# Patient Record
Sex: Female | Born: 1945
Health system: Southern US, Community
[De-identification: ages and names within clinical notes are randomized; demographics above are authoritative.]

## PROBLEM LIST (undated history)

## (undated) ENCOUNTER — Emergency Department (HOSPITAL_COMMUNITY)

## (undated) DIAGNOSIS — L723 Sebaceous cyst: Secondary | ICD-10-CM

## (undated) DIAGNOSIS — E785 Hyperlipidemia, unspecified: Secondary | ICD-10-CM

## (undated) DIAGNOSIS — K589 Irritable bowel syndrome without diarrhea: Secondary | ICD-10-CM

## (undated) DIAGNOSIS — I10 Essential (primary) hypertension: Secondary | ICD-10-CM

## (undated) DIAGNOSIS — K9049 Malabsorption due to intolerance, not elsewhere classified: Secondary | ICD-10-CM

## (undated) DIAGNOSIS — R631 Polydipsia: Secondary | ICD-10-CM

## (undated) DIAGNOSIS — G453 Amaurosis fugax: Secondary | ICD-10-CM

## (undated) DIAGNOSIS — R7402 Elevation of levels of lactic acid dehydrogenase (LDH): Secondary | ICD-10-CM

## (undated) DIAGNOSIS — R7401 Elevation of levels of liver transaminase levels: Secondary | ICD-10-CM

## (undated) DIAGNOSIS — N83209 Unspecified ovarian cyst, unspecified side: Secondary | ICD-10-CM

## (undated) DIAGNOSIS — K559 Vascular disorder of intestine, unspecified: Secondary | ICD-10-CM

## (undated) DIAGNOSIS — L719 Rosacea, unspecified: Secondary | ICD-10-CM

## (undated) DIAGNOSIS — R740 Nonspecific elevation of levels of transaminase and lactic acid dehydrogenase [LDH]: Secondary | ICD-10-CM

## (undated) HISTORY — DX: Irritable bowel syndrome, unspecified: K58.9

## (undated) HISTORY — DX: Elevation of levels of lactic acid dehydrogenase (LDH): R74.02

## (undated) HISTORY — DX: Essential (primary) hypertension: I10

## (undated) HISTORY — DX: Unspecified ovarian cyst, unspecified side: N83.209

## (undated) HISTORY — DX: Nonspecific elevation of levels of transaminase and lactic acid dehydrogenase (ldh): R74.0

## (undated) HISTORY — DX: Polydipsia: R63.1

## (undated) HISTORY — PX: COLONOSCOPY: SHX174

## (undated) HISTORY — DX: Elevation of levels of liver transaminase levels: R74.01

## (undated) HISTORY — DX: Malabsorption due to intolerance, not elsewhere classified: K90.49

## (undated) HISTORY — DX: Vascular disorder of intestine, unspecified: K55.9

## (undated) HISTORY — DX: Hyperlipidemia, unspecified: E78.5

## (undated) HISTORY — DX: Rosacea, unspecified: L71.9

## (undated) HISTORY — DX: Amaurosis fugax: G45.3

## (undated) HISTORY — PX: TONSILLECTOMY AND ADENOIDECTOMY: SUR1326

## (undated) HISTORY — DX: Sebaceous cyst: L72.3

## (undated) HISTORY — DX: Elevation of levels of liver transaminase levels: R74.02

---

## 1978-01-04 HISTORY — PX: OTHER SURGICAL HISTORY: SHX169

## 1978-01-04 HISTORY — PX: APPENDECTOMY: SHX54

## 1984-01-05 HISTORY — PX: UPPER GASTROINTESTINAL ENDOSCOPY: SHX188

## 1998-11-11 ENCOUNTER — Other Ambulatory Visit: Admission: RE | Admit: 1998-11-11 | Discharge: 1998-11-11 | Payer: Self-pay | Admitting: Obstetrics and Gynecology

## 1999-05-13 ENCOUNTER — Encounter: Payer: Self-pay | Admitting: Obstetrics and Gynecology

## 1999-05-13 ENCOUNTER — Encounter: Admission: RE | Admit: 1999-05-13 | Discharge: 1999-05-13 | Payer: Self-pay | Admitting: Obstetrics and Gynecology

## 1999-12-21 ENCOUNTER — Other Ambulatory Visit: Admission: RE | Admit: 1999-12-21 | Discharge: 1999-12-21 | Payer: Self-pay | Admitting: Obstetrics and Gynecology

## 2000-08-08 ENCOUNTER — Encounter: Payer: Self-pay | Admitting: Internal Medicine

## 2000-08-08 ENCOUNTER — Encounter: Admission: RE | Admit: 2000-08-08 | Discharge: 2000-08-08 | Payer: Self-pay | Admitting: Internal Medicine

## 2000-10-03 ENCOUNTER — Ambulatory Visit (HOSPITAL_COMMUNITY): Admission: RE | Admit: 2000-10-03 | Discharge: 2000-10-03 | Payer: Self-pay | Admitting: Neurology

## 2000-10-03 ENCOUNTER — Encounter: Payer: Self-pay | Admitting: Neurology

## 2001-02-06 ENCOUNTER — Other Ambulatory Visit: Admission: RE | Admit: 2001-02-06 | Discharge: 2001-02-06 | Payer: Self-pay | Admitting: Obstetrics and Gynecology

## 2001-10-05 ENCOUNTER — Ambulatory Visit (HOSPITAL_COMMUNITY): Admission: RE | Admit: 2001-10-05 | Discharge: 2001-10-05 | Payer: Self-pay | Admitting: Gastroenterology

## 2002-02-04 ENCOUNTER — Encounter (INDEPENDENT_AMBULATORY_CARE_PROVIDER_SITE_OTHER): Payer: Self-pay | Admitting: *Deleted

## 2002-08-06 ENCOUNTER — Encounter: Payer: Self-pay | Admitting: Internal Medicine

## 2003-06-07 ENCOUNTER — Encounter: Payer: Self-pay | Admitting: Internal Medicine

## 2003-11-12 ENCOUNTER — Ambulatory Visit: Payer: Self-pay | Admitting: Internal Medicine

## 2003-11-27 ENCOUNTER — Encounter: Admission: RE | Admit: 2003-11-27 | Discharge: 2003-11-27 | Payer: Self-pay | Admitting: Internal Medicine

## 2004-03-27 ENCOUNTER — Ambulatory Visit: Payer: Self-pay | Admitting: Internal Medicine

## 2004-04-15 ENCOUNTER — Ambulatory Visit: Payer: Self-pay | Admitting: Internal Medicine

## 2004-08-19 ENCOUNTER — Ambulatory Visit: Payer: Self-pay | Admitting: Internal Medicine

## 2004-08-20 ENCOUNTER — Ambulatory Visit: Payer: Self-pay | Admitting: Internal Medicine

## 2005-04-05 ENCOUNTER — Ambulatory Visit: Payer: Self-pay | Admitting: Internal Medicine

## 2005-04-07 ENCOUNTER — Ambulatory Visit: Payer: Self-pay | Admitting: Internal Medicine

## 2005-04-12 ENCOUNTER — Ambulatory Visit: Payer: Self-pay | Admitting: Internal Medicine

## 2005-04-20 ENCOUNTER — Ambulatory Visit: Payer: Self-pay | Admitting: Gastroenterology

## 2005-11-24 ENCOUNTER — Ambulatory Visit: Payer: Self-pay | Admitting: Internal Medicine

## 2005-12-29 ENCOUNTER — Ambulatory Visit: Payer: Self-pay | Admitting: Internal Medicine

## 2006-06-07 ENCOUNTER — Encounter: Payer: Self-pay | Admitting: Internal Medicine

## 2006-09-19 ENCOUNTER — Encounter: Payer: Self-pay | Admitting: Family Medicine

## 2006-11-30 ENCOUNTER — Ambulatory Visit: Payer: Self-pay | Admitting: Internal Medicine

## 2006-11-30 DIAGNOSIS — T887XXA Unspecified adverse effect of drug or medicament, initial encounter: Secondary | ICD-10-CM | POA: Insufficient documentation

## 2006-11-30 DIAGNOSIS — L719 Rosacea, unspecified: Secondary | ICD-10-CM | POA: Insufficient documentation

## 2006-11-30 DIAGNOSIS — H3400 Transient retinal artery occlusion, unspecified eye: Secondary | ICD-10-CM | POA: Insufficient documentation

## 2006-11-30 DIAGNOSIS — R740 Nonspecific elevation of levels of transaminase and lactic acid dehydrogenase [LDH]: Secondary | ICD-10-CM

## 2006-11-30 DIAGNOSIS — N83209 Unspecified ovarian cyst, unspecified side: Secondary | ICD-10-CM

## 2006-11-30 DIAGNOSIS — L608 Other nail disorders: Secondary | ICD-10-CM | POA: Insufficient documentation

## 2006-11-30 DIAGNOSIS — K589 Irritable bowel syndrome without diarrhea: Secondary | ICD-10-CM

## 2006-11-30 DIAGNOSIS — I10 Essential (primary) hypertension: Secondary | ICD-10-CM

## 2006-11-30 DIAGNOSIS — R631 Polydipsia: Secondary | ICD-10-CM

## 2006-12-10 LAB — CONVERTED CEMR LAB
ALT: 27 units/L (ref 0–35)
AST: 27 units/L (ref 0–37)
Albumin: 3.8 g/dL (ref 3.5–5.2)
Alkaline Phosphatase: 77 units/L (ref 39–117)
BUN: 10 mg/dL (ref 6–23)
Bilirubin, Direct: 0.1 mg/dL (ref 0.0–0.3)
Cholesterol: 241 mg/dL (ref 0–200)
Creatinine, Ser: 0.8 mg/dL (ref 0.4–1.2)
Direct LDL: 162.7 mg/dL
HDL: 70.8 mg/dL (ref >39.0)
Hgb A1c MFr Bld: 5.5 % (ref 4.6–6.0)
Potassium: 4.1 meq/L (ref 3.5–5.1)
TSH: 1.79 microintl units/mL (ref 0.35–5.50)
Total Bilirubin: 0.7 mg/dL (ref 0.3–1.2)
Total CHOL/HDL Ratio: 3.4
Total Protein: 6.9 g/dL (ref 6.0–8.3)
Triglycerides: 61 mg/dL (ref 0–149)
VLDL: 12 mg/dL (ref 0–40)

## 2006-12-12 ENCOUNTER — Encounter (INDEPENDENT_AMBULATORY_CARE_PROVIDER_SITE_OTHER): Payer: Self-pay | Admitting: *Deleted

## 2006-12-13 ENCOUNTER — Encounter (INDEPENDENT_AMBULATORY_CARE_PROVIDER_SITE_OTHER): Payer: Self-pay | Admitting: *Deleted

## 2006-12-13 DIAGNOSIS — E785 Hyperlipidemia, unspecified: Secondary | ICD-10-CM

## 2006-12-13 DIAGNOSIS — E7800 Pure hypercholesterolemia, unspecified: Secondary | ICD-10-CM | POA: Insufficient documentation

## 2007-01-27 ENCOUNTER — Emergency Department (HOSPITAL_COMMUNITY): Admission: EM | Admit: 2007-01-27 | Discharge: 2007-01-27 | Payer: Self-pay | Admitting: Emergency Medicine

## 2007-04-21 ENCOUNTER — Ambulatory Visit: Payer: Self-pay | Admitting: Internal Medicine

## 2007-04-26 LAB — CONVERTED CEMR LAB
Albumin: 3.9 g/dL (ref 3.5–5.2)
Alkaline Phosphatase: 72 units/L (ref 39–117)
BUN: 8 mg/dL (ref 6–23)
Calcium: 9.3 mg/dL (ref 8.4–10.5)
Cholesterol: 226 mg/dL (ref 0–200)
Creatinine, Ser: 0.8 mg/dL (ref 0.4–1.2)
Direct LDL: 145.6 mg/dL
Eosinophils Absolute: 0.1 10*3/uL (ref 0.0–0.7)
Eosinophils Relative: 2.1 % (ref 0.0–5.0)
GFR calc Af Amer: 94 mL/min
GFR calc non Af Amer: 78 mL/min
Glucose, Bld: 85 mg/dL (ref 70–99)
HCT: 41.1 % (ref 36.0–46.0)
Hemoglobin: 14.2 g/dL (ref 12.0–15.0)
MCV: 90.7 fL (ref 78.0–100.0)
Monocytes Absolute: 0.4 10*3/uL (ref 0.1–1.0)
Monocytes Relative: 7.5 % (ref 3.0–12.0)
Neutro Abs: 3.1 10*3/uL (ref 1.4–7.7)
Platelets: 232 10*3/uL (ref 150–400)
Potassium: 4.1 meq/L (ref 3.5–5.1)
TSH: 2.6 microintl units/mL (ref 0.35–5.50)
Total Protein: 6.8 g/dL (ref 6.0–8.3)
Triglycerides: 104 mg/dL (ref 0–149)
WBC: 5.3 10*3/uL (ref 4.5–10.5)

## 2008-01-05 DIAGNOSIS — K559 Vascular disorder of intestine, unspecified: Secondary | ICD-10-CM

## 2008-01-05 HISTORY — DX: Vascular disorder of intestine, unspecified: K55.9

## 2008-06-05 ENCOUNTER — Telehealth (INDEPENDENT_AMBULATORY_CARE_PROVIDER_SITE_OTHER): Payer: Self-pay | Admitting: *Deleted

## 2008-07-19 ENCOUNTER — Ambulatory Visit: Payer: Self-pay | Admitting: Internal Medicine

## 2008-07-19 DIAGNOSIS — R9431 Abnormal electrocardiogram [ECG] [EKG]: Secondary | ICD-10-CM | POA: Insufficient documentation

## 2008-07-19 LAB — CONVERTED CEMR LAB
Cholesterol, target level: 200 mg/dL
LDL Goal: 130 mg/dL

## 2008-07-20 LAB — CONVERTED CEMR LAB
AST: 26 units/L (ref 0–37)
Albumin: 4.2 g/dL (ref 3.5–5.2)
Alkaline Phosphatase: 72 units/L (ref 39–117)
Basophils Relative: 0.5 % (ref 0.0–3.0)
CO2: 30 meq/L (ref 19–32)
Calcium: 9.2 mg/dL (ref 8.4–10.5)
Chloride: 107 meq/L (ref 96–112)
Direct LDL: 128.3 mg/dL
Eosinophils Absolute: 0.1 10*3/uL (ref 0.0–0.7)
Glucose, Bld: 82 mg/dL (ref 70–99)
HCT: 40.5 % (ref 36.0–46.0)
Hemoglobin: 13.7 g/dL (ref 12.0–15.0)
Lymphocytes Relative: 27.5 % (ref 12.0–46.0)
Lymphs Abs: 1.7 10*3/uL (ref 0.7–4.0)
MCHC: 33.9 g/dL (ref 30.0–36.0)
Neutro Abs: 3.7 10*3/uL (ref 1.4–7.7)
Potassium: 3.9 meq/L (ref 3.5–5.1)
RBC: 4.35 M/uL (ref 3.87–5.11)
RDW: 12.9 % (ref 11.5–14.6)
Sodium: 143 meq/L (ref 135–145)
TSH: 1.67 microintl units/mL (ref 0.35–5.50)
Total CHOL/HDL Ratio: 3
Total Protein: 6.9 g/dL (ref 6.0–8.3)
Triglycerides: 73 mg/dL (ref 0.0–149.0)

## 2008-07-22 ENCOUNTER — Encounter (INDEPENDENT_AMBULATORY_CARE_PROVIDER_SITE_OTHER): Payer: Self-pay | Admitting: *Deleted

## 2008-08-09 ENCOUNTER — Encounter: Admission: RE | Admit: 2008-08-09 | Discharge: 2008-08-09 | Payer: Self-pay | Admitting: Obstetrics and Gynecology

## 2008-08-22 ENCOUNTER — Encounter: Payer: Self-pay | Admitting: Internal Medicine

## 2009-01-02 ENCOUNTER — Telehealth (INDEPENDENT_AMBULATORY_CARE_PROVIDER_SITE_OTHER): Payer: Self-pay | Admitting: *Deleted

## 2009-01-02 ENCOUNTER — Ambulatory Visit: Payer: Self-pay | Admitting: Family Medicine

## 2009-01-04 ENCOUNTER — Telehealth: Payer: Self-pay | Admitting: Family Medicine

## 2009-06-12 ENCOUNTER — Ambulatory Visit: Payer: Self-pay | Admitting: Internal Medicine

## 2009-06-12 DIAGNOSIS — R635 Abnormal weight gain: Secondary | ICD-10-CM | POA: Insufficient documentation

## 2009-07-09 ENCOUNTER — Telehealth: Payer: Self-pay | Admitting: Internal Medicine

## 2009-07-10 ENCOUNTER — Telehealth: Payer: Self-pay | Admitting: Internal Medicine

## 2009-07-15 ENCOUNTER — Encounter: Payer: Self-pay | Admitting: Internal Medicine

## 2009-07-21 ENCOUNTER — Ambulatory Visit: Payer: Self-pay | Admitting: Internal Medicine

## 2009-07-21 DIAGNOSIS — J310 Chronic rhinitis: Secondary | ICD-10-CM

## 2009-07-28 LAB — CONVERTED CEMR LAB
ALT: 22 units/L (ref 0–35)
Albumin: 4 g/dL (ref 3.5–5.2)
Basophils Relative: 1.1 % (ref 0.0–3.0)
Bilirubin, Direct: 0.1 mg/dL (ref 0.0–0.3)
CO2: 29 meq/L (ref 19–32)
Chloride: 110 meq/L (ref 96–112)
Eosinophils Relative: 3.3 % (ref 0.0–5.0)
HCT: 39.5 % (ref 36.0–46.0)
Hemoglobin: 13.7 g/dL (ref 12.0–15.0)
Hgb A1c MFr Bld: 5.5 % (ref 4.6–6.5)
Lymphs Abs: 1.4 10*3/uL (ref 0.7–4.0)
MCV: 91.7 fL (ref 78.0–100.0)
Monocytes Absolute: 0.5 10*3/uL (ref 0.1–1.0)
Neutro Abs: 2.8 10*3/uL (ref 1.4–7.7)
Potassium: 4.6 meq/L (ref 3.5–5.1)
RBC: 4.31 M/uL (ref 3.87–5.11)
TSH: 2.55 microintl units/mL (ref 0.35–5.50)
Total CHOL/HDL Ratio: 4
Total Protein: 6.6 g/dL (ref 6.0–8.3)
VLDL: 27.6 mg/dL (ref 0.0–40.0)
WBC: 5 10*3/uL (ref 4.5–10.5)

## 2009-10-22 ENCOUNTER — Telehealth: Payer: Self-pay | Admitting: Internal Medicine

## 2010-01-25 ENCOUNTER — Encounter: Payer: Self-pay | Admitting: Internal Medicine

## 2010-02-03 NOTE — Progress Notes (Signed)
  Phone Note Call from Patient Call back at [facility] Phone [phone]   Caller: Patient Summary of Call: Pt has has progressive "sinus" symptoms of nasal congestion, facial pain, purulent and bloody nasal d/c.  Seen in office 01-02-09 and given rx for Augmentin which she filled the next day.  Within 2-3 hours after taking developed nausea and vomiting and mild cramping.  Did not start the Zoloft yet.  Requesting change of Ab.  Has tolerated Z-max and Levaquin in past.  Reported allergy to sulfa and cephalosporins in past.  Will d/c Augmentin and change rx to Z-max called in to [facility] [phone]. Initial call taken by: Evelena Peat MD,  January 04, 2009 10:05 AM    New/Updated Medications: AZITHROMYCIN 250 MG TABS (AZITHROMYCIN) 2 by mouth today then one by mouth once daily for 4 days

## 2010-02-03 NOTE — Progress Notes (Signed)
Summary: Xyzal PA  Phone Note Refill Request Call back at [phone] Message from:  Pharmacy on July 10, 2009 5:00 PM  Refills Requested: Medication #1:  XYZAL 5 MG TABS 1 by mouth once daily.   Dosage confirmed as above?Dosage Confirmed   Supply Requested: 1 month PRIOR AUTH NEEDED:[phone]  Next Appointment Scheduled: JULY 18TH 2011 Initial call taken by: Lavell Islam,  July 10, 2009 5:00 PM  Follow-up for Phone Call        I called pharmacy and requested Auth paper to be faxed to [phone], spoke with [facility] and [employer]  Case ID # [id number] Follow-up by: Shonna Chock,  July 14, 2009 5:23 PM  Additional Follow-up for Phone Call Additional follow up Details #1::        Form completed and faxed, will awit ins co reply. Lucious Groves  July 15, 2009 9:26 AM      Appended Document: Xyzal PA Approved until 2012.

## 2010-02-03 NOTE — Assessment & Plan Note (Signed)
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  History of Present Illness: [facility] states she is is obese based on  CDC data; BMI must be < 24.9 to get preferred  rates. If not @ that goal must be on weight reduction program. Now on modified Atkins & calorie counting  with 10 # weight loss since 02/2009. CVE as walking 30 minqd or >.  Allergies: 1)  ! Sulfa 2)  ! * Lamisil 3)  ! * Guaifenesin 4)  ! * Verelan 5)  ! Augmentin 6)  ! Zocor 7)  ! Cephalosporins 8)  ! * Zetia  Family History: Family History Diabetes 1st degree relative sister,pgm,father,mgf Family History Lung cancer M  aunt Family History of Stroke ,dementia  pgf  2 M aunts MI; 1 M aunt CHF; all 3 M aunts MI pre 69 Father: MI @ 63, DM, dementia Mother: skin cancer, basal cell, HTN, MI/angioplasty @ 55, abnormal liver imaging Siblings: sister DM, 2 sisters obesity; 1 sister : thyroid nodules; bro: PTH nodule, prostate cancer  Review of Systems CV:  Denies chest pain or discomfort, leg cramps with exertion, palpitations, and shortness of breath with exertion.  Physical Exam  General:  well-nourished; alert,appropriate and cooperative throughout examination Lungs:  Normal respiratory effort, chest expands symmetrically. Lungs are clear to auscultation, no crackles or  wheezes. Heart:  Normal rate and regular rhythm. S1 and S2 normal without gallop, murmur, click, rub or other extra sounds. Abdomen:  Waist 43 inches Neurologic:  alert & oriented X3 and DTRs symmetrical and normal.   Skin:  Intact without suspicious lesions or rashes   Impression & Recommendations:  Problem # 1:  ABNORMAL WEIGHT GAIN (ICD-783.1)  Complete Medication List: 1)  Altace 5 Mg Tabs (Ramipril) .Marland Kitchen.. 1 by mouth two times a day 2)  Benadryl  .... As needed allergies 3)  Toprol Xl 50 Mg Tb24 (Metoprolol succinate) .Marland Kitchen.. 1 by mouth qd 4)  Excedrin Extra Strength 250-250-65 Mg Tabs (Aspirin-acetaminophen-caffeine)  Patient Instructions: 1)  Consume LESS THAN 25 grams of "sugar" / day from foods & drinks with High Fructose Corn Syrup as discussed. Complex brown carbs are preferable to hyperglycemic white carbs.

## 2010-02-03 NOTE — Progress Notes (Signed)
Summary: Kimberly Stephenson  Phone Note Call from Patient   Caller: Patient Summary of Call: PT CALLS AND SAYS THAT SHE WAS SEEN BY DR. Kamarii Buren IN JANUARY FOR BRONCHITIS. SHE WAS GIVEN SAMPLES OF XYZAL 5 MG. SHE DID NOT TAKE THE SAMPLES IN JANUARY BUT HAS TAKEN THEM OVER THE PAST 2 WEEKS AND THEY SEEM TO HELP HER WITH HER ALLERGIES. SHE IS REQUESTING A RX FOR THIS MEDICATION. PT HAS AN APPT ON JULY 18TH 2011 FOR A CPX. PLEASE ADVISE. PT CAN BE CONTACTED AT [phone]. Initial call taken by: Lavell Islam,  July 09, 2009 9:11 AM  Follow-up for Phone Call        OK # 30. Note : insurance may not cover. please check on coupon or discount card in closet. Follow-up by: Marga Melnick MD,  July 09, 2009 1:11 PM  Additional Follow-up for Phone Call Additional follow up Details #1::        Left message on VM informing patient of Dr.Jadarious Dobbins's instruction. Patient to call if discount card needed Additional Follow-up by: Shonna Chock,  July 10, 2009 4:33 PM    New/Updated Medications: XYZAL 5 MG TABS (LEVOCETIRIZINE DIHYDROCHLORIDE) 1 by mouth once daily Prescriptions: XYZAL 5 MG TABS (LEVOCETIRIZINE DIHYDROCHLORIDE) 1 by mouth once daily  #30 x 0   Entered by:   Shonna Chock   Authorized by:   Marga Melnick MD   Signed by:   Shonna Chock on 07/10/2009   Method used:   Electronically to        [facility]. [phone]* (retail)       [address]       [place], [place]  [address]       Ph: [phone]       Fax: [phone]   RxID:   [id number]

## 2010-02-03 NOTE — Progress Notes (Signed)
Summary: RX call  Phone Note From Pharmacy   Summary of Call: Pharmacy called about patient Ramipril. RX was given verbally. Initial call taken by: Lucious Groves CMA,  October 22, 2009 10:30 AM

## 2010-02-03 NOTE — Assessment & Plan Note (Signed)
Summary: cpx//pt will be fasting//lch   Vital Signs:  Patient profile:   65 year old female Height:      60 inches Weight:      164.2 pounds Temp:     98.4 degrees F oral Pulse rate:   72 / minute Resp:     14 per minute BP sitting:   110 / 70  (left arm) Cuff size:   regular  Vitals Entered By: Shonna Chock CMA (July 21, 2009 8:14 AM) CC: CPX with fasting labs , Lipid Management Comments **Patient refused to update TDaP vaccine, no vaccine on file**   Primary Care Provider:  Alwyn Ren  CC:  CPX with fasting labs  and Lipid Management.  History of Present Illness: Ms. Dardis is here for a physical; she has had nasal congestion for which she has been using Claritin -D. Risks of decongestants discussed.  Lipid Management History:      Positive NCEP/ATP III risk factors include female age 39 years old or older, family history for ischemic heart disease (females less than 5 years old), and hypertension.  Negative NCEP/ATP III risk factors include non-diabetic, HDL cholesterol greater than 60, non-tobacco-user status, no ASHD (atherosclerotic heart disease), no prior stroke/TIA, no peripheral vascular disease, and no history of aortic aneurysm.     Preventive Screening-Counseling & Management  Alcohol-Tobacco     Smoking Status: quit > 6 months     Packs/Day: 2.0     Year Started: 1969     Year Quit: 1971  Current Medications (verified): 1)  Altace 5 Mg  Tabs (Ramipril) .Marland Kitchen.. 1 By Mouth Two Times A Day 2)  Benadryl .... As Needed Allergies 3)  Toprol Xl 50 Mg  Tb24 (Metoprolol Succinate) .Marland Kitchen.. 1 By Mouth Qd 4)  Excedrin Extra Strength 250-250-65 Mg Tabs (Aspirin-Acetaminophen-Caffeine)  Allergies: 1)  ! Sulfa 2)  ! * Lamisil 3)  ! * Guaifenesin 4)  ! * Verelan 5)  ! Augmentin 6)  ! Zocor 7)  ! Cephalosporins 8)  ! * Zetia  Past History:  Past Medical History: HYPERLIPIDEMIA (ICD-272.4): [place]amingham Study LDL goal = < 130. NONSPEC ELEVATION OF LEVELS OF TRANSAMINASE/LDH  (ICD-790.4) POLYDIPSIA (ICD-783.5) OVARIAN CYST (ICD-620.2),PMH of  AMAUROSIS FUGAX (ICD-362.34) X 3, last 2001 * WHEAT INTOLERANCE IRRITABLE BOWEL SYNDROME (ICD-564.1) ROSACEA (ICD-695.3) HYPERTENSION (ICD-401.9) Ischemic Colitis 08/2009, Dr Kinnie Scales  Past Surgical History: 2 C -sections; G2 P2 T & A Peri ovarian cyst right resected & appendectomy 1980 Endoscopy : negative 1986 Colonoscopy :negative  2003,  Dr Medoff;Flex Sig 08/2008 : Ischemic Colitis   Family History:  sister,PGM ,father,MGF : DM  M  aunt:CAD,lung cancer; PGF : Stroke ,dementia  ; 2 M aunts MI pre 62; 1 M aunt CHF;  Father: MI @ 22, DM, dementia Mother:  basal cell skin cancer, HTN, MI/angioplasty @ 60, abnormal liver imaging; died of small cell lung cancer Siblings: sister: DM, 2 sisters: obesity; 1 sister : thyroid nodules; Nephew: PTH nodule, prostate cancer  Social History: Former Smoker quit 1971 Alcohol use-rarely [employer] Occupation: Runner, broadcasting/film/video Regular exercise-yes: walks 30 min daily  Packs/Day:  2.0 Smoking Status:  quit > 6 months  Review of Systems  The patient denies anorexia, fever, weight loss, weight gain, vision loss, decreased hearing, chest pain, syncope, dyspnea on exertion, peripheral edema, abdominal pain, melena, hematochezia, severe indigestion/heartburn, hematuria, incontinence, suspicious skin lesions, depression, unusual weight change, abnormal bleeding, enlarged lymph nodes, and angioedema.         Occasional frontally usually  in am ; Excedrin helps . ENT:  Complains of nasal congestion and postnasal drainage; Hoarseness from PNDrainage; PND is perennial. Resp:  Complains of cough; denies shortness of breath and wheezing; Sputum from PNDrainage. Allergy:  Complains of itching eyes, seasonal allergies, and sneezing; Ragweed issues in Fall.  Physical Exam  General:  well-nourished; alert,appropriate and cooperative throughout examination Head:  Normocephalic and atraumatic  without obvious abnormalities.  Eyes:  No corneal or conjunctival inflammation noted. Perrla. Funduscopic exam benign, without hemorrhages, exudates or papilledema. Ears:  External ear exam shows no significant lesions or deformities.  Otoscopic examination reveals clear canals, tympanic membranes are intact bilaterally without bulging, retraction, inflammation or discharge. Hearing is grossly normal bilaterally. Nose:  External nasal examination shows no deformity or inflammation. Nasal mucosa are pink and moist without lesions or exudates. Mouth:  Oral mucosa and oropharynx without lesions or exudates.  Teeth in good repair. Neck:  No deformities, masses, or tenderness noted. Lungs:  Normal respiratory effort, chest expands symmetrically. Lungs are clear to auscultation, no crackles or wheezes. Heart:  Normal rate and regular rhythm. S1 and S2 normal without gallop, murmur, click, rub .S4 Abdomen:  Bowel sounds positive,abdomen soft and non-tender without masses, organomegaly or hernias noted. Msk:  No deformity or scoliosis noted of thoracic or lumbar spine.   Pulses:  R and L carotid,radial,dorsalis pedis and posterior tibial pulses are full and equal bilaterally Extremities:  No clubbing, cyanosis, edema, or deformity noted with normal full range of motion of all joints.   Neurologic:  alert & oriented X3 and DTRs symmetrical and normal.   Skin:  Intact without suspicious lesions or rashes Cervical Nodes:  No lymphadenopathy noted Axillary Nodes:  No palpable lymphadenopathy Psych:  memory intact for recent and remote, normally interactive, good eye contact, not anxious appearing, and not depressed appearing.     Impression & Recommendations:  Problem # 1:  ROUTINE GENERAL MEDICAL EXAM@HEALTH  CARE FACL (ICD-V70.0)  Orders: EKG w/ Interpretation (93000) Venipuncture (91478) TLB-Lipid Panel (80061-LIPID) TLB-BMP (Basic Metabolic Panel-BMET) (80048-METABOL) TLB-CBC Platelet -  w/Differential (85025-CBCD) TLB-Hepatic/Liver Function Pnl (80076-HEPATIC) TLB-TSH (Thyroid Stimulating Hormone) (84443-TSH) TLB-A1C / Hgb A1C (Glycohemoglobin) (83036-A1C)  Problem # 2:  RHINITIS (ICD-472.0)  Both seasonal & perennial  Orders: Venipuncture (29562)  Problem # 3:  HYPERLIPIDEMIA (ICD-272.4)  Orders: Venipuncture (13086) TLB-Lipid Panel (80061-LIPID)  Problem # 4:  HYPERTENSION (ICD-401.9)  Her updated medication list for this problem includes:    Altace 5 Mg Tabs (Ramipril) .Marland Kitchen... 1 by mouth two times a day    Toprol Xl 50 Mg Tb24 (Metoprolol succinate) .Marland Kitchen... 1 by mouth qd  Orders: EKG w/ Interpretation (93000) Venipuncture (57846)  Complete Medication List: 1)  Altace 5 Mg Tabs (Ramipril) .Marland Kitchen.. 1 by mouth two times a day 2)  Benadryl  .... As needed allergies 3)  Toprol Xl 50 Mg Tb24 (Metoprolol succinate) .Marland Kitchen.. 1 by mouth qd 4)  Excedrin Extra Strength 250-250-65 Mg Tabs (Aspirin-acetaminophen-caffeine)  Lipid Assessment/Plan:      Based on NCEP/ATP III, the patient's risk factor category is "2 or more risk factors and a calculated 10 year CAD risk of > 20%".  The patient's lipid goals are as follows: Total cholesterol goal is 200; LDL cholesterol goal is 130; HDL cholesterol goal is 40; Triglyceride goal is 150.  Her LDL cholesterol goal has been met.    Patient Instructions: 1)  It is important that you exercise regularly at least 20 minutes 5 times a week. If you develop chest  pain, have severe difficulty breathing, or feel very tired , stop exercising immediately and seek medical attention.Take an 81 mg  coated Aspirin every day.Check your Blood Pressure regularly. If it is above:135/85 ON AVERAGE you should make an appointment. Neti pot once daily as needed for nasal congestion. Prescriptions: TOPROL XL 50 MG  TB24 (METOPROLOL SUCCINATE) 1 by mouth qd  #90 x 3   Entered and Authorized by:   Marga Melnick MD   Signed by:   Marga Melnick MD on  07/21/2009   Method used:   Print then Give to Patient   RxID:   [id number] ALTACE 5 MG  TABS (RAMIPRIL) 1 by mouth two times a day  #90 x 3   Entered and Authorized by:   Marga Melnick MD   Signed by:   Marga Melnick MD on 07/21/2009   Method used:   Print then Give to Patient   RxID:   [id number]     Appended Document: cpx//pt will be fasting//lch     Clinical Lists Changes  Orders: Added new Service order of Specimen Handling (32440) - Signed

## 2010-02-03 NOTE — Medication Information (Signed)
Summary: Approval for Levocetirizine/[facility]  Approval for Levocetirizine/[employer]   Imported By: Lanelle Bal 07/29/2009 09:14:58  _____________________________________________________________________  External Attachment:    Type:   Image     Comment:   External Document

## 2010-02-03 NOTE — Medication Information (Signed)
Summary: Prior Authorization for Levocetirizine Dihydro/[facility]  Prior Authorization for Levocetirizine Dihydro/[facility]   Imported By: Lanelle Bal 07/21/2009 13:38:08  _____________________________________________________________________  External Attachment:    Type:   Image     Comment:   External Document

## 2010-05-19 ENCOUNTER — Ambulatory Visit: Payer: Self-pay | Admitting: Internal Medicine

## 2010-05-23 ENCOUNTER — Other Ambulatory Visit: Payer: Self-pay | Admitting: Internal Medicine

## 2010-06-20 ENCOUNTER — Encounter: Payer: Self-pay | Admitting: Internal Medicine

## 2010-06-20 ENCOUNTER — Ambulatory Visit (INDEPENDENT_AMBULATORY_CARE_PROVIDER_SITE_OTHER): Payer: BC Managed Care – PPO | Admitting: Family Medicine

## 2010-06-20 ENCOUNTER — Encounter: Payer: Self-pay | Admitting: Family Medicine

## 2010-06-20 DIAGNOSIS — R040 Epistaxis: Secondary | ICD-10-CM | POA: Insufficient documentation

## 2010-06-20 NOTE — Patient Instructions (Addendum)
When you use nasal saline - cut the salt component down by 1/2  Aspirin does cause you to bleed more easily  Use a bit of vaseline in nostrils 1-2 times per day to help heal the sore in left nostril and keep nostrils moist  If the nosebleeds continue or other symptoms - follow up with your regular doctor For a nosebleed- pinch the nose septum firmly for 15 minutes without letting go -- and then if still bleeding after 30 minutes - go to the ER

## 2010-06-20 NOTE — Assessment & Plan Note (Signed)
Suspect from a L nasal septum abrasion with use of nasal saline  Also on asa Disc what to do for nosebleed Adv to use a bit of vaseline to nares bid  If recurrent follow up

## 2010-06-20 NOTE — Progress Notes (Signed)
Subjective:    Patient ID: Kimberly Stephenson, female    DOB: 1945-09-06, 65 y.o.   MRN: [id number]  HPI Had a nosebleed 2 nights ago Woken up with nosebleed at 4:30 in am -- has never had one except when pregnant  Many years ago - had L nasal cauterization This was also on L side   Last on abx over a year ago - on levaquin  Has had chronic sinus problems ever since  Better since the end of the school year  Used to blow out yellow mucous - worse on L   Started using salt water nose spray -- for 2 days before the nosebleed  Sinuses are better  Is not taking any all meds now -- used xyzal this spring   bp is very good   Daughter is OBGYN -- she was worried about clotting -- suggested a coag work up  Is currently on 325 mg aspirin --- or exedrin   Had hx of amarosis fugax (saw Dr Dione Booze)  Big work up   Patient Active Problem List  Diagnoses  . HYPERLIPIDEMIA  . AMAUROSIS FUGAX  . HYPERTENSION  . RHINITIS  . IRRITABLE BOWEL SYNDROME  . OVARIAN CYST  . ROSACEA  . OTHER SPECIFIED DISEASE OF NAIL  . ABNORMAL WEIGHT GAIN  . POLYDIPSIA  . NONSPEC ELEVATION OF LEVELS OF TRANSAMINASE/LDH  . NONSPECIFIC ABNORMAL ELECTROCARDIOGRAM  . UNS ADVRS EFF UNS RX MEDICINAL&BIOLOGICAL SBSTNC  . Nosebleed   Past Medical History  Diagnosis Date  . Other and unspecified hyperlipidemia     [place]ram[place] study LDL goal=<130  . Nonspecific elevation of levels of transaminase or lactic acid dehydrogenase (LDH)   . Polydipsia   . Other and unspecified ovarian cyst     PMHx  . Transient arterial occlusion of retina     x 3 ; last 2001  . Irritable bowel syndrome   . Rosacea   . Unspecified essential hypertension   . Colitis, ischemic 8/11    Dr. Troy Sine  . Wheat intolerance    Past Surgical History  Procedure Date  . Cesarean section     x 2; G2 P2  . Tonsillectomy and adenoidectomy   . Peri ovarian cyst right resected 1980  . Appendectomy 1980   History  Substance Use Topics  .  Smoking status: Former Smoker    Quit date: 01/04/1969  . Smokeless tobacco: Not on file  . Alcohol Use: Yes     Rarely   Family History  Problem Relation Age of Onset  . Diabetes Sister   . Diabetes Father   . Diabetes Maternal Grandfather   . Coronary artery disease Maternal Aunt   . Lung cancer Maternal Aunt   . Stroke Paternal Grandfather   . Dementia Paternal Grandfather   . Heart attack Maternal Aunt   . Heart failure Maternal Aunt   . Dementia Father   . Basal cell carcinoma Mother   . Hypertension Mother   . Hypertension Mother 41    Angioplasty  . Lung cancer Mother     Small cell  . Obesity Sister     x 2  . Thyroid nodules      Nephew  . Prostate cancer Sister   . Other Sister     PTH nodule   Allergies  Allergen Reactions  . Cephalosporins   . Ezetimibe   . Guaifenesin   . NGE:XBMWUXLKGMW+NUUVOZDGU+YQIHKVQQVZ Acid+Aspartame   . Simvastatin   . Sulfonamide Derivatives  Current Outpatient Prescriptions on File Prior to Visit  Medication Sig Dispense Refill  . ramipril (ALTACE) 5 MG capsule take 1 capsule by mouth twice a day  90 capsule  0     Review of Systems Review of Systems  Constitutional: Negative for fever, appetite change, fatigue and unexpected weight change.  Eyes: Negative for pain and visual disturbance.  Respiratory: Negative for cough and shortness of breath.   Cardiovascular: Negative.  for cp or palpitations Gastrointestinal: Negative for nausea, diarrhea and constipation.  Genitourinary: Negative for urgency and frequency.  Skin: Negative for pallor.  Neurological: Negative for weakness, light-headedness, numbness and headaches.  Hematological: Negative for adenopathy. Does not bruise/bleed easily.  Psychiatric/Behavioral: Negative for dysphoric mood. The patient is not nervous/anxious.          Objective:   Physical Exam  Constitutional: She appears well-developed and well-nourished. No distress.  HENT:  Head:  Normocephalic and atraumatic.  Right Ear: External ear normal.  Left Ear: External ear normal.  Mouth/Throat: Oropharynx is clear and moist.       Nares are mildly dry  L septum has healed scab on it   Eyes: Conjunctivae and EOM are normal. Pupils are equal, round, and reactive to light. Right eye exhibits no discharge. Left eye exhibits no discharge.  Neck: Normal range of motion. Neck supple. No JVD present. Carotid bruit is not present.  Cardiovascular: Normal rate, regular rhythm and normal heart sounds.   Pulmonary/Chest: Effort normal and breath sounds normal. No respiratory distress.  Lymphadenopathy:    She has no cervical adenopathy.  Neurological: She is alert. She has normal reflexes.  Skin: Skin is warm and dry. No rash noted. No erythema. No pallor.       No ecchymoses noted   Psychiatric:       Pt seems mildly anxious          Assessment & Plan:

## 2010-08-20 ENCOUNTER — Encounter: Payer: Self-pay | Admitting: Internal Medicine

## 2010-08-20 ENCOUNTER — Ambulatory Visit (INDEPENDENT_AMBULATORY_CARE_PROVIDER_SITE_OTHER): Payer: BC Managed Care – PPO | Admitting: Internal Medicine

## 2010-08-20 DIAGNOSIS — J302 Other seasonal allergic rhinitis: Secondary | ICD-10-CM

## 2010-08-20 DIAGNOSIS — J309 Allergic rhinitis, unspecified: Secondary | ICD-10-CM

## 2010-08-20 DIAGNOSIS — K589 Irritable bowel syndrome without diarrhea: Secondary | ICD-10-CM

## 2010-08-20 DIAGNOSIS — E785 Hyperlipidemia, unspecified: Secondary | ICD-10-CM

## 2010-08-20 DIAGNOSIS — I10 Essential (primary) hypertension: Secondary | ICD-10-CM

## 2010-08-20 DIAGNOSIS — Z0000 Encounter for general adult medical examination without abnormal findings: Secondary | ICD-10-CM

## 2010-08-20 MED ORDER — LEVOCETIRIZINE DIHYDROCHLORIDE 5 MG PO TABS: 5.0000 mg | ORAL_TABLET | Freq: Every evening | ORAL | Status: DC

## 2010-08-20 MED ORDER — METOPROLOL SUCCINATE ER 50 MG PO TB24: 50.0000 mg | ORAL_TABLET | Freq: Every day | ORAL | Status: DC

## 2010-08-20 MED ORDER — CILIDINIUM-CHLORDIAZEPOXIDE 2.5-5 MG PO CAPS: 1.0000 | ORAL_CAPSULE | ORAL | Status: DC

## 2010-08-20 NOTE — Progress Notes (Signed)
  Subjective:    Patient ID: Kimberly Stephenson, female    DOB: [birth year], 65 y.o.   MRN: [id number]  HPI  Kimberly Stephenson  is here for a physical;she has no acute issues.     Review of Systems #1 HYPERTENSION: Disease Monitoring: Blood pressure range-not checked  Chest pain, palpitations- no       Dyspnea- no Medications: Compliance- yes  Lightheadedness,Syncope- no    Edema- no   ZHY:QMVHQION/GEXBMW/UXLKGM- no       Visual problems- no Abd pain, bowel changes- occasional    Loose to watery stool  Muscle aches- no             Objective:   Physical Exam Gen.: Healthy and well-nourished in appearance. Alert, appropriate and cooperative throughout exam. Head: Normocephalic without obvious abnormalities Eyes: No corneal or conjunctival inflammation noted. Pupils equal round reactive to light and accommodation. Fundal exam is benign without hemorrhages, exudate, papilledema. Extraocular motion intact. Vision grossly normal with lenses. Ears: External  ear exam reveals no significant lesions or deformities. Canals clear .TMs normal. Hearing is grossly normal bilaterally. Nose: External nasal exam reveals no deformity or inflammation. Nasal mucosa are pink and moist. No lesions or exudates noted. Septum normal Mouth: Oral mucosa and oropharynx reveal no lesions or exudates. Teeth in good repair. Neck: No deformities, masses, or tenderness noted. Range of motion & . Thyroid normal. Lungs: Normal respiratory effort; chest expands symmetrically. Lungs are clear to auscultation without rales, wheezes, or increased work of breathing. Heart: Normal rate and rhythm. Normal S1 and S2. No gallop, click, or rub. S4 w/o  murmur. Abdomen: Bowel sounds normal; abdomen soft and nontender. No masses, organomegaly or hernias noted. Genitalia: Dr Kimberly Stephenson   .                                                                                   Musculoskeletal/extremities: No deformity or scoliosis noted of  the  thoracic or lumbar spine. No clubbing, cyanosis, edema, or deformity noted. Range of motion  normal .Tone & strength  normal.Joints normal. Nail health  good. Vascular: Carotid, radial artery, dorsalis pedis and  posterior tibial pulses are full and equal. No bruits present. Neurologic: Alert and oriented x3. Deep tendon reflexes symmetrical and normal.         Skin: Intact without suspicious lesions or rashes. Bruise R hand Lymph: No cervical, axillary  lymphadenopathy present. Psych: Mood and affect are normal. Normally interactive                                                                                        Assessment & Plan:  #1 comprehensive physical exam; no acute findings #2 see Problem List with Assessments & Recommendations Plan: see Orders

## 2010-08-20 NOTE — Patient Instructions (Signed)
Preventive Health Care: Exercise  30-45  minutes a day, 3-4 days a week.  Eat a low-fat diet with lots of fruits and vegetables, up to 7-9 servings per day. Consume less than 30 grams of sugar per day from foods & drinks with High Fructose Corn Syrup as #1,2,3 or #4 on label. Eye Doctor - have an eye exam @ least annually Health Care Power of Attorney & Living Will place you in charge of your health care  decisions. Verify these are  in place. Recommended lifestyle interventions for Osteoporosis include calcium 600 mg twice a day  & vitamin D3 supplementation to keep vit D  level @ least 40-60. The usual vitamin D3 dose is 1000 IU daily; but individual dose is determined by annual vitamin D level monitor if Osteopenia were present.. Also weight bearing exercise such as  walking 30-45 minutes 3-4  X per week is recommended.   Please  schedule fasting Labs : BMET,Lipids, hepatic panel, CBC & dif, TSH(V70.0).

## 2010-08-25 ENCOUNTER — Other Ambulatory Visit: Payer: Self-pay | Admitting: Internal Medicine

## 2010-08-25 ENCOUNTER — Other Ambulatory Visit (INDEPENDENT_AMBULATORY_CARE_PROVIDER_SITE_OTHER): Payer: BC Managed Care – PPO

## 2010-08-25 ENCOUNTER — Encounter: Payer: Self-pay | Admitting: Internal Medicine

## 2010-08-25 DIAGNOSIS — E785 Hyperlipidemia, unspecified: Secondary | ICD-10-CM

## 2010-08-25 DIAGNOSIS — I10 Essential (primary) hypertension: Secondary | ICD-10-CM

## 2010-08-25 DIAGNOSIS — Z0000 Encounter for general adult medical examination without abnormal findings: Secondary | ICD-10-CM

## 2010-08-25 LAB — HEPATIC FUNCTION PANEL
ALT: 21 U/L (ref 0–35)
AST: 21 U/L (ref 0–37)
Albumin: 4 g/dL (ref 3.5–5.2)
Total Bilirubin: 0.7 mg/dL (ref 0.3–1.2)
Total Protein: 6.5 g/dL (ref 6.0–8.3)

## 2010-08-25 LAB — LDL CHOLESTEROL, DIRECT: Direct LDL: 145.7 mg/dL

## 2010-08-25 LAB — CBC WITH DIFFERENTIAL/PLATELET
Basophils Relative: 1.2 % (ref 0.0–3.0)
Eosinophils Relative: 4.2 % (ref 0.0–5.0)
Lymphocytes Relative: 33.8 % (ref 12.0–46.0)
MCV: 91.2 fl (ref 78.0–100.0)
Monocytes Absolute: 0.5 10*3/uL (ref 0.1–1.0)
Monocytes Relative: 9.8 % (ref 3.0–12.0)
Neutrophils Relative %: 51 % (ref 43.0–77.0)
Platelets: 216 10*3/uL (ref 150.0–400.0)
RBC: 4.34 Mil/uL (ref 3.87–5.11)
WBC: 4.7 10*3/uL (ref 4.5–10.5)

## 2010-08-25 LAB — LIPID PANEL
Cholesterol: 220 mg/dL — ABNORMAL HIGH (ref 0–200)
HDL: 62.4 mg/dL (ref >39.00)
Triglycerides: 59 mg/dL (ref 0.0–149.0)

## 2010-08-25 LAB — BASIC METABOLIC PANEL
CO2: 27 mEq/L (ref 19–32)
Calcium: 9 mg/dL (ref 8.4–10.5)
Creatinine, Ser: 0.7 mg/dL (ref 0.4–1.2)
Sodium: 142 mEq/L (ref 135–145)

## 2010-08-25 LAB — TSH: TSH: 3.87 u[IU]/mL (ref 0.35–5.50)

## 2010-09-02 ENCOUNTER — Telehealth: Payer: Self-pay | Admitting: *Deleted

## 2010-09-02 NOTE — Telephone Encounter (Signed)
Medication has been approved from 08/10/10-08/31/11

## 2010-09-25 LAB — WOUND CULTURE

## 2010-10-19 ENCOUNTER — Ambulatory Visit: Payer: BC Managed Care – PPO | Admitting: Internal Medicine

## 2010-10-21 ENCOUNTER — Encounter: Payer: Self-pay | Admitting: Internal Medicine

## 2010-10-21 ENCOUNTER — Ambulatory Visit (INDEPENDENT_AMBULATORY_CARE_PROVIDER_SITE_OTHER): Payer: BC Managed Care – PPO | Admitting: Internal Medicine

## 2010-10-21 VITALS — BP 116/70 | HR 89 | Temp 98.7°F | Wt 159.0 lb

## 2010-10-21 DIAGNOSIS — J069 Acute upper respiratory infection, unspecified: Secondary | ICD-10-CM

## 2010-10-21 DIAGNOSIS — J209 Acute bronchitis, unspecified: Secondary | ICD-10-CM

## 2010-10-21 MED ORDER — AZITHROMYCIN 250 MG PO TABS: ORAL_TABLET | ORAL | Status: AC

## 2010-10-21 NOTE — Progress Notes (Signed)
  Subjective:    Patient ID: Kimberly Stephenson, female    DOB: 1945-12-02, 65 y.o.   MRN: [id number]  HPI Respiratory tract infection Onset/symptoms:onset 10/12 as ST Exposures (illness/environmental/extrinsic):sick students & weather  front Progression of symptoms:to fever 10/13 Treatments/response:Guaifensin, ASA, Excedrin Present symptoms: Fever/chills/sweats:temp low grade Frontal headache:no Facial pain:no Nasal purulence:yellow Dental pain:no Lymphadenopathy: ? Wheezing/shortness of breath:SOB from hacky cough Cough/sputum/hemoptysis:some yellow Associated extrinsic/allergic symptoms:itchy eyes/ sneezing:no Past medical history: Seasonal allergies: yes, on Xyzal/asthma: remotely X 2 from cold air Smoking history:quit 1971           Review of Systems     Objective:   Physical Exam General appearance is of good health and nourishment; no acute distress or increased work of breathing is present.  No  lymphadenopathy about the head, neck, or axilla noted.   Eyes: No conjunctival inflammation or lid edema is present.   Ears:  External ear exam shows no significant lesions or deformities.  Otoscopic examination reveals clear canals, tympanic membranes are intact bilaterally without bulging, retraction, inflammation or discharge.  Nose:  External nasal examination shows no deformity or inflammation. Nasal mucosa are dry without lesions or exudates. No septal dislocation .No obstruction to airflow.  R nare smaller than L. Slight hyponasal speech  Oral exam: Dental hygiene is good; lips and gums are healthy appearing.There is no oropharyngeal erythema or exudate noted.   Neck:  No deformities, thyromegaly, masses, or tenderness noted.   Supple with full range of motion without pain.   Heart:  Normal rate and regular rhythm. S1 and S2 normal without gallop, murmur, click, rub S 4 Lungs:Chest clear to auscultation; no wheezes, rhonchi,rales ,or rubs present.No increased work of  breathing.    Extremities:  No cyanosis, edema, or clubbing  noted    Skin: Warm & dry           Assessment & Plan:  #1 upper respiratory tract infection with some purulent secretions  #2 cough with purulence  Plan: See orders and recommendations.

## 2010-10-21 NOTE — Patient Instructions (Addendum)
Plain Mucinex for thick secretions ;force NON dairy fluids for next 48 hrs. Use a Neti pot daily as needed for sinus congestion Use Eucerin   twice a day  for the drying.  Zicam Melts or Zinc lozenges ; vitamin C 2000 mg daily; & Echinacea for 4-7 days. Report fever, exudate("pus") or progressive pain.

## 2010-11-04 ENCOUNTER — Telehealth: Payer: Self-pay | Admitting: Internal Medicine

## 2010-11-04 NOTE — Telephone Encounter (Signed)
Patient called last week wanted another z-pac - was told to wait until she finished 1st zpac - patient not any better still coughing - has pain left side tooth  Dentist said if could be sinus infection- wants to know if another z -pac can be called in - [facility]

## 2010-11-04 NOTE — Telephone Encounter (Signed)
Z-Pak x1; take one pill daily x6 days. Do not take 2 the first day

## 2010-11-05 MED ORDER — AZITHROMYCIN 250 MG PO TABS: ORAL_TABLET | ORAL | Status: AC

## 2010-11-05 NOTE — Telephone Encounter (Signed)
Done

## 2010-11-16 ENCOUNTER — Other Ambulatory Visit: Payer: Self-pay | Admitting: Internal Medicine

## 2011-01-21 ENCOUNTER — Ambulatory Visit (INDEPENDENT_AMBULATORY_CARE_PROVIDER_SITE_OTHER): Payer: BC Managed Care – PPO | Admitting: General Surgery

## 2011-01-21 ENCOUNTER — Telehealth (INDEPENDENT_AMBULATORY_CARE_PROVIDER_SITE_OTHER): Payer: Self-pay | Admitting: General Surgery

## 2011-01-21 ENCOUNTER — Encounter (INDEPENDENT_AMBULATORY_CARE_PROVIDER_SITE_OTHER): Payer: Self-pay | Admitting: General Surgery

## 2011-01-21 VITALS — BP 120/64 | HR 70 | Temp 97.4°F | Resp 14 | Ht 60.0 in | Wt 163.8 lb

## 2011-01-21 DIAGNOSIS — L723 Sebaceous cyst: Secondary | ICD-10-CM

## 2011-01-21 NOTE — Patient Instructions (Signed)
May shower in 48 hours.    Follow up in 2-3 weeks.  Call if having increased pain, fevers/chills or drainage.  Expect small amount of bloody drainage or 24 hours or so.

## 2011-01-21 NOTE — Assessment & Plan Note (Signed)
Incised and removed.   Entire wall of sac removed.  Follow up in 2-3 weeks.

## 2011-01-21 NOTE — Progress Notes (Signed)
Chief Complaint  Patient presents with  . Abscess    buttock    HISTORY: Patient is a 66 year old female who presents with a left buttock mass for several days. It is tender and sore.  She had one before several years ago. This required I&D in the emergency department. She denies fevers and chills. She is a Administrator, arts at [employer]. She has not noticed any drainage in this area. She has been placing ointment on the area without improvement.  It feels hard, like a marble.    Past Medical History  Diagnosis Date  . Other and unspecified hyperlipidemia     [place] study LDL goal=<130; NMR LDL  goal = < 160, ideally < 130  . Nonspecific elevation of levels of transaminase or lactic acid dehydrogenase (LDH)   . Polydipsia   . Other and unspecified ovarian cyst     PMHx  . Transient arterial occlusion of retina     x 3 ; last 2001  . Irritable bowel syndrome   . Rosacea   . Unspecified essential hypertension   . Colitis, ischemic 2010    Dr. Kinnie Scales  . Wheat intolerance     Past Surgical History  Procedure Date  . Cesarean section     x 2; G2 P2  . Tonsillectomy and adenoidectomy   . Peri ovarian cyst right resected 1980  . Appendectomy 1980  . Colonoscopy 2003, 2008    due 2018  . Upper gastrointestinal endoscopy 1986    Current Outpatient Prescriptions  Medication Sig Dispense Refill  . aspirin 325 MG tablet Take 325 mg by mouth daily. Taking a [facility] equivalent       . Aspirin-Acetaminophen-Caffeine (EXCEDRIN PO) Take by mouth. Taking a [facility] brand equivalent         . DiphenhydrAMINE HCl (BENADRYL ALLERGY PO) Take by mouth as needed.       Marland Kitchen levocetirizine (XYZAL) 5 MG tablet Take 1 tablet (5 mg total) by mouth every evening.  30 tablet  11  . metoprolol (TOPROL-XL) 50 MG 24 hr tablet Take 1 tablet (50 mg total) by mouth daily.  90 tablet  3  . Multiple Vitamins-Calcium (ONE-A-DAY WOMENS PO) Take 1 each by mouth daily.        . ramipril  (ALTACE) 5 MG capsule take 1 capsule by mouth twice a day  180 capsule  2     Allergies  Allergen Reactions  . Cephalosporins     urticaria  . Sulfonamide Derivatives     rash  . RUE:AVWUJWJXBJY+NWGNFAOZH+YQMVHQIONG Acid+Aspartame     diarrhea  . Simvastatin     ?  . Ezetimibe     Fatigue & joint pain     Family History  Problem Relation Age of Onset  . Diabetes Sister   . Diabetes Father   . Diabetes Maternal Grandfather   . Coronary artery disease Maternal Aunt   . Lung cancer Maternal Aunt   . Stroke Paternal Grandfather   . Dementia Paternal Grandfather   . Heart attack Maternal Aunt   . Heart failure Maternal Aunt   . Dementia Father   . Basal cell carcinoma Mother   . Hypertension Mother 8    Angioplasty  . Lung cancer Mother     Small cell  . Obesity Sister     x 2  . Thyroid nodules Sister   . Prostate cancer Sister     nephew  . Other  PTH nodule, nephew  . Diabetes Paternal Grandmother      History   Social History  . Marital Status: Married    Spouse Name: N/A    Number of Children: N/A  . Years of Education: N/A   Occupational History  . Teacher    Social History Main Topics  . Smoking status: Former Smoker    Quit date: 01/04/1969  . Smokeless tobacco: None  . Alcohol Use: Yes     Rarely  . Drug Use: No  . Sexually Active: None   Other Topics Concern  . None   Social History Narrative   Modified atkinsTeacherWalks 30 minutes daily     REVIEW OF SYSTEMS - PERTINENT POSITIVES ONLY: 12 point review of systems negative other than HPI and PMH except for headaches and nasal congestion.  EXAM: Filed Vitals:   01/21/11 1550  BP: 120/64  Pulse: 70  Temp: 97.4 F (36.3 C)  Resp: 14    Gen:  No acute distress.  Well nourished and well groomed.   Neurological: Alert and oriented to person, place, and time. Coordination normal.  Head: Normocephalic and atraumatic.  Eyes: Conjunctivae are normal. Pupils are equal, round,  and reactive to light. No scleral icterus.  Cardiovascular: Normal rate, regular rhythm.  Respiratory: Effort normal.  No respiratory distress.  Musculoskeletal: Normal range of motion. Extremities are nontender.  Buttock:  Firm tender mass 1 cm just to left of gluteal crease in prior site of I&D.  Cleaned with CHG, anesthetized, and incised and removed.   Skin: Skin is warm and dry. No rash noted. No diaphoresis. No erythema. No pallor. No clubbing, cyanosis, or edema.   Psychiatric: Normal mood and affect. Behavior is normal. Judgment and thought content normal.    ASSESSMENT AND PLAN: Sebaceous cyst of left buttock. Incised and removed.   Entire wall of sac removed.  Follow up in 2-3 weeks.    Maudry Diego MD Surgical Oncology, General and Endocrine Surgery [facility]      Visit Diagnoses: 1. Sebaceous cyst of left buttock.     Primary Care Physician: Marga Melnick, MD, MD

## 2011-01-27 ENCOUNTER — Telehealth (INDEPENDENT_AMBULATORY_CARE_PROVIDER_SITE_OTHER): Payer: Self-pay

## 2011-01-27 NOTE — Telephone Encounter (Signed)
Left pt msg that her post op appt is 02/15/11 at 2:15 p.m.

## 2011-01-28 ENCOUNTER — Telehealth (INDEPENDENT_AMBULATORY_CARE_PROVIDER_SITE_OTHER): Payer: Self-pay | Admitting: General Surgery

## 2011-01-28 NOTE — Telephone Encounter (Signed)
[facility] pt's appt moved to 02/22/11 at 4:30.

## 2011-02-15 ENCOUNTER — Encounter (INDEPENDENT_AMBULATORY_CARE_PROVIDER_SITE_OTHER): Payer: BC Managed Care – PPO | Admitting: General Surgery

## 2011-02-22 ENCOUNTER — Ambulatory Visit (INDEPENDENT_AMBULATORY_CARE_PROVIDER_SITE_OTHER): Payer: BC Managed Care – PPO | Admitting: General Surgery

## 2011-02-22 ENCOUNTER — Encounter (INDEPENDENT_AMBULATORY_CARE_PROVIDER_SITE_OTHER): Payer: Self-pay | Admitting: General Surgery

## 2011-02-22 VITALS — BP 126/84 | HR 68 | Temp 97.8°F | Resp 18 | Ht 60.0 in | Wt 166.0 lb

## 2011-02-22 DIAGNOSIS — L723 Sebaceous cyst: Secondary | ICD-10-CM

## 2011-02-22 NOTE — Progress Notes (Signed)
Subjective:     Patient ID: Kimberly Stephenson, female   DOB: [birth year], 66 y.o.   MRN: [id number]  HPI Pt doing well after I&D/excision of left buttock sebaceous cyst.  She denies pain.  She is not having drainage.  She denies fevers/ chills.    Review of Systems Otherwise negative.      Objective:   Physical Exam Incision just left of gluteal cleft.  Sutures removed.  Some scar tissue is present, but overall, area is soft.      Assessment/Plan:     Sebaceous cyst of left buttock. Follow up as needed. Sutures removed.  Advised pt to call if recurrent pain, swelling, or drainage.

## 2011-02-22 NOTE — Patient Instructions (Signed)
Call for recurrent buttock pain or swelling.  Follow up as needed.

## 2011-02-22 NOTE — Assessment & Plan Note (Signed)
Follow up as needed. Sutures removed.  Advised pt to call if recurrent pain, swelling, or drainage.

## 2011-03-24 ENCOUNTER — Ambulatory Visit (INDEPENDENT_AMBULATORY_CARE_PROVIDER_SITE_OTHER): Payer: BC Managed Care – PPO | Admitting: Internal Medicine

## 2011-03-24 ENCOUNTER — Encounter: Payer: Self-pay | Admitting: Internal Medicine

## 2011-03-24 VITALS — BP 124/72 | HR 91 | Temp 98.1°F | Wt 168.2 lb

## 2011-03-24 DIAGNOSIS — J029 Acute pharyngitis, unspecified: Secondary | ICD-10-CM

## 2011-03-24 LAB — POCT RAPID STREP A (OFFICE): Rapid Strep A Screen: NEGATIVE

## 2011-03-24 MED ORDER — FLUTICASONE PROPIONATE 50 MCG/ACT NA SUSP: 1.0000 | Freq: Two times a day (BID) | NASAL | Status: DC | PRN

## 2011-03-24 MED ORDER — CLARITHROMYCIN ER 500 MG PO TB24: ORAL_TABLET | ORAL | Status: DC

## 2011-03-24 NOTE — Progress Notes (Signed)
  Subjective:    Patient ID: Kimberly Stephenson, female    DOB: 07/01/1945, 66 y.o.   MRN: [id number]  HPI  Respiratory tract infection Onset/symptoms:03/19/11 as ST Exposures (illness/environmental/extrinsic):sick students Progression of symptoms:to major sinus congestion Treatments/response:saline sniffs, Robitussin DM, Xyzal with some benefit Present symptoms: Fever/chills/sweats:low grade fever Frontal headache:no Facial pain:L maxilla Nasal purulence:yellowwith some blood Sore throat:yes Dental pain:L upper jaw Lymphadenopathy:no Wheezing/shortness of breath:no Cough/sputum/hemoptysis:yellow; > from head Associated extrinsic/allergic symptoms:itchy eyes/ sneezing:no Past medical history: Seasonal allergies: yes/asthma:intermittent post cold air or bus exhaust Smoking history:quit 1971           Review of Systems     Objective:   Physical Exam General appearance:good health ;well nourished; no acute distress or increased work of breathing is present.  No  lymphadenopathy about the head, neck, or axilla noted.   Eyes: No conjunctival inflammation or lid edema is present.  Ears:  External ear exam shows no significant lesions or deformities.  Otoscopic examination reveals clear canals, tympanic membranes are intact bilaterally without bulging, retraction, inflammation or discharge.  Nose:  External nasal examination shows no deformity or inflammation. Nasal mucosa are dry without lesions or exudates. No septal dislocation or deviation.No obstruction to airflow.   Oral exam: Dental hygiene is good; lips and gums are healthy appearing.There is no oropharyngeal erythema or exudate noted.      Heart:  Normal rate and regular rhythm. S1 and S2 normal without gallop, murmur, click, rub . S 4 Lungs:Chest clear to auscultation; no wheezes, rhonchi,rales ,or rubs present.No increased work of breathing.    Extremities:  No cyanosis, edema, or clubbing  noted    Skin: Warm & dry             Assessment & Plan:  #1 rhinosinusitis with bronchitis; no bronchospasm Plan: Nasal hygiene interventions discussed. See prescription medications

## 2011-03-24 NOTE — Patient Instructions (Signed)
Nasal cleansing in the shower as discussed. Make sure that all residual soap is removed to prevent irritation. Fluticasone  1 spray in each nostril twice a day as needed. Use the "crossover" technique as discussed

## 2011-07-01 ENCOUNTER — Other Ambulatory Visit: Payer: Self-pay | Admitting: Internal Medicine

## 2011-07-01 ENCOUNTER — Other Ambulatory Visit: Payer: Self-pay | Admitting: Obstetrics and Gynecology

## 2011-07-01 DIAGNOSIS — Z1231 Encounter for screening mammogram for malignant neoplasm of breast: Secondary | ICD-10-CM

## 2011-07-14 ENCOUNTER — Ambulatory Visit
Admission: RE | Admit: 2011-07-14 | Discharge: 2011-07-14 | Disposition: A | Discharge status: Home / Self Care | Payer: BC Managed Care – PPO | Source: Ambulatory Visit | Attending: Internal Medicine | Admitting: Internal Medicine

## 2011-07-14 DIAGNOSIS — Z1231 Encounter for screening mammogram for malignant neoplasm of breast: Secondary | ICD-10-CM

## 2011-08-16 ENCOUNTER — Other Ambulatory Visit: Payer: Self-pay | Admitting: Internal Medicine

## 2011-08-24 ENCOUNTER — Encounter: Payer: Self-pay | Admitting: Internal Medicine

## 2011-08-24 ENCOUNTER — Ambulatory Visit (INDEPENDENT_AMBULATORY_CARE_PROVIDER_SITE_OTHER): Payer: BC Managed Care – PPO | Admitting: Internal Medicine

## 2011-08-24 VITALS — BP 128/76 | HR 62 | Temp 98.5°F | Resp 12 | Ht 60.0 in | Wt 168.2 lb

## 2011-08-24 DIAGNOSIS — Z0000 Encounter for general adult medical examination without abnormal findings: Secondary | ICD-10-CM

## 2011-08-24 DIAGNOSIS — K589 Irritable bowel syndrome without diarrhea: Secondary | ICD-10-CM

## 2011-08-24 DIAGNOSIS — Z23 Encounter for immunization: Secondary | ICD-10-CM

## 2011-08-24 DIAGNOSIS — R05 Cough: Secondary | ICD-10-CM

## 2011-08-24 DIAGNOSIS — B49 Unspecified mycosis: Secondary | ICD-10-CM

## 2011-08-24 LAB — BASIC METABOLIC PANEL
BUN: 10 mg/dL (ref 6–23)
CO2: 28 mEq/L (ref 19–32)
Chloride: 107 mEq/L (ref 96–112)
Glucose, Bld: 85 mg/dL (ref 70–99)
Potassium: 4.3 mEq/L (ref 3.5–5.1)

## 2011-08-24 LAB — CBC WITH DIFFERENTIAL/PLATELET
Eosinophils Relative: 2.9 % (ref 0.0–5.0)
HCT: 40.1 % (ref 36.0–46.0)
Lymphs Abs: 1.6 10*3/uL (ref 0.7–4.0)
MCHC: 32.9 g/dL (ref 30.0–36.0)
MCV: 92.1 fl (ref 78.0–100.0)
Monocytes Absolute: 0.5 10*3/uL (ref 0.1–1.0)
Platelets: 211 10*3/uL (ref 150.0–400.0)
RDW: 13.2 % (ref 11.5–14.6)
WBC: 5.2 10*3/uL (ref 4.5–10.5)

## 2011-08-24 LAB — LIPID PANEL
Cholesterol: 207 mg/dL — ABNORMAL HIGH (ref 0–200)
Total CHOL/HDL Ratio: 3
Triglycerides: 131 mg/dL (ref 0.0–149.0)

## 2011-08-24 LAB — LDL CHOLESTEROL, DIRECT: Direct LDL: 118.4 mg/dL

## 2011-08-24 LAB — HEPATIC FUNCTION PANEL
ALT: 22 U/L (ref 0–35)
Total Bilirubin: 0.7 mg/dL (ref 0.3–1.2)
Total Protein: 7.1 g/dL (ref 6.0–8.3)

## 2011-08-24 LAB — TSH: TSH: 2.68 u[IU]/mL (ref 0.35–5.50)

## 2011-08-24 NOTE — Progress Notes (Signed)
Subjective:    Patient ID: Kimberly Stephenson, female    DOB: [birth year], 66 y.o.   MRN: [id number]  HPI  Kimberly Stephenson  is here for a physical;acute issues include constant clearing of the throat & dry cough. Her nasal spray helps this & also  PNDrainage. She is on ramipril.      Review of Systems  HYPERTENSION: Disease Monitoring  Blood pressure range: not monitored  Chest pain: no   Dyspnea: no   Claudication:no Medication compliance: yes Medication Side Effects( see above)  Lightheadedness:no   Urinary frequency: in am , ? From caffeine  Edema:no   Preventitive Healthcare:  Exercise: 30 min as walking   Diet Pattern: modified Atkins'  Salt Restriction: salt "to taste"       Objective:   Physical Exam Gen.:  well-nourished in appearance. Alert, appropriate and cooperative throughout exam. Head: Normocephalic without obvious abnormalities  Eyes: No corneal or conjunctival inflammation noted. Pupils equal round reactive to light and accommodation. Fundal exam is benign without hemorrhages, exudate, papilledema. Extraocular motion intact. Vision grossly normal with lenses. Ears: External  ear exam reveals no significant lesions or deformities. Canals clear .TMs normal. Hearing is grossly normal bilaterally. Nose: External nasal exam reveals no deformity or inflammation. Nasal mucosa are pink and moist. No lesions or exudates noted.   Mouth: Oral mucosa and oropharynx reveal no lesions or exudates. Teeth in good repair. Neck: No deformities, masses, or tenderness noted. Range of motion & Thyroid normal. Lungs: Normal respiratory effort; chest expands symmetrically. Lungs are clear to auscultation without rales, wheezes, or increased work of breathing. Heart: Normal rate and rhythm. Normal S1 and S2. No gallop, click, or rub. S4 w/o murmur. Abdomen: Bowel sounds normal; abdomen soft and nontender. No masses, organomegaly or hernias noted. Genitalia: Dr Maxwell Marion                                                                             Musculoskeletal/extremities: No deformity or scoliosis noted of  the thoracic or lumbar spine. No clubbing, cyanosis, edema, or deformity noted. Range of motion  normal .Tone & strength  normal.Joints normal. Nail health  good. Vascular: Carotid, radial artery, dorsalis pedis and  posterior tibial pulses are full and equal. No bruits present. Neurologic: Alert and oriented x3. Deep tendon reflexes symmetrical and normal.         Skin: Intact without suspicious lesions or rashes. Lymph: No cervical, axillary lymphadenopathy present. Psych: Mood and affect are normal. Normally interactive                                                                                        Assessment & Plan:  #1 comprehensive physical exam; no acute findings #2 see Problem List with Assessments & Recommendations  #3 cough, probably ACE-induced. She's reluctant to change this medication. She's requesting spirometric assessment  Plan: see Orders

## 2011-08-24 NOTE — Patient Instructions (Addendum)
Preventive Health Care: Exercise  30-45  minutes a day, 3-4 days a week. Walking is especially valuable in preventing Osteoporosis. Eat a low-fat diet with lots of fruits and vegetables, up to 7-9 servings per day.  Consume less than 30 grams of sugar per day from foods & drinks with High Fructose Corn Syrup as #1,2,3 or #4 on label. Health Care Power of Attorney & Living Will place you in charge of your health care  decisions. Verify these are  in place. Blood Pressure Goal  Ideally is an AVERAGE < 135/85. This AVERAGE should be calculated from @ least 5-7 BP readings taken @ different times of day on different days of week. You should not respond to isolated BP readings , but rather the AVERAGE for that week.  If you activate My Chart; the results can be released to you as soon as they populate from the lab. If you choose not to use this program; the labs have to be reviewed, copied & mailed   causing a delay in getting the results to you.  

## 2011-08-25 ENCOUNTER — Telehealth: Payer: Self-pay | Admitting: Internal Medicine

## 2011-08-25 NOTE — Telephone Encounter (Signed)
Patient has asked me to inform Dr. Alwyn Ren, that she does not want to do the PFT at this time.  One reason because she would have to have later than 4pm, and 4pm is the latest they pulmonary can schedule this test.  Also, she didn't want the test that last almost 1 hour, she just wanted "the little blowy thing".  Lastly, she doesn't know how much this costs, if her insurance will pay, and whether or not she has met her deductible.  I have cancelled her PFT.

## 2011-08-27 ENCOUNTER — Ambulatory Visit: Payer: BC Managed Care – PPO

## 2011-11-17 ENCOUNTER — Other Ambulatory Visit: Payer: Self-pay | Admitting: Internal Medicine

## 2011-11-18 ENCOUNTER — Other Ambulatory Visit: Payer: Self-pay

## 2011-11-18 NOTE — Telephone Encounter (Signed)
Rx sent.    MW 

## 2011-11-18 NOTE — Telephone Encounter (Signed)
Pt called [facility] triage line concerning 2Rx metoprolol and ramipril. Pt stated need refill and out of meds. I looked an both Rx had been filled already today. I called to advise pt I sent them this AM. Pt stated understanding.    MW

## 2012-02-12 ENCOUNTER — Other Ambulatory Visit: Payer: Self-pay | Admitting: Internal Medicine

## 2012-08-21 ENCOUNTER — Ambulatory Visit (INDEPENDENT_AMBULATORY_CARE_PROVIDER_SITE_OTHER): Payer: BC Managed Care – PPO | Admitting: Internal Medicine

## 2012-08-21 ENCOUNTER — Encounter: Payer: Self-pay | Admitting: Internal Medicine

## 2012-08-21 VITALS — BP 120/80 | HR 78 | Temp 98.2°F | Ht 60.25 in | Wt 167.2 lb

## 2012-08-21 DIAGNOSIS — E785 Hyperlipidemia, unspecified: Secondary | ICD-10-CM

## 2012-08-21 DIAGNOSIS — Z1331 Encounter for screening for depression: Secondary | ICD-10-CM

## 2012-08-21 DIAGNOSIS — Z0000 Encounter for general adult medical examination without abnormal findings: Secondary | ICD-10-CM

## 2012-08-21 DIAGNOSIS — I10 Essential (primary) hypertension: Secondary | ICD-10-CM

## 2012-08-21 LAB — HEPATIC FUNCTION PANEL
AST: 25 U/L (ref 0–37)
Alkaline Phosphatase: 61 U/L (ref 39–117)
Total Bilirubin: 0.4 mg/dL (ref 0.3–1.2)

## 2012-08-21 LAB — LIPID PANEL
Cholesterol: 220 mg/dL — ABNORMAL HIGH (ref 0–200)
Total CHOL/HDL Ratio: 4
VLDL: 28.8 mg/dL (ref 0.0–40.0)

## 2012-08-21 LAB — CBC WITH DIFFERENTIAL/PLATELET
Basophils Absolute: 0.2 10*3/uL — ABNORMAL HIGH (ref 0.0–0.1)
Lymphocytes Relative: 29.8 % (ref 12.0–46.0)
Lymphs Abs: 1.7 10*3/uL (ref 0.7–4.0)
Monocytes Relative: 8.2 % (ref 3.0–12.0)
Platelets: 237 10*3/uL (ref 150.0–400.0)
RDW: 13.2 % (ref 11.5–14.6)

## 2012-08-21 LAB — BASIC METABOLIC PANEL
BUN: 11 mg/dL (ref 6–23)
Calcium: 9 mg/dL (ref 8.4–10.5)
Creatinine, Ser: 0.8 mg/dL (ref 0.4–1.2)
GFR: 75.47 mL/min (ref >60.00)
Glucose, Bld: 83 mg/dL (ref 70–99)

## 2012-08-21 MED ORDER — LOSARTAN POTASSIUM 50 MG PO TABS: ORAL_TABLET | ORAL | Status: DC

## 2012-08-21 NOTE — Progress Notes (Signed)
  Subjective:    Patient ID: Kimberly Stephenson, female    DOB: Oct 17, 1945, 67 y.o.   MRN: [id number]  HPI  She is here for a physical;acute issues include recurrent throat clearing which she relates to her ACE inhibitor.      Review of Systems  She is on a heart healthy diet; she is not  exercising . She denies chest pain, palpitations, dyspnea, or claudication. Family history is positive for premature coronary disease in 2 maternal aunts & her  mother. Advanced cholesterol testing reveals her LDL goal is less than 160; ideally < 130.      Objective:   Physical Exam Gen.:  well-nourished in appearance. Alert, appropriate and cooperative throughout exam.Appears younger than stated age  Head: Normocephalic without obvious abnormalities;  Hair fine  Eyes: No corneal or conjunctival inflammation noted. Extraocular motion intact. Vision grossly normal with lenses Ears: External  ear exam reveals no significant lesions or deformities. Canals clear .TMs normal. Hearing is grossly normal bilaterally. Nose: External nasal exam reveals no deformity or inflammation. Nasal mucosa are pink and moist. No lesions or exudates noted.   Mouth: Oral mucosa and oropharynx reveal no lesions or exudates. Teeth in good repair. Neck: No deformities, masses, or tenderness noted. Range of motion decreased. Thyroid normal. Lungs: Normal respiratory effort; chest expands symmetrically. Lungs are clear to auscultation without rales, wheezes, or increased work of breathing. Heart: Normal rate and rhythm. Normal S1 and S2. No gallop, click, or rub. S4 w/o  murmur. Abdomen: Bowel sounds normal; abdomen soft and nontender. No masses, organomegaly or hernias noted. Genitalia: As per Gyn                                  Musculoskeletal/extremities: No deformity or scoliosis noted of  the thoracic or lumbar spine.  No clubbing, cyanosis, edema, or significant extremity  deformity noted. Range of motion normal .Tone & strength   Normal. Joints normal ; minor crepitus knees. Nail health good. Able to lie down & sit up w/o help. Negative SLR bilaterally Vascular: Carotid, radial artery, dorsalis pedis and  posterior tibial pulses are full and equal. No bruits present. Neurologic: Alert and oriented x3. Deep tendon reflexes symmetrical and normal.  Gait normal  including heel & toe walking .        Skin: Intact without suspicious lesions or rashes. Lymph: No cervical, axillary lymphadenopathy present. Psych: Mood and affect are normal. Normally interactive                                                                                        Assessment & Plan:  #1 comprehensive physical exam; no acute findings  Plan: see Orders  & Recommendations

## 2012-08-21 NOTE — Patient Instructions (Addendum)
Minimal Blood Pressure Goal= AVERAGE < 140/90;  Ideal is an AVERAGE < 135/85. This AVERAGE should be calculated from @ least 5-7 BP readings taken @ different times of day on different days of week. You should not respond to isolated BP readings , but rather the AVERAGE for that week .Please bring your  blood pressure cuff to office visits to verify that it is reliable.It  can also be checked against the blood pressure device at the pharmacy. Finger or wrist cuffs are not dependable; an arm cuff is.Fill the  prescription for Losartan if BP not @ goal off Ramipril.  If you activate the  My Chart system; lab & Xray results will be released directly  to you as soon as I review & address these through the computer. If you choose not to sign up for My Chart within 36 hours of labs being drawn; results will be reviewed & interpretation added before being copied & mailed, causing a delay in getting the results to you.If you do not receive that report within 7-10 days ,please call. Additionally you can use this system to gain direct  access to your records  if  out of town or @ an office of a  physician who is not in  the My Chart network.  This improves continuity of care & places you in control of your medical record.

## 2012-09-21 ENCOUNTER — Encounter: Payer: Self-pay | Admitting: Internal Medicine

## 2012-09-21 ENCOUNTER — Ambulatory Visit (INDEPENDENT_AMBULATORY_CARE_PROVIDER_SITE_OTHER): Payer: BC Managed Care – PPO | Admitting: Internal Medicine

## 2012-09-21 VITALS — BP 120/80 | HR 115 | Temp 99.3°F | Wt 168.4 lb

## 2012-09-21 DIAGNOSIS — J209 Acute bronchitis, unspecified: Secondary | ICD-10-CM

## 2012-09-21 DIAGNOSIS — J069 Acute upper respiratory infection, unspecified: Secondary | ICD-10-CM

## 2012-09-21 MED ORDER — CLARITHROMYCIN ER 500 MG PO TB24: 1000.0000 mg | ORAL_TABLET | Freq: Every day | ORAL | Status: DC

## 2012-09-21 MED ORDER — HYDROCODONE-HOMATROPINE 5-1.5 MG/5ML PO SYRP: 5.0000 mL | ORAL_SOLUTION | Freq: Four times a day (QID) | ORAL | Status: DC | PRN

## 2012-09-21 NOTE — Patient Instructions (Addendum)

## 2012-09-21 NOTE — Progress Notes (Signed)
  Subjective:    Patient ID: Kimberly Stephenson, female    DOB: Jul 27, 1945, 67 y.o.   MRN: [id number]  HPI   Symptoms began 09/19/12 is severe sore throat associated with pain in the frontal and maxillary sinuses with associated yellow nasal discharge. She felt hot but did not take her temperature.  She has not produced sputum; but she does swallow the sputum. She's had associated shortness of breath.  She smoked probably 2 years but up to 2 packs a day. She's had reactive airways symptoms on 2 occasions. One followed cold air exposure and the other  in the context of Mr. tract infection   Review of Systems  She had no associated extrinsic symptoms of itchy, watery eyes, sneezing.  She denied chills or sweats  She also denied any pain, otic discharge, or dental pain. She is not wheezing despite having some shortness of breath.    Objective:   Physical Exam  General appearance:good health ;well nourished; no acute distress or increased work of breathing is present.  No  lymphadenopathy about the head, neck, or axilla noted.   Eyes: No conjunctival inflammation or lid edema is present.   Ears:  External ear exam shows no significant lesions or deformities.  Otoscopic examination reveals clear canals, tympanic membranes are intact bilaterally without bulging, retraction, inflammation or discharge.  Nose:  External nasal examination shows no deformity or inflammation. Nasal mucosa are pink and moist without lesions or exudates. No septal dislocation or deviation.No obstruction to airflow.   Oral exam: Dental hygiene is good; lips and gums are healthy appearing.There is no oropharyngeal erythema or exudate noted.   Neck:  No deformities, thyromegaly, masses, or tenderness noted.   Supple with full range of motion without pain.   Heart:  Normal rate and regular rhythm. S1 and S2 normal without gallop, murmur, click, rub or other extra sounds.   Lungs:No wheezes; mild rhonchi & rales   present.No increased work of breathing.    Extremities:  No cyanosis, edema, or clubbing  noted    Skin: Warm & dry          Assessment & Plan:  #1 acute bronchitis w/o bronchospasm #2 URI, acute Plan: See orders and recommendations

## 2012-09-28 ENCOUNTER — Telehealth: Payer: Self-pay | Admitting: *Deleted

## 2012-09-28 NOTE — Telephone Encounter (Signed)
Spoke with the pt and informed her of Dr. Frederik Pear recommendation below.  Pt understood and agreed.//AB/CMA

## 2012-09-28 NOTE — Telephone Encounter (Signed)
Patient called and said that she was seen 09/21/12 for bronchitis and was placed on robaxin. She states she has 3 days left and she is not feeling better. She is coughing up yellow tinged sputum and says her cough is prominent. She would like to know if she should finish the course of Robaxin and see how she sees then or if something else needs to be done. She is concerned that she may have pneumonia. Please advise.

## 2012-09-28 NOTE — Telephone Encounter (Signed)
She has a history of allergies to penicillin and sulfa which eliminates most antibiotics. She should complete the entire course of Biaxin and then let us know if symptoms persist.

## 2012-10-04 ENCOUNTER — Telehealth (INDEPENDENT_AMBULATORY_CARE_PROVIDER_SITE_OTHER): Payer: Self-pay

## 2012-10-04 NOTE — Telephone Encounter (Signed)
Patient states she had had another cyst to come up on her left buttock,just above the last one. She is hot soaks at this time. Appointment scheduled with DR. Byerly  On 10/11/12@ 3p advised patient to continue with soaks and it got worse to call

## 2012-10-06 ENCOUNTER — Encounter (INDEPENDENT_AMBULATORY_CARE_PROVIDER_SITE_OTHER): Payer: Self-pay | Admitting: General Surgery

## 2012-10-06 ENCOUNTER — Ambulatory Visit (INDEPENDENT_AMBULATORY_CARE_PROVIDER_SITE_OTHER): Payer: BC Managed Care – PPO | Admitting: General Surgery

## 2012-10-06 VITALS — BP 128/78 | HR 88 | Temp 98.3°F | Resp 14 | Ht 60.0 in | Wt 168.2 lb

## 2012-10-06 DIAGNOSIS — L729 Follicular cyst of the skin and subcutaneous tissue, unspecified: Secondary | ICD-10-CM | POA: Insufficient documentation

## 2012-10-06 DIAGNOSIS — L723 Sebaceous cyst: Secondary | ICD-10-CM

## 2012-10-06 NOTE — Patient Instructions (Signed)
You have  developed another abscess on your left gluteal region, and since it has spontaneously drained, and there is almost no mass, no excision is required. It does not appear to communicate with your rectum.  You will be given a prescription for doxycycline to take for 12 days  Take a warm tub bath twice a day to help with drainage  keep your appointment with Dr. Almond Lint in 2 weeks.

## 2012-10-06 NOTE — Progress Notes (Signed)
Patient ID: Kimberly Stephenson, female   DOB: 1945/10/22, 67 y.o.   MRN: [id number] History: This patient presents with a draining abscess of her left gluteal region. In 2011 she underwent incision and drainage of an abscess of her left gluteal region by the emergency department. In 2013 she saw Dr. Almond Lint who excised infected sebaceous cyst of the left buttock. The patient states that the cyst was posterior to the rectum than this area. She now presents with a 3-5 day history of progressive swelling and pain in the left gluteal area pieces of this enlarged from a pea-sized walnut size but it started draining last minute she feels better now.  ROS: 10 system review of systems is negative except as described above  Exam:   patient is alert, intelligent, good insight. No obvious physical distress Rectal: Gluteal and perianal areas are examined. There is a small draining area on the left gluteal area about 6 cm to the left and posterior to the anal verge. There is no more than 1 cm mass in this area but seems to be resolving induration. No significant cellulitis. No sebaceous material. No midline infection to suggest pilonidal abscess. Rectal probe did not go anywhere despite multiple attempts, talk could not demonstrate a fistula in ano.  Assessment:  Left gluteal abscess. This is recurrent although in a different location than previous.  Appears  to be a sebaceous cyst or some other perianal or pilonidal process. No indication for surgical intervention today  Plan: Doxycycline 100 mg by mouth twice a day x12 days 1-2 or 3 times a day She will keep her appointment with Dr. Donell Beers in 2 weeks.    Kimberly Stephenson. Derrell Lolling, M.D., [facility] General and Minimally invasive Surgery Breast and Colorectal Surgery Office:   [phone] Pager:   [phone]

## 2012-10-11 ENCOUNTER — Encounter (INDEPENDENT_AMBULATORY_CARE_PROVIDER_SITE_OTHER): Payer: Self-pay | Admitting: General Surgery

## 2012-10-18 ENCOUNTER — Encounter (INDEPENDENT_AMBULATORY_CARE_PROVIDER_SITE_OTHER): Payer: Self-pay | Admitting: General Surgery

## 2012-10-20 ENCOUNTER — Telehealth (INDEPENDENT_AMBULATORY_CARE_PROVIDER_SITE_OTHER): Payer: Self-pay | Admitting: General Surgery

## 2012-10-20 ENCOUNTER — Ambulatory Visit (INDEPENDENT_AMBULATORY_CARE_PROVIDER_SITE_OTHER): Payer: BC Managed Care – PPO | Admitting: General Surgery

## 2012-10-20 ENCOUNTER — Encounter (INDEPENDENT_AMBULATORY_CARE_PROVIDER_SITE_OTHER): Payer: Self-pay | Admitting: General Surgery

## 2012-10-20 VITALS — BP 126/80 | HR 72 | Temp 98.7°F | Resp 15 | Ht 60.0 in | Wt 170.2 lb

## 2012-10-20 DIAGNOSIS — L729 Follicular cyst of the skin and subcutaneous tissue, unspecified: Secondary | ICD-10-CM

## 2012-10-20 DIAGNOSIS — L723 Sebaceous cyst: Secondary | ICD-10-CM

## 2012-10-20 NOTE — Telephone Encounter (Signed)
Advised pt I would be a bit late today to clinic.  She cannot come later in the day, so we will get her back to school by 10:15.

## 2012-10-20 NOTE — Patient Instructions (Signed)
Neosporin and warm water baths.  Office surgery November 25.

## 2012-10-20 NOTE — Assessment & Plan Note (Signed)
We will plan excision of the sebaceous cyst in the office.   We reviewed risks of infection, bleeding, and wound issues, recurrence.  We have found a mutually good time to do the office procedure.  I will see her back at that time.  In the meantime, she will place Neosporin on the area and do warm water baths.

## 2012-10-20 NOTE — Progress Notes (Signed)
HISTORY: Patient is a 67 year old female who has a recurrent sebaceous cyst on her left buttock. I did this out of the office several years ago. She came back around 2 weeks ago with a recurrent cyst that is approximately the size of a walnut. This ended up draining spontaneously. She saw Dr. Derrell Lolling at that time. He recommended that she keep her appointment with me.  She denies fevers or chills. She states that the lesion there now is around the size of a pea.   PERTINENT REVIEW OF SYSTEMS: Otherwise negative x 11.    Filed Vitals:   10/20/12 0910  BP: 126/80  Pulse: 72  Temp: 98.7 F (37.1 C)  Resp: 15     EXAM: Head: Normocephalic and atraumatic.  Eyes:  Conjunctivae are normal. Pupils are equal, round, and reactive to light. No scleral icterus.  Neck:  Normal range of motion.  Resp: No respiratory distress, normal effort. Neurological: Alert and oriented to person, place, and time. Coordination normal.  Skin: Skin is warm and dry. No rash noted. No diaphoretic. No erythema. No pallor. The cyst on the buttock is around the size of a pea.  It is at the lower border of the prior incision.   Psychiatric: Normal mood and affect. Normal behavior. Judgment and thought content normal.      ASSESSMENT AND PLAN:   Cyst of buttocks We will plan excision of the sebaceous cyst in the office.   We reviewed risks of infection, bleeding, and wound issues, recurrence.  We have found a mutually good time to do the office procedure.  I will see her back at that time.  In the meantime, she will place Neosporin on the area and do warm water baths.      Maudry Diego, MD Surgical Oncology, General & Endocrine Surgery [facility]  Marga Melnick, MD Pecola Lawless, MD

## 2012-11-09 ENCOUNTER — Other Ambulatory Visit: Payer: Self-pay | Admitting: Internal Medicine

## 2012-11-10 NOTE — Telephone Encounter (Signed)
Metoprolol refill sent to pharmcy

## 2012-11-11 ENCOUNTER — Other Ambulatory Visit: Payer: Self-pay | Admitting: Internal Medicine

## 2012-11-13 NOTE — Telephone Encounter (Signed)
Levocetirizine refill sent to pharmacy 

## 2012-11-20 ENCOUNTER — Other Ambulatory Visit: Payer: Self-pay | Admitting: *Deleted

## 2012-11-20 ENCOUNTER — Telehealth: Payer: Self-pay | Admitting: Internal Medicine

## 2012-11-20 MED ORDER — RAMIPRIL 5 MG PO CAPS: 5.0000 mg | ORAL_CAPSULE | Freq: Every day | ORAL | Status: DC

## 2012-11-20 NOTE — Telephone Encounter (Signed)
Ramipril refilled per patient request

## 2012-11-20 NOTE — Telephone Encounter (Signed)
Patient is calling to request that her Ramipril rx directions be changed to take twice a day and re-sent to her pharmacy. Patient states that this is how it was written previously. Please advise.

## 2012-11-21 ENCOUNTER — Other Ambulatory Visit: Payer: Self-pay | Admitting: *Deleted

## 2012-11-21 MED ORDER — RAMIPRIL 5 MG PO CAPS: 5.0000 mg | ORAL_CAPSULE | Freq: Two times a day (BID) | ORAL | Status: DC

## 2012-11-21 NOTE — Telephone Encounter (Signed)
Done

## 2012-11-21 NOTE — Telephone Encounter (Signed)
Ramipril refilled because patient takes medication BID.

## 2012-11-28 ENCOUNTER — Ambulatory Visit (INDEPENDENT_AMBULATORY_CARE_PROVIDER_SITE_OTHER): Payer: BC Managed Care – PPO | Admitting: General Surgery

## 2013-05-07 ENCOUNTER — Other Ambulatory Visit: Payer: Self-pay | Admitting: Internal Medicine

## 2013-05-19 ENCOUNTER — Other Ambulatory Visit: Payer: Self-pay | Admitting: Internal Medicine

## 2013-08-22 ENCOUNTER — Ambulatory Visit (INDEPENDENT_AMBULATORY_CARE_PROVIDER_SITE_OTHER): Payer: Medicare Other | Admitting: Internal Medicine

## 2013-08-22 ENCOUNTER — Other Ambulatory Visit (INDEPENDENT_AMBULATORY_CARE_PROVIDER_SITE_OTHER): Payer: Medicare Other

## 2013-08-22 ENCOUNTER — Encounter: Payer: Self-pay | Admitting: Internal Medicine

## 2013-08-22 ENCOUNTER — Ambulatory Visit (INDEPENDENT_AMBULATORY_CARE_PROVIDER_SITE_OTHER)
Admission: RE | Admit: 2013-08-22 | Discharge: 2013-08-22 | Disposition: A | Discharge status: Home / Self Care | Payer: Medicare Other | Source: Ambulatory Visit | Attending: Internal Medicine | Admitting: Internal Medicine

## 2013-08-22 VITALS — BP 118/86 | HR 73 | Temp 98.5°F | Wt 171.1 lb

## 2013-08-22 DIAGNOSIS — Z1159 Encounter for screening for other viral diseases: Secondary | ICD-10-CM

## 2013-08-22 DIAGNOSIS — K589 Irritable bowel syndrome without diarrhea: Secondary | ICD-10-CM

## 2013-08-22 DIAGNOSIS — R05 Cough: Secondary | ICD-10-CM

## 2013-08-22 DIAGNOSIS — R059 Cough, unspecified: Secondary | ICD-10-CM

## 2013-08-22 DIAGNOSIS — I10 Essential (primary) hypertension: Secondary | ICD-10-CM

## 2013-08-22 DIAGNOSIS — R5383 Other fatigue: Secondary | ICD-10-CM

## 2013-08-22 DIAGNOSIS — R5381 Other malaise: Secondary | ICD-10-CM

## 2013-08-22 DIAGNOSIS — E785 Hyperlipidemia, unspecified: Secondary | ICD-10-CM

## 2013-08-22 LAB — CBC WITH DIFFERENTIAL/PLATELET
BASOS PCT: 0.8 % (ref 0.0–3.0)
Basophils Absolute: 0 10*3/uL (ref 0.0–0.1)
EOS ABS: 0.2 10*3/uL (ref 0.0–0.7)
EOS PCT: 2.9 % (ref 0.0–5.0)
HCT: 40.6 % (ref 36.0–46.0)
HEMOGLOBIN: 14 g/dL (ref 12.0–15.0)
LYMPHS PCT: 28.4 % (ref 12.0–46.0)
Lymphs Abs: 1.6 10*3/uL (ref 0.7–4.0)
MCHC: 34.4 g/dL (ref 30.0–36.0)
MCV: 89.5 fl (ref 78.0–100.0)
MONOS PCT: 8.8 % (ref 3.0–12.0)
Monocytes Absolute: 0.5 10*3/uL (ref 0.1–1.0)
NEUTROS ABS: 3.4 10*3/uL (ref 1.4–7.7)
Neutrophils Relative %: 59.1 % (ref 43.0–77.0)
Platelets: 253 10*3/uL (ref 150.0–400.0)
RBC: 4.53 Mil/uL (ref 3.87–5.11)
RDW: 12.3 % (ref 11.5–15.5)
WBC: 5.8 10*3/uL (ref 4.0–10.5)

## 2013-08-22 LAB — BASIC METABOLIC PANEL
BUN: 13 mg/dL (ref 6–23)
CO2: 27 mEq/L (ref 19–32)
Calcium: 9.1 mg/dL (ref 8.4–10.5)
Chloride: 105 mEq/L (ref 96–112)
Creatinine, Ser: 0.8 mg/dL (ref 0.4–1.2)
GFR: 74.18 mL/min (ref >60.00)
Glucose, Bld: 96 mg/dL (ref 70–99)
Potassium: 4.3 mEq/L (ref 3.5–5.1)
SODIUM: 138 meq/L (ref 135–145)

## 2013-08-22 LAB — LIPID PANEL
CHOL/HDL RATIO: 4
Cholesterol: 234 mg/dL — ABNORMAL HIGH (ref 0–200)
HDL: 59.1 mg/dL (ref >39.00)
LDL Cholesterol: 150 mg/dL — ABNORMAL HIGH (ref 0–99)
NonHDL: 174.9
TRIGLYCERIDES: 125 mg/dL (ref 0.0–149.0)
VLDL: 25 mg/dL (ref 0.0–40.0)

## 2013-08-22 LAB — HEPATIC FUNCTION PANEL
ALK PHOS: 70 U/L (ref 39–117)
ALT: 21 U/L (ref 0–35)
AST: 21 U/L (ref 0–37)
Albumin: 4 g/dL (ref 3.5–5.2)
Bilirubin, Direct: 0.1 mg/dL (ref 0.0–0.3)
TOTAL PROTEIN: 7.3 g/dL (ref 6.0–8.3)
Total Bilirubin: 0.5 mg/dL (ref 0.2–1.2)

## 2013-08-22 LAB — TSH: TSH: 2.72 u[IU]/mL (ref 0.35–4.50)

## 2013-08-22 LAB — HEPATITIS C ANTIBODY: HCV AB: NEGATIVE

## 2013-08-22 NOTE — Assessment & Plan Note (Signed)
Lipids, LFTs, TSH  

## 2013-08-22 NOTE — Assessment & Plan Note (Signed)
CBC

## 2013-08-22 NOTE — Patient Instructions (Signed)
Minimal Blood Pressure Goal= AVERAGE < 140/90;  Ideal is an AVERAGE < 135/85. This AVERAGE should be calculated from @ least 5-7 BP readings taken @ different times of day on different days of week. You should not respond to isolated BP readings , but rather the AVERAGE for that week .Please bring your  blood pressure cuff to office visits to verify that it is reliable.It  can also be checked against the blood pressure device at the pharmacy. Finger or wrist cuffs are not dependable; an arm cuff is.  Your next office appointment will be determined based upon review of your pending labs &/ or x-rays. Those instructions will be transmitted to you through My Chart  OR  by mail;whichever process is your choice to receive results & recommendations .

## 2013-08-22 NOTE — Progress Notes (Signed)
Pre visit review using our clinic review tool, if applicable. No additional management support is needed unless otherwise documented below in the visit note. 

## 2013-08-22 NOTE — Progress Notes (Signed)
   Subjective:    Patient ID: Kimberly Stephenson, female    DOB: Feb 16, 1945, 68 y.o.   MRN: [id number]  HPI  She is here to assess active health issues & conditions. PMH, FH, & Social history verified & updated  Blood pressure not monitored. Compliant with anti hypertemsive medication. No lightheadedness or other adverse medication effect described.  A heart healthy /low salt diet is followed. No exercise program per se. Family history is + for CVA in PGF in 19s and premature CAD in maternal family.     Review of Systems  Significant headaches, epistaxis, chest pain, palpitations, exertional dyspnea, claudication, paroxysmal nocturnal dyspnea, or edema absent.      Objective:   Physical Exam Gen.: Healthy and well-nourished in appearance. Alert, appropriate and cooperative throughout exam. Appears younger than stated age  Head: Normocephalic without obvious abnormalities; hair fine  Eyes: No corneal or conjunctival inflammation noted. Pupils equal round reactive to light and accommodation. Extraocular motion intact.  Ears: External  ear exam reveals no significant lesions or deformities. Canals clear .TMs normal. Hearing is grossly normal bilaterally. Nose: External nasal exam reveals no deformity or inflammation. Nasal mucosa are pink and moist. No lesions or exudates noted.   Mouth: Oral mucosa and oropharynx reveal no lesions or exudates. Teeth in good repair. Neck: No deformities, masses, or tenderness noted. Range of motion & Thyroid normal Lungs: Normal respiratory effort; chest expands symmetrically. Lungs are clear to auscultation without rales, wheezes, or increased work of breathing. Heart: Normal rate and rhythm. Normal S1 and S2. No gallop, click, or rub. No murmur. Abdomen: Bowel sounds normal; abdomen soft and nontender. No masses, organomegaly or hernias noted. Genitalia: as per Gyn                                  Musculoskeletal/extremities: No deformity or scoliosis  noted of  the thoracic or lumbar spine.  No clubbing, cyanosis, edema, or significant extremity  deformity noted. Range of motion normal .Tone & strength normal. Hand joints normal Fingernail health good. Able to lie down & sit up w/o help. Negative SLR bilaterally Vascular: Carotid, radial artery, dorsalis pedis and  posterior tibial pulses are full and equal. No bruits present. Neurologic: Alert and oriented x3. Deep tendon reflexes symmetrical and normal.  Gait normal.      Skin: Intact without suspicious lesions or rashes. Lymph: No cervical, axillary lymphadenopathy present. Psych: Mood and affect are normal. Normally interactive                                                                                        Assessment & Plan:  See Current Assessment & Plan in Problem List under specific Diagnosis

## 2013-10-19 ENCOUNTER — Ambulatory Visit (INDEPENDENT_AMBULATORY_CARE_PROVIDER_SITE_OTHER): Payer: Medicare Other | Admitting: Internal Medicine

## 2013-10-19 ENCOUNTER — Encounter: Payer: Self-pay | Admitting: Internal Medicine

## 2013-10-19 VITALS — BP 110/84 | HR 107 | Temp 98.1°F | Resp 13 | Wt 170.5 lb

## 2013-10-19 DIAGNOSIS — H6982 Other specified disorders of Eustachian tube, left ear: Secondary | ICD-10-CM

## 2013-10-19 MED ORDER — PREDNISONE 20 MG PO TABS: 20.0000 mg | ORAL_TABLET | Freq: Two times a day (BID) | ORAL | Status: DC

## 2013-10-19 NOTE — Patient Instructions (Signed)
Plain Mucinex (NOT D) for thick secretions ;force NON dairy fluids .   Nasal cleansing in the shower as discussed with lather of mild shampoo.After 10 seconds wash off lather while  exhaling through nostrils. Make sure that all residual soap is removed to prevent irritation.  Flonase OR Nasacort AQ 1 spray in each nostril twice a day as needed. Use the "crossover" technique into opposite nostril spraying toward opposite ear @ 45 degree angle, not straight up into nostril.  Plain Allegra (NOT D )  160 daily , Loratidine 10 mg , OR Zyrtec 10 mg @ bedtime  as needed for itchy eyes & sneezing. Go to Web MD for eustachian tube dysfunction. Drink thin  fluids liberally through the day and chew sugarless gum .  Zicam Melts or Zinc lozenges as per package label for sore throat . Complementary options include  vitamin C 2000 mg daily; & Echinacea for 4-7 days.

## 2013-10-19 NOTE — Progress Notes (Signed)
Pre visit review using our clinic review tool, if applicable. No additional management support is needed unless otherwise documented below in the visit note. 
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
## 2013-11-06 ENCOUNTER — Other Ambulatory Visit: Payer: Self-pay

## 2013-11-06 MED ORDER — METOPROLOL SUCCINATE ER 50 MG PO TB24: ORAL_TABLET | ORAL | Status: DC

## 2013-11-11 ENCOUNTER — Other Ambulatory Visit: Payer: Self-pay | Admitting: Internal Medicine

## 2013-11-27 ENCOUNTER — Other Ambulatory Visit: Payer: Self-pay | Admitting: Internal Medicine

## 2014-01-22 ENCOUNTER — Other Ambulatory Visit: Payer: Self-pay | Admitting: Internal Medicine

## 2014-03-01 ENCOUNTER — Telehealth: Payer: Self-pay | Admitting: Internal Medicine

## 2014-03-01 NOTE — Telephone Encounter (Signed)
I will swab her nose at her office visit and send it to lab.

## 2014-03-01 NOTE — Telephone Encounter (Signed)
Phone call to patient. She states she is not going to "debate" with her insurance company. She wants the culture done since her dog has been on several antibiotics since June. She has an appointment to see you on Monday.

## 2014-03-01 NOTE — Telephone Encounter (Signed)
Patient is requesting a swab to see if she is a carrier of MRSA, b/c her Dog has K9 MRSA.  She would also like a VIT D test if she has not had one, b/c her daughter was just diagnosed with low VIT D.  Patient has scheduled an appointment for Monday at 3pm.  She would like to know if she needs to keep this appointment or if she can just go to the lab.

## 2014-03-01 NOTE — Telephone Encounter (Signed)
Bless you

## 2014-03-01 NOTE — Telephone Encounter (Signed)
She may want to run this by her insurance; there is no diagnosis in chart to support these . Insurance may not pay, especially for MRSA swab.Please do not shoot the messenger

## 2014-03-01 NOTE — Telephone Encounter (Signed)
Ask Lab if they can do nasal swab for MRSA. Thanks, [employer]

## 2014-03-04 ENCOUNTER — Encounter: Payer: Self-pay | Admitting: Internal Medicine

## 2014-03-04 ENCOUNTER — Other Ambulatory Visit: Payer: Medicare Other

## 2014-03-04 ENCOUNTER — Ambulatory Visit (INDEPENDENT_AMBULATORY_CARE_PROVIDER_SITE_OTHER): Payer: Medicare Other | Admitting: Internal Medicine

## 2014-03-04 VITALS — BP 119/96 | HR 86 | Temp 98.2°F | Ht 60.0 in | Wt 169.0 lb

## 2014-03-04 DIAGNOSIS — Z20818 Contact with and (suspected) exposure to other bacterial communicable diseases: Secondary | ICD-10-CM

## 2014-03-04 DIAGNOSIS — Z780 Asymptomatic menopausal state: Secondary | ICD-10-CM

## 2014-03-04 DIAGNOSIS — Z2089 Contact with and (suspected) exposure to other communicable diseases: Secondary | ICD-10-CM

## 2014-03-04 DIAGNOSIS — M4120 Other idiopathic scoliosis, site unspecified: Secondary | ICD-10-CM

## 2014-03-04 DIAGNOSIS — N951 Menopausal and female climacteric states: Secondary | ICD-10-CM

## 2014-03-04 DIAGNOSIS — Z8262 Family history of osteoporosis: Secondary | ICD-10-CM

## 2014-03-04 NOTE — Progress Notes (Signed)
Pre visit review using our clinic review tool, if applicable. No additional management support is needed unless otherwise documented below in the visit note. 

## 2014-03-04 NOTE — Patient Instructions (Addendum)

## 2014-03-04 NOTE — Progress Notes (Signed)
   Subjective:    Patient ID: Kimberly Stephenson, female    DOB: [birth year], 69 y.o.   MRN: [id number]  HPI Her dog has been diagnosed with MRSA dermatitis. This was in the setting of having taken antibiotics on 5 occasions. He was treated with chloramphenicol with resolution of the MRSA. After the antibiotic was stopped it did recur. She is concerned that she may be a MRSA carrier.  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  She's also concerned about her vitamin D status. Her daughter has severe vitamin D deficiency. Her mother had osteoporosis. She is postmenopausal but has normal bone density. The most recent chest x-ray revealed scoliosis.  See BP; she is not monitoring BP @ home.She attributes BP to walking for 1 hour today   Review of Systems  She has no fever, chills, sweats, nasal purulence.  She does have extrinsic rhinitis with nasal congestion. She uses saline cleanses as well as Flonase.  Rarely she will experience self limited epistaxis.      Objective:   Physical Exam  General appearance:Adequately nourished; no acute distress or increased work of breathing is present.  No  lymphadenopathy about the head, neck, or axilla noted.   Eyes: No conjunctival inflammation or lid edema is present. There is no scleral icterus.  Ears:  External ear exam shows no significant lesions or deformities.  Otoscopic examination reveals clear canals, tympanic membranes are intact bilaterally without bulging, retraction, inflammation or discharge.  Nose:  External nasal examination shows no deformity or inflammation.  Nares erythematous. No septal dislocation or deviation.No obstruction to airflow.   Oral exam: Dental hygiene is good; lips and gums are healthy appearing.There is no oropharyngeal erythema or exudate noted.   Neck:  No deformities, thyromegaly, masses, or tenderness noted.   Supple with full range of motion without pain.   Heart:  Normal  rate and regular rhythm. S1 and S2 normal without gallop, murmur, click, rub or other extra sounds.   Lungs:Chest clear to auscultation; no wheezes, rhonchi,rales ,or rubs present.  Extremities:  No cyanosis, edema, or clubbing  noted    Skin: Warm & dry w/o jaundice or tenting.  Scoliosis is not clinically apparent; it was noted in the lower t horacic spine on the chest x-ray which was reviewed with her.     Assessment & Plan:  #1 MRSA exposure from her dog as well as former students   #2 concerns as to risk of  vitamin D insufficiency; existing diagnostic codes do not cover vitamin D. Therefore she did decided not to have the test done as it would cost $70  #3 scoliosis, idiopathic, mild

## 2014-03-07 LAB — NASAL CULTURE (N/P): Organism ID, Bacteria: NORMAL

## 2014-05-02 ENCOUNTER — Other Ambulatory Visit: Payer: Self-pay

## 2014-05-02 MED ORDER — METOPROLOL SUCCINATE ER 50 MG PO TB24: ORAL_TABLET | ORAL | Status: DC

## 2014-05-02 MED ORDER — RAMIPRIL 5 MG PO CAPS: 5.0000 mg | ORAL_CAPSULE | Freq: Two times a day (BID) | ORAL | Status: DC

## 2014-06-11 ENCOUNTER — Ambulatory Visit: Payer: Medicare Other | Admitting: Internal Medicine

## 2014-06-14 ENCOUNTER — Encounter: Payer: Self-pay | Admitting: Internal Medicine

## 2014-06-14 ENCOUNTER — Ambulatory Visit (INDEPENDENT_AMBULATORY_CARE_PROVIDER_SITE_OTHER): Payer: Medicare Other | Admitting: Internal Medicine

## 2014-06-14 VITALS — BP 114/70 | HR 74 | Temp 98.2°F | Wt 170.0 lb

## 2014-06-14 DIAGNOSIS — J019 Acute sinusitis, unspecified: Secondary | ICD-10-CM | POA: Diagnosis not present

## 2014-06-14 DIAGNOSIS — R059 Cough, unspecified: Secondary | ICD-10-CM | POA: Insufficient documentation

## 2014-06-14 DIAGNOSIS — I10 Essential (primary) hypertension: Secondary | ICD-10-CM | POA: Diagnosis not present

## 2014-06-14 DIAGNOSIS — R05 Cough: Secondary | ICD-10-CM | POA: Diagnosis not present

## 2014-06-14 DIAGNOSIS — J0100 Acute maxillary sinusitis, unspecified: Secondary | ICD-10-CM | POA: Insufficient documentation

## 2014-06-14 MED ORDER — LEVOFLOXACIN 250 MG PO TABS: 250.0000 mg | ORAL_TABLET | Freq: Every day | ORAL | Status: DC

## 2014-06-14 NOTE — Patient Instructions (Signed)
Please take all new medication as prescribed - the antibiotic  OK to continue the OTC cough medicine as needed  Please continue all other medications as before, and refills have been done if requested.  Please have the pharmacy call with any other refills you may need.  Please keep your appointments with your specialists as you may have planned

## 2014-06-14 NOTE — Progress Notes (Signed)
Subjective:    Patient ID: Kimberly Stephenson, female    DOB: 1945-05-30, 69 y.o.   MRN: [id number]  HPI  Here with acute onset mild to mod 2 wks ST, HA, general weakness and malaise, with prod cough greenish sputum, but Pt denies chest pain, increased sob or doe, wheezing, orthopnea, PND, increased LE swelling, palpitations, dizziness or syncope.  Cough now improved, but still and possibly worsening sinus pain and congesation.  Last infection over 1 yr. Leaving for 89yo you mother bday soon Past Medical History  Diagnosis Date  . Other and unspecified hyperlipidemia     [place] study LDL goal=<130; NMR LDL  goal = < 160, ideally < 130  . Nonspecific elevation of levels of transaminase or lactic acid dehydrogenase (LDH)   . Polydipsia   . Other and unspecified ovarian cyst     PMHx  . Amaurosis fugax     x 3 ; resolved with BP meds  . Irritable bowel syndrome     Dr Earlean Shawl, GI  . Rosacea   . Unspecified essential hypertension   . Colitis, ischemic 2010    Dr. Earlean Shawl  . Sebaceous cyst      left buttock  . Milk intolerance    Past Surgical History  Procedure Laterality Date  . Cesarean section      x 2; G2 P2  . Tonsillectomy and adenoidectomy    . Peri ovarian cyst right resected  1980  . Appendectomy  1980  . Colonoscopy  2003, 2008    due 2018; Dr Earlean Shawl  . Upper gastrointestinal endoscopy  1986    reports that she quit smoking about 45 years ago. She has never used smokeless tobacco. She reports that she does not drink alcohol or use illicit drugs. family history includes Basal cell carcinoma in her mother; Cancer in her mother; Dementia in her father and paternal grandfather; Diabetes in her father, maternal grandfather, paternal grandmother, and sister; Heart attack in her maternal aunt; Heart attack (age of onset: 70) in her mother; Heart attack (age of onset: 19) in her father; Heart disease in her father; Heart failure (age of onset: 57) in her maternal aunt;  Hypertension (age of onset: 10) in her mother; Lung cancer in her maternal aunt and mother; Obesity in her sister; Other in an other family member; Prostate cancer in an other family member; Stroke in her paternal grandfather; Thyroid nodules in her sister. Allergies  Allergen Reactions  . Cephalosporins     urticaria  . Lamisil [Terbinafine Hcl]     rash  . Sulfonamide Derivatives     rash  . Amoxicillin-Pot Clavulanate     diarrhea  . Simvastatin     ?  . Ezetimibe     Fatigue & joint pain  . Ramipril     Chronic, recurrent throat clearing and vocal impairment   Current Outpatient Prescriptions on File Prior to Visit  Medication Sig Dispense Refill  . AMBULATORY NON FORMULARY MEDICATION Medication Name: CICIOPIROX topical solution - anti fungal    . AMBULATORY NON FORMULARY MEDICATION Apply topically as needed. Hydrocortisone 1% - Iodoquirol 1%    . Aspirin-Acetaminophen-Caffeine (EXCEDRIN PO) Take by mouth. Taking a [employer] brand equivalent       . Clindamycin-Benzoyl Per, Refr, (DUAC) gel Apply topically 2 (two) times daily.    . DiphenhydrAMINE HCl (BENADRYL ALLERGY PO) Take by mouth as needed.     . fluticasone (FLONASE) 50 MCG/ACT nasal spray INSTILL 1 SPRAY INTO  EACH NOSTRIL TWICE DAILY IF NEEDED FOR RHINITIS 16 g 5  . levocetirizine (XYZAL) 5 MG tablet take 1 tablet by mouth every evening 30 tablet 5  . metoprolol succinate (TOPROL-XL) 50 MG 24 hr tablet take 1 tablet by mouth once daily 90 tablet 1  . mometasone (ELOCON) 0.1 % cream as needed.    . Multiple Vitamins-Calcium (ONE-A-DAY WOMENS PO) Take 1 each by mouth daily.      . predniSONE (DELTASONE) 20 MG tablet Take 1 tablet (20 mg total) by mouth 2 (two) times daily. (Patient not taking: Reported on 06/14/2014) 10 tablet 0  . ramipril (ALTACE) 5 MG capsule Take 1 capsule (5 mg total) by mouth 2 (two) times daily. 180 capsule 1   No current facility-administered medications on file prior to visit.   Review of  Systems  Constitutional: Negative for unusual diaphoresis or night sweats HENT: Negative for ringing in ear or discharge Eyes: Negative for double vision or worsening visual disturbance.  Respiratory: Negative for choking and stridor.   Gastrointestinal: Negative for vomiting or other signifcant bowel change Genitourinary: Negative for hematuria or change in urine volume.  Musculoskeletal: Negative for other MSK pain or swelling Skin: Negative for color change and worsening wound.  Neurological: Negative for tremors and numbness other than noted  Psychiatric/Behavioral: Negative for decreased concentration or agitation other than above       Objective:   Physical Exam BP 114/70 mmHg  Pulse 74  Temp(Src) 98.2 F (36.8 C) (Oral)  Wt 170 lb (77.111 kg)  SpO2 97% VS noted, mild ill Constitutional: Pt appears in no significant distress HENT: Head: NCAT.  Right Ear: External ear normal.  Left Ear: External ear normal.  Eyes: . Pupils are equal, round, and reactive to light. Conjunctivae and EOM are normal Bilat tm's with mild erythema.  Max sinus areas mild tender.  Pharynx with mild erythema, no exudate Neck: Normal range of motion. Neck supple.  Cardiovascular: Normal rate and regular rhythm.   Pulmonary/Chest: Effort normal and breath sounds without rales or wheezing. , slight decreased bilat Neurological: Pt is alert. Not confused , motor grossly intact Skin: Skin is warm. No rash, no LE edema Psychiatric: Pt behavior is normal. No agitation.        Assessment & Plan:

## 2014-06-14 NOTE — Assessment & Plan Note (Signed)
stable overall by history and exam, recent data reviewed with pt, and pt to continue medical treatment as before,  to f/u any worsening symptoms or concerns BP Readings from Last 3 Encounters:  06/14/14 114/70  03/04/14 119/96  10/19/13 110/84

## 2014-06-14 NOTE — Assessment & Plan Note (Signed)
Acute on chroinc sinus, possible bacterial superinfection, for levaquin asd,  to f/u any worsening symptoms or concerns, cont flonase asd as well

## 2014-06-14 NOTE — Assessment & Plan Note (Signed)
C/w recent bronchitis acute, ? Mild improvement - for levaquin asd, otc cough med, declines cxr,  to f/u any worsening symptoms or concerns

## 2014-06-14 NOTE — Progress Notes (Signed)
Pre visit review using our clinic review tool, if applicable. No additional management support is needed unless otherwise documented below in the visit note. 

## 2014-07-01 ENCOUNTER — Encounter: Payer: Self-pay | Admitting: Internal Medicine

## 2014-07-01 ENCOUNTER — Ambulatory Visit (INDEPENDENT_AMBULATORY_CARE_PROVIDER_SITE_OTHER): Payer: Medicare Other | Admitting: Internal Medicine

## 2014-07-01 ENCOUNTER — Other Ambulatory Visit (INDEPENDENT_AMBULATORY_CARE_PROVIDER_SITE_OTHER): Payer: Medicare Other

## 2014-07-01 VITALS — BP 136/84 | HR 83 | Temp 98.4°F | Resp 16 | Wt 168.0 lb

## 2014-07-01 DIAGNOSIS — A09 Infectious gastroenteritis and colitis, unspecified: Secondary | ICD-10-CM | POA: Diagnosis not present

## 2014-07-01 DIAGNOSIS — R05 Cough: Secondary | ICD-10-CM

## 2014-07-01 DIAGNOSIS — J310 Chronic rhinitis: Secondary | ICD-10-CM

## 2014-07-01 DIAGNOSIS — R059 Cough, unspecified: Secondary | ICD-10-CM

## 2014-07-01 DIAGNOSIS — R197 Diarrhea, unspecified: Secondary | ICD-10-CM

## 2014-07-01 LAB — CBC WITH DIFFERENTIAL/PLATELET
BASOS ABS: 0.1 10*3/uL (ref 0.0–0.1)
BASOS PCT: 0.8 % (ref 0.0–3.0)
Eosinophils Absolute: 0.2 10*3/uL (ref 0.0–0.7)
Eosinophils Relative: 3.7 % (ref 0.0–5.0)
HCT: 40.5 % (ref 36.0–46.0)
HEMOGLOBIN: 13.8 g/dL (ref 12.0–15.0)
LYMPHS PCT: 32 % (ref 12.0–46.0)
Lymphs Abs: 2.1 10*3/uL (ref 0.7–4.0)
MCHC: 34 g/dL (ref 30.0–36.0)
MCV: 89.4 fl (ref 78.0–100.0)
MONO ABS: 0.7 10*3/uL (ref 0.1–1.0)
Monocytes Relative: 11 % (ref 3.0–12.0)
NEUTROS ABS: 3.4 10*3/uL (ref 1.4–7.7)
NEUTROS PCT: 52.5 % (ref 43.0–77.0)
Platelets: 298 10*3/uL (ref 150.0–400.0)
RBC: 4.53 Mil/uL (ref 3.87–5.11)
RDW: 13.2 % (ref 11.5–15.5)
WBC: 6.5 10*3/uL (ref 4.0–10.5)

## 2014-07-01 MED ORDER — MONTELUKAST SODIUM 10 MG PO TABS: 10.0000 mg | ORAL_TABLET | Freq: Every day | ORAL | Status: DC

## 2014-07-01 MED ORDER — HYDROCODONE-HOMATROPINE 5-1.5 MG/5ML PO SYRP: 5.0000 mL | ORAL_SOLUTION | Freq: Four times a day (QID) | ORAL | Status: DC | PRN

## 2014-07-01 NOTE — Progress Notes (Signed)
Pre visit review using our clinic review tool, if applicable. No additional management support is needed unless otherwise documented below in the visit note. 

## 2014-07-01 NOTE — Patient Instructions (Signed)
Please take a probiotic , Florastor OR Align, every day if the bowels are loose. This will replace the normal bacteria which  are necessary for formation of normal stool and processing of food.  Plain Mucinex (NOT D) for thick secretions ;force NON dairy fluids .   Nasal cleansing in the shower as discussed with lather of mild shampoo.After 10 seconds wash off lather while  exhaling through nostrils. Make sure that all residual soap is removed to prevent irritation.  Flonase OR Nasacort AQ 1 spray in each nostril twice a day as needed. Use the "crossover" technique into opposite nostril spraying toward opposite ear @ 45 degree angle, not straight up into nostril.  Plain Allegra (NOT D )  160 daily , Loratidine 10 mg , OR Zyrtec 10 mg @ bedtime  as needed for itchy eyes & sneezing.

## 2014-07-01 NOTE — Progress Notes (Signed)
   Subjective:    Patient ID: Kimberly Stephenson, female    DOB: [birth year], 69 y.o.   MRN: [id number]  HPI She was seen 06/14/14 after being ill with respiratory tract symptoms for 2 weeks. The symptoms initially were sore throat; anterior/upper chest discomfort; and postnasal drainage. She was placed on Levaquin for 10 days. She continues to cough with production of thick white material. At times the cough is paroxysmal. She has taken 3 bottles of Robitussin-DM as well as taken Sudafed intermittently for the postnasal drainage. She states her sinuses are 50% better but she still has "blockage" in the right maxillary sinus area.She has a history of chronic sinusitis  She states that she's had improvement in the past with Bactrim but sulfa is listed as an allergy. She had been taken off ramipril, an ACE inhibitor in the past but she was intolerant to the ARB Rxed.  She has history of reflux and hiatal hernia which is controlled with lifestyle changes.  Review of Systems She denies frank sinus pain or nasal purulence. She has had no fever, chills, or sweats. She also denies sneezing or itchy, watery eyes.  On 6/21 she had 4 diarrheal stools 2 of which were bloody.This occurred over several hours. This occurred the last day of  Levaquin.  She feels the bowel changes were related to having been bitten on the right knee by a venomous spider or a garden snake. She had myalgias 24-36 hours after that. Her stools are described as normal.  She is concerned she may be @ risk for Giardia as her rescue beagle has this.  She has been followed by Dr. Morene Rankins  but states that she will not have a sigmoidoscopy or colonoscopy as part of any surveillance or diagnostic evaluation.    Objective:   Physical Exam Pertinent or positive findings include: Moderately severe erythema of the nasal mucosa is present without exudate. She has intermittent raspy cough productive of some pale gray plugs. Abdomen is nontender.  She has 4 small excoriated papules over the right knee with no evidence of pustules or cellulitis.  General appearance :adequately nourished; in no distress.  Eyes: No conjunctival inflammation or scleral icterus is present.  Oral exam:  Lips and gums are healthy appearing.There is no oropharyngeal erythema or exudate noted. Dental hygiene is good.  Heart:  Normal rate and regular rhythm. S1 and S2 normal without gallop, murmur, click, rub or other extra sounds    Lungs:Chest clear to auscultation; no wheezes, rhonchi,rales ,or rubs present.No increased work of breathing.   Abdomen: bowel sounds normal, soft and non-tender without masses, organomegaly or hernias noted.  No guarding or rebound.   Vascular : all pulses equal ; no bruits present.  Skin:Warm & dry.  Intact without suspicious lesions or rashes ; no tenting or jaundice   Lymphatic: No lymphadenopathy is noted about the head, neck, axilla.   Neuro: Strength, tone & DTRs normal.         Assessment & Plan:  #1 cough  #2 nonallergic rhinitis  #3 self-limited bloody diarrhea in the context of 10 days of Levaquin  #4 bite right knee with no evidence of cellulitis or pustule formation. Venomous spider bite is clinically not suggested.  See orders and recommendations. I discussed the risk of C. difficile colitis with her if she takes additional anabiotics.

## 2014-07-11 ENCOUNTER — Other Ambulatory Visit: Payer: Medicare Other

## 2014-08-02 ENCOUNTER — Telehealth: Payer: Self-pay | Admitting: Internal Medicine

## 2014-08-02 NOTE — Telephone Encounter (Signed)
Called patient, left voice message informed about setting up appointment.

## 2014-08-14 ENCOUNTER — Other Ambulatory Visit: Payer: Self-pay | Admitting: Internal Medicine

## 2014-08-14 ENCOUNTER — Other Ambulatory Visit: Payer: Self-pay

## 2014-08-14 MED ORDER — LEVOCETIRIZINE DIHYDROCHLORIDE 5 MG PO TABS: 5.0000 mg | ORAL_TABLET | Freq: Every evening | ORAL | Status: DC

## 2014-09-13 ENCOUNTER — Encounter: Payer: Self-pay | Admitting: Internal Medicine

## 2014-09-13 ENCOUNTER — Ambulatory Visit (INDEPENDENT_AMBULATORY_CARE_PROVIDER_SITE_OTHER): Payer: Medicare Other | Admitting: Internal Medicine

## 2014-09-13 ENCOUNTER — Other Ambulatory Visit (INDEPENDENT_AMBULATORY_CARE_PROVIDER_SITE_OTHER): Payer: Medicare Other

## 2014-09-13 VITALS — BP 122/80 | HR 77 | Temp 98.7°F | Resp 14 | Ht 60.0 in | Wt 170.0 lb

## 2014-09-13 DIAGNOSIS — E785 Hyperlipidemia, unspecified: Secondary | ICD-10-CM

## 2014-09-13 DIAGNOSIS — I10 Essential (primary) hypertension: Secondary | ICD-10-CM

## 2014-09-13 DIAGNOSIS — Z23 Encounter for immunization: Secondary | ICD-10-CM

## 2014-09-13 DIAGNOSIS — R232 Flushing: Secondary | ICD-10-CM

## 2014-09-13 DIAGNOSIS — R197 Diarrhea, unspecified: Secondary | ICD-10-CM

## 2014-09-13 DIAGNOSIS — K625 Hemorrhage of anus and rectum: Secondary | ICD-10-CM | POA: Diagnosis not present

## 2014-09-13 LAB — CBC WITH DIFFERENTIAL/PLATELET
BASOS ABS: 0.1 10*3/uL (ref 0.0–0.1)
BASOS PCT: 1 % (ref 0.0–3.0)
EOS ABS: 0.2 10*3/uL (ref 0.0–0.7)
EOS PCT: 2.9 % (ref 0.0–5.0)
HCT: 40 % (ref 36.0–46.0)
HEMOGLOBIN: 13.6 g/dL (ref 12.0–15.0)
LYMPHS ABS: 1.9 10*3/uL (ref 0.7–4.0)
LYMPHS PCT: 33.9 % (ref 12.0–46.0)
MCHC: 34.1 g/dL (ref 30.0–36.0)
MCV: 88.6 fl (ref 78.0–100.0)
Monocytes Absolute: 0.6 10*3/uL (ref 0.1–1.0)
Monocytes Relative: 10.4 % (ref 3.0–12.0)
Neutro Abs: 2.8 10*3/uL (ref 1.4–7.7)
Neutrophils Relative %: 51.8 % (ref 43.0–77.0)
Platelets: 238 10*3/uL (ref 150.0–400.0)
RBC: 4.51 Mil/uL (ref 3.87–5.11)
RDW: 12.9 % (ref 11.5–15.5)
WBC: 5.5 10*3/uL (ref 4.0–10.5)

## 2014-09-13 LAB — LIPID PANEL
CHOLESTEROL: 205 mg/dL — AB (ref 0–200)
HDL: 62.8 mg/dL (ref >39.00)
LDL Cholesterol: 119 mg/dL — ABNORMAL HIGH (ref 0–99)
NONHDL: 141.93
TRIGLYCERIDES: 114 mg/dL (ref 0.0–149.0)
Total CHOL/HDL Ratio: 3
VLDL: 22.8 mg/dL (ref 0.0–40.0)

## 2014-09-13 LAB — HEPATIC FUNCTION PANEL
ALBUMIN: 4 g/dL (ref 3.5–5.2)
ALK PHOS: 78 U/L (ref 39–117)
ALT: 21 U/L (ref 0–35)
AST: 22 U/L (ref 0–37)
Bilirubin, Direct: 0.1 mg/dL (ref 0.0–0.3)
TOTAL PROTEIN: 6.9 g/dL (ref 6.0–8.3)
Total Bilirubin: 0.5 mg/dL (ref 0.2–1.2)

## 2014-09-13 LAB — TSH: TSH: 2.37 u[IU]/mL (ref 0.35–4.50)

## 2014-09-13 LAB — BASIC METABOLIC PANEL
BUN: 11 mg/dL (ref 6–23)
CALCIUM: 9.5 mg/dL (ref 8.4–10.5)
CO2: 28 mEq/L (ref 19–32)
CREATININE: 0.77 mg/dL (ref 0.40–1.20)
Chloride: 106 mEq/L (ref 96–112)
GFR: 79.51 mL/min (ref >60.00)
GLUCOSE: 92 mg/dL (ref 70–99)
Potassium: 4.4 mEq/L (ref 3.5–5.1)
Sodium: 141 mEq/L (ref 135–145)

## 2014-09-13 NOTE — Addendum Note (Signed)
Addended by: Levonne Lapping on: 09/13/2014 02:52 PM   Modules accepted: Orders

## 2014-09-13 NOTE — Progress Notes (Signed)
   Subjective:    Patient ID: Kimberly Stephenson, female    DOB: 10-Mar-1945, 69 y.o.   MRN: [id number]  HPI  The patient is here to assess status of active health conditions.  PMH, FH, & Social History reviewed & updated.No change in [place] as recorded.  She has red meat once a week; she does not add salt to food or ingest excess fried foods. Her only exercise for the last 2 months has been yardwork but @ a high level.  She's been compliant with her medicines without adverse effects. She does want to discontinue or decrease the blood pressure medicines. Records taken while she was exercising @ the gym revealed ranges of 102/66-124/76.  Review of Systems Chest pain, palpitations, tachycardia, exertional dyspnea, paroxysmal nocturnal dyspnea, claudication or edema are absent. No unexplained weight loss, abdominal pain, significant dyspepsia, dysphagia, melena,  or persistently small caliber stools.  She's had intermittent rectal bleeding and: The last episode was in June. She emphatically states this is due to Escherichia coli infections. She has not had a colonoscopy since 2003. The standard of care was reviewed.  She's had 3 episodes in the last 18 months of bright red flushing of her neck and face followed by frank diarrhea. The last episode was in August of this year.     Objective:   Physical Exam  General appearance :adequately nourished; in no distress.  Eyes: No conjunctival inflammation or scleral icterus is present.She has bilateral ptosis.  Oral exam:  Lips and gums are healthy appearing.There is no oropharyngeal erythema or exudate noted. Dental hygiene is good.  Heart:  Normal rate and regular rhythm. S1 and S2 normal without gallop, murmur, click, rub or other extra sounds    Lungs:Chest clear to auscultation; no wheezes, rhonchi,rales ,or rubs present.No increased work of breathing.   Abdomen: Slightly protuberant; bowel sounds normal, soft and non-tender without masses,  organomegaly or hernias noted.  No guarding or rebound.  Vascular : all pulses equal ; no bruits present.  Skin:Warm & dry.  Intact without suspicious lesions or rashes ; no tenting or jaundice   Lymphatic: No lymphadenopathy is noted about the head, neck, axilla.   Neuro: Strength, tone & DTRs normal.     Assessment & Plan:  See Current Assessment & Plan in Problem List under specific Diagnosis  The intermittent rectal bleeding should be evaluated by her Gastroenterologist, Dr. Earlean Shawl. She is long overdue for repeat colonoscopy.  The paroxysmal episodes of flushing and diarrhea should be evaluated by an Endocrinology with labs while actively having symptoms to rule out carcinoid syndrome.

## 2014-09-13 NOTE — Progress Notes (Signed)
Pre visit review using our clinic review tool, if applicable. No additional management support is needed unless otherwise documented below in the visit note. 

## 2014-09-13 NOTE — Assessment & Plan Note (Signed)
Blood pressure goals reviewed. BMET 

## 2014-09-13 NOTE — Patient Instructions (Signed)
Minimal Blood Pressure Goal= AVERAGE < 140/90;  Ideal is an AVERAGE < 135/85. This AVERAGE should be calculated from @ least 5-7 BP readings taken @ different times of day on different days of week. You should not respond to isolated BP readings , but rather the AVERAGE for that week .Please bring your  blood pressure cuff to office visits to verify that it is reliable.It  can also be checked against the blood pressure device at the pharmacy. Finger or wrist cuffs are not dependable; an arm cuff is.  The GI and Endocrinology referrals will be scheduled and you'll be notified of the time.Please call the Referral Co-Ordinator @ [phone] if you have not been notified of appointment time within 7-10 days.

## 2014-09-13 NOTE — Assessment & Plan Note (Signed)
Lipids, LFTs, TSH  

## 2014-11-06 ENCOUNTER — Other Ambulatory Visit: Payer: Self-pay | Admitting: Emergency Medicine

## 2014-11-06 ENCOUNTER — Telehealth: Payer: Self-pay | Admitting: Internal Medicine

## 2014-11-06 MED ORDER — METOPROLOL SUCCINATE ER 50 MG PO TB24: ORAL_TABLET | ORAL | Status: DC

## 2014-11-06 NOTE — Telephone Encounter (Signed)
Pt request refill for metoprolol succinate (TOPROL-XL) 50 MG 24 hr tablet to be send to [facility] for 90 days supply.

## 2014-11-07 ENCOUNTER — Other Ambulatory Visit: Payer: Self-pay | Admitting: Emergency Medicine

## 2014-11-07 MED ORDER — RAMIPRIL 5 MG PO CAPS: 5.0000 mg | ORAL_CAPSULE | Freq: Two times a day (BID) | ORAL | Status: DC

## 2015-01-28 ENCOUNTER — Ambulatory Visit (INDEPENDENT_AMBULATORY_CARE_PROVIDER_SITE_OTHER): Payer: Medicare Other | Admitting: Internal Medicine

## 2015-01-28 ENCOUNTER — Encounter: Payer: Self-pay | Admitting: Internal Medicine

## 2015-01-28 VITALS — BP 126/84 | HR 76 | Temp 98.1°F | Resp 16 | Ht 60.0 in | Wt 174.0 lb

## 2015-01-28 DIAGNOSIS — I10 Essential (primary) hypertension: Secondary | ICD-10-CM

## 2015-01-28 DIAGNOSIS — R059 Cough, unspecified: Secondary | ICD-10-CM

## 2015-01-28 DIAGNOSIS — H47011 Ischemic optic neuropathy, right eye: Secondary | ICD-10-CM

## 2015-01-28 DIAGNOSIS — R05 Cough: Secondary | ICD-10-CM

## 2015-01-28 NOTE — Progress Notes (Signed)
Pre visit review using our clinic review tool, if applicable. No additional management support is needed unless otherwise documented below in the visit note. 

## 2015-01-28 NOTE — Assessment & Plan Note (Signed)
Controlled Continue current meds for now, but will d/c ramipril after her tests are complete due to cough.  Will likely need a different BP med Work on weight loss Increase exercise Monitor BP regularly

## 2015-01-28 NOTE — Patient Instructions (Addendum)
  We have reviewed your prior records including labs and tests today.  Test(s) ordered today. We will call you to schedule these and will call you with the results.   Medications reviewed and updated.  No changes recommended at this time.  Work on the lifestyle changes discussed.    We will schedule a follow up visit based on the tests.

## 2015-01-28 NOTE — Progress Notes (Signed)
Subjective:    Patient ID: Kimberly Stephenson, female    DOB: 1945-11-15, 70 y.o.   MRN: [id number]  HPI She is here to establish with a new pcp.   Hypertension: She is taking her medication daily. She thinks her ramipril is causing a cough and would like to ideally come off the medication, but wants to have the necessary tests below first prior to making changes.  She is compliant with a low sodium diet.  She denies chest pain, palpitations, edema, shortness of breath and lightheadedness. She is exercising regularly, but wants to increase her exercise.  She does not monitor her blood pressure at home, but she has a cuff at home and can monitor it or do it at the [facility].    Cough:  Besides the ramipril she also has allergies which she knows may be contributing to her cough.   She understands heartburn may contribute as well.   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 Possibly related to undiagnosed htn.  She follows with Dr Katy Fitch.  Last summer she had a visual disturbance and saw Dr Katy Fitch - it cleared after three months.  She followed up this month (01/15/15).  He thinks she may have a narrowing of the branching artery of the optic nerve - thought it was a stroke.  She has a loss of vision from 9-12 o'clock on right periphery.  He recommended test including echo, carotid eval and MRI of brain w/ wo contrast.    Last Wednesday she had two episodes of migraines in one day which is unusual.  She takes excedrin daily.    Medications and allergies reviewed with patient and updated if appropriate.  Patient Active Problem List   Diagnosis Date Noted  . Cough 06/14/2014  . Acute sinus infection 06/14/2014  . Cyst of buttocks 10/06/2012  . RHINITIS 07/21/2009  . NONSPECIFIC ABNORMAL ELECTROCARDIOGRAM 07/19/2008  . Hyperlipemia 12/13/2006  . AMAUROSIS FUGAX 11/30/2006  . Essential hypertension 11/30/2006  . Irritable bowel syndrome 11/30/2006  .  OVARIAN CYST 11/30/2006  . Rosacea 11/30/2006  . NONSPEC ELEVATION OF LEVELS OF TRANSAMINASE/LDH 11/30/2006    Current Outpatient Prescriptions on File Prior to Visit  Medication Sig Dispense Refill  . AMBULATORY NON FORMULARY MEDICATION Medication Name: CICIOPIROX topical solution - anti fungal    . AMBULATORY NON FORMULARY MEDICATION Apply topically as needed. Hydrocortisone 1% - Iodoquirol 1%    . Clindamycin-Benzoyl Per, Refr, (DUAC) gel Apply topically 2 (two) times daily.    . DiphenhydrAMINE HCl (BENADRYL ALLERGY PO) Take by mouth as needed.     . fluticasone (FLONASE) 50 MCG/ACT nasal spray INSTILL 1 SPRAY INTO EACH NOSTRIL TWICE DAILY IF NEEDED FOR RHINITIS 16 g 5  . levocetirizine (XYZAL) 5 MG tablet Take 1 tablet (5 mg total) by mouth every evening. 30 tablet 5  . metoprolol succinate (TOPROL-XL) 50 MG 24 hr tablet take 1 tablet by mouth once daily 90 tablet 1  . mometasone (ELOCON) 0.1 % cream as needed.    . Multiple Vitamins-Calcium (ONE-A-DAY WOMENS PO) Take 1 each by mouth daily.      Marland Kitchen OVER THE COUNTER MEDICATION 325 mg daily.    . ramipril (ALTACE) 5 MG capsule Take 1 capsule (5 mg total) by mouth 2 (two) times daily. 180 capsule 1   No current facility-administered medications on file prior to visit.    Past Medical History  Diagnosis Date  .  Other and unspecified hyperlipidemia     [place] study LDL goal=<130; NMR LDL  goal = < 160, ideally < 130  . Nonspecific elevation of levels of transaminase or lactic acid dehydrogenase (LDH)   . Polydipsia   . Other and unspecified ovarian cyst     PMHx  . Amaurosis fugax     x 3 ; resolved with BP meds  . Irritable bowel syndrome     Dr Earlean Shawl, GI  . Rosacea   . Unspecified essential hypertension   . Colitis, ischemic ([place]) 2010    Dr. Earlean Shawl  . Sebaceous cyst      left buttock  . Milk intolerance ([place])     Past Surgical History  Procedure Laterality Date  . Cesarean section      x 2; G2 P2  . Tonsillectomy  and adenoidectomy    . Peri ovarian cyst right resected  1980  . Appendectomy  1980  . Colonoscopy  2003, 2008    due 2018; Dr Earlean Shawl  . Upper gastrointestinal endoscopy  1986    Social History   Social History  . Marital Status: Married    Spouse Name: N/A  . Number of Children: N/A  . Years of Education: N/A   Occupational History  . Teacher    Social History Main Topics  . Smoking status: Former Smoker    Quit date: 01/04/1969  . Smokeless tobacco: Never Used     Comment: smoked [place], up to 2 ppd  . Alcohol Use: No     Comment:  very rarely, < 1 glass of wine/ month  . Drug Use: No  . Sexual Activity: Not Asked   Other Topics Concern  . 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 Diabetes Sister   . Diabetes Father   . Dementia Father   . Heart attack Father 11  . Heart disease Father   . Diabetes Maternal Grandfather   . Heart attack Maternal Aunt      X 2;ages 56  & 65  . Lung cancer Maternal Aunt     smoker  . Stroke Paternal Grandfather   . Dementia Paternal Grandfather   . Heart failure Maternal Aunt 62  . Basal cell carcinoma Mother   . Hypertension Mother 86    Angioplasty  . Lung cancer Mother     Small cell  . Heart attack Mother 50  . Cancer Mother     lung and skin (basal cell carcinoma)  . Obesity Sister     x 2  . Thyroid nodules Sister   . Prostate cancer      nephew  . Other      PTH nodule, nephew  . Diabetes Paternal Grandmother     Review of Systems  Constitutional: Negative for fever and chills.  Respiratory: Positive for cough. Negative for shortness of breath and wheezing.   Cardiovascular: Negative for chest pain, palpitations and leg swelling.  Neurological: Positive for numbness (fingers both hands) and headaches (center of head, aura - increased over past three years just aura). Negative for dizziness,  weakness and light-headedness.       Objective:   Filed Vitals:   01/28/15 0934  BP: 126/84  Pulse: 76  Temp: 98.1 F (36.7 C)  Resp: 16   Filed Weights  01/28/15 0934  Weight: 174 lb (78.926 kg)   Body mass index is 33.98 kg/(m^2).   Physical Exam Constitutional: Appears well-developed and well-nourished. No distress.  Neck: Neck supple. No tracheal deviation present. No thyromegaly present.  No carotid bruit. No cervical adenopathy.   Cardiovascular: Normal rate, regular rhythm and normal heart sounds.   1/6 systolic murmur.  No edema Pulmonary/Chest: Effort normal and breath sounds normal. No respiratory distress. No wheezes.     Assessment & Plan:    See Problem List for Assessment and Plan of chronic medical problems.

## 2015-01-28 NOTE — Assessment & Plan Note (Signed)
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

## 2015-01-28 NOTE — Assessment & Plan Note (Signed)
Ramipril may be contributing, but she will work on controlling her allergies and avoid GERD Work on weight loss Will d/c ramipril and adjust BP meds after ordered tests are complete

## 2015-02-17 ENCOUNTER — Telehealth: Payer: Self-pay | Admitting: *Deleted

## 2015-02-17 DIAGNOSIS — H47011 Ischemic optic neuropathy, right eye: Secondary | ICD-10-CM

## 2015-02-17 NOTE — Telephone Encounter (Signed)
Per [place] need order for US carotid change to VAS [id number] location [facility]. Dr. Quay Burow place wrong order...Johny Chess

## 2015-02-20 ENCOUNTER — Ambulatory Visit (HOSPITAL_COMMUNITY)
Admission: RE | Admit: 2015-02-20 | Discharge: 2015-02-20 | Disposition: A | Discharge status: Home / Self Care | Payer: Medicare Other | Source: Ambulatory Visit | Attending: Cardiology | Admitting: Cardiology

## 2015-02-20 ENCOUNTER — Other Ambulatory Visit: Payer: Self-pay | Admitting: Internal Medicine

## 2015-02-20 DIAGNOSIS — I6523 Occlusion and stenosis of bilateral carotid arteries: Secondary | ICD-10-CM | POA: Insufficient documentation

## 2015-02-20 DIAGNOSIS — I6501 Occlusion and stenosis of right vertebral artery: Secondary | ICD-10-CM | POA: Diagnosis not present

## 2015-02-20 DIAGNOSIS — I10 Essential (primary) hypertension: Secondary | ICD-10-CM | POA: Insufficient documentation

## 2015-02-20 DIAGNOSIS — H47011 Ischemic optic neuropathy, right eye: Secondary | ICD-10-CM

## 2015-02-20 DIAGNOSIS — E785 Hyperlipidemia, unspecified: Secondary | ICD-10-CM | POA: Diagnosis not present

## 2015-02-20 DIAGNOSIS — H539 Unspecified visual disturbance: Secondary | ICD-10-CM

## 2015-02-20 DIAGNOSIS — R209 Unspecified disturbances of skin sensation: Secondary | ICD-10-CM

## 2015-02-26 ENCOUNTER — Ambulatory Visit (HOSPITAL_COMMUNITY): Payer: Medicare Other | Attending: Cardiology

## 2015-02-26 ENCOUNTER — Other Ambulatory Visit: Payer: Self-pay

## 2015-02-26 DIAGNOSIS — I10 Essential (primary) hypertension: Secondary | ICD-10-CM | POA: Diagnosis not present

## 2015-02-26 DIAGNOSIS — Z87891 Personal history of nicotine dependence: Secondary | ICD-10-CM | POA: Insufficient documentation

## 2015-02-26 DIAGNOSIS — H47011 Ischemic optic neuropathy, right eye: Secondary | ICD-10-CM | POA: Insufficient documentation

## 2015-02-26 DIAGNOSIS — I351 Nonrheumatic aortic (valve) insufficiency: Secondary | ICD-10-CM | POA: Diagnosis not present

## 2015-02-26 DIAGNOSIS — Z8249 Family history of ischemic heart disease and other diseases of the circulatory system: Secondary | ICD-10-CM | POA: Insufficient documentation

## 2015-02-27 ENCOUNTER — Other Ambulatory Visit: Payer: Self-pay | Admitting: Internal Medicine

## 2015-02-27 ENCOUNTER — Encounter: Payer: Self-pay | Admitting: Internal Medicine

## 2015-02-27 DIAGNOSIS — H47011 Ischemic optic neuropathy, right eye: Secondary | ICD-10-CM

## 2015-03-04 ENCOUNTER — Ambulatory Visit (HOSPITAL_COMMUNITY): Admission: RE | Admit: 2015-03-04 | Payer: Medicare Other | Source: Ambulatory Visit

## 2015-03-10 ENCOUNTER — Telehealth: Payer: Self-pay | Admitting: Internal Medicine

## 2015-03-10 DIAGNOSIS — H547 Unspecified visual loss: Secondary | ICD-10-CM

## 2015-03-10 NOTE — Telephone Encounter (Signed)
Spoke with pt. CK was needed. Labs have been entered.

## 2015-03-10 NOTE — Telephone Encounter (Signed)
Patient has an MRI scheduled and radiology states that she needs current blood work. She states that the MRI will be on 03/24/2015 at [place]. She is asking that the lab orders be placed ASAP.   Please call the patient at the number attached once the orders are placed

## 2015-03-11 ENCOUNTER — Other Ambulatory Visit (INDEPENDENT_AMBULATORY_CARE_PROVIDER_SITE_OTHER): Payer: Medicare Other

## 2015-03-11 DIAGNOSIS — H547 Unspecified visual loss: Secondary | ICD-10-CM | POA: Diagnosis not present

## 2015-03-11 LAB — CK: CK TOTAL: 76 U/L (ref 7–177)

## 2015-03-24 ENCOUNTER — Ambulatory Visit (HOSPITAL_COMMUNITY)
Admission: RE | Admit: 2015-03-24 | Discharge: 2015-03-24 | Disposition: A | Discharge status: Home / Self Care | Payer: Medicare Other | Source: Ambulatory Visit | Attending: Internal Medicine | Admitting: Internal Medicine

## 2015-03-24 ENCOUNTER — Ambulatory Visit: Payer: Medicare Other | Admitting: Neurology

## 2015-03-24 DIAGNOSIS — H47011 Ischemic optic neuropathy, right eye: Secondary | ICD-10-CM

## 2015-03-31 ENCOUNTER — Encounter: Payer: Self-pay | Admitting: Internal Medicine

## 2015-03-31 ENCOUNTER — Ambulatory Visit (INDEPENDENT_AMBULATORY_CARE_PROVIDER_SITE_OTHER): Payer: Medicare Other | Admitting: Internal Medicine

## 2015-03-31 VITALS — BP 128/88 | HR 77 | Temp 98.4°F | Resp 16 | Wt 173.0 lb

## 2015-03-31 DIAGNOSIS — H47011 Ischemic optic neuropathy, right eye: Secondary | ICD-10-CM | POA: Diagnosis not present

## 2015-03-31 DIAGNOSIS — Z139 Encounter for screening, unspecified: Secondary | ICD-10-CM

## 2015-03-31 MED ORDER — ASPIRIN EC 325 MG PO TBEC: 325.0000 mg | DELAYED_RELEASE_TABLET | Freq: Every day | ORAL | Status: DC

## 2015-03-31 NOTE — Progress Notes (Signed)
Pre visit review using our clinic review tool, if applicable. No additional management support is needed unless otherwise documented below in the visit note. 

## 2015-03-31 NOTE — Progress Notes (Signed)
Subjective:    Patient ID: Kimberly Stephenson, female    DOB: 1945-03-02, 70 y.o.   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  We did several tests to help evaluate that.  She is here for follow up of those tests.  She had vision loss in the right eye last summer that resolved.  She developed loss of vision again this past January and it has come and gone since then.  She currently has loss of vision.  She has a history of amaurosis fugax years ago and had imaging and saw neurology at that time.    Echo:  Normal EF, normal wall motion, mild AR,   US Carotids:  Heterogeneous plaque, bilaterally, 1-39% bilateral ICA stenosis. Normal subclavian arteries, bilaterally.  Occluded right vertebral artery. Patent and antegrade left vertebral artery.  An MRI/MRA was ordered but she did not want to have it done because of concern with side effects from the contrast.  She also cancelled her neurology appointment.    Hypertension: She is taking her medication daily.   She has been taking aspirin 325 mg but does not take it every day - she holds it if she takes the Excedrin.  She has been on simvastatin in the past and does not want to go on another statin.   Medications and allergies reviewed with patient and updated if appropriate.  Patient Active Problem List   Diagnosis Date Noted  . 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 Cough 06/14/2014  . Cyst of buttocks 10/06/2012  . RHINITIS 07/21/2009  . NONSPECIFIC ABNORMAL ELECTROCARDIOGRAM 07/19/2008  . Hyperlipemia 12/13/2006  . AMAUROSIS FUGAX 11/30/2006  . Essential hypertension 11/30/2006  . Irritable bowel syndrome 11/30/2006  . OVARIAN CYST 11/30/2006  . Rosacea 11/30/2006  . NONSPEC ELEVATION OF LEVELS OF TRANSAMINASE/LDH 11/30/2006    Current Outpatient Prescriptions on File Prior to Visit  Medication Sig Dispense Refill  . AMBULATORY NON FORMULARY MEDICATION Medication Name:  CICIOPIROX topical solution - anti fungal    . AMBULATORY NON FORMULARY MEDICATION Apply topically as needed. Hydrocortisone 1% - Iodoquirol 1%    . aspirin-acetaminophen-caffeine (EXCEDRIN MIGRAINE) 250-250-65 MG tablet Take 1 tablet by mouth daily.    . Clindamycin-Benzoyl Per, Refr, (DUAC) gel Apply topically 2 (two) times daily.    . DiphenhydrAMINE HCl (BENADRYL ALLERGY PO) Take by mouth as needed.     . fluticasone (FLONASE) 50 MCG/ACT nasal spray INSTILL 1 SPRAY INTO EACH NOSTRIL TWICE DAILY IF NEEDED FOR RHINITIS 16 g 5  . levocetirizine (XYZAL) 5 MG tablet Take 1 tablet (5 mg total) by mouth every evening. 30 tablet 5  . metoprolol succinate (TOPROL-XL) 50 MG 24 hr tablet take 1 tablet by mouth once daily 90 tablet 1  . mometasone (ELOCON) 0.1 % cream as needed.    . Multiple Vitamins-Calcium (ONE-A-DAY WOMENS PO) Take 1 each by mouth daily.      Marland Kitchen OVER THE COUNTER MEDICATION 325 mg daily.    . ramipril (ALTACE) 5 MG capsule Take 1 capsule (5 mg total) by mouth 2 (two) times daily. 180 capsule 1   No current facility-administered medications on file prior to visit.    Past Medical History  Diagnosis Date  . Other and unspecified hyperlipidemia     [place] study LDL goal=<130; NMR LDL  goal = < 160, ideally < 130  . Nonspecific elevation of levels of transaminase or lactic acid dehydrogenase (  LDH)   . Polydipsia   . Other and unspecified ovarian cyst     PMHx  . Amaurosis fugax     x 3 ; resolved with BP meds  . Irritable bowel syndrome     Dr Earlean Shawl, GI  . Rosacea   . Unspecified essential hypertension   . Colitis, ischemic ([place]) 2010    Dr. Earlean Shawl  . Sebaceous cyst      left buttock  . Milk intolerance ([place])     Past Surgical History  Procedure Laterality Date  . Cesarean section      x 2; G2 P2  . Tonsillectomy and adenoidectomy    . Peri ovarian cyst right resected  1980  . Appendectomy  1980  . Colonoscopy  2003, 2008    due 2018; Dr Earlean Shawl  . Upper  gastrointestinal endoscopy  1986    Social History   Social History  . Marital Status: Divorced    Spouse Name: N/A  . Number of Children: N/A  . Years of Education: N/A   Occupational History  . Teacher    Social History Main Topics  . Smoking status: Former Smoker    Quit date: 01/04/1969  . Smokeless tobacco: Never Used     Comment: smoked [place], up to 2 ppd  . Alcohol Use: No     Comment:  very rarely, < 1 glass of wine/ month  . Drug Use: No  . Sexual Activity: Not on file   Other Topics Concern  . 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 Diabetes Sister   . Diabetes Father   . Dementia Father   . Heart attack Father 46  . Heart disease Father   . Diabetes Maternal Grandfather   . Heart attack Maternal Aunt      X 2;ages 56  & 65  . Lung cancer Maternal Aunt     smoker  . Stroke Paternal Grandfather   . Dementia Paternal Grandfather   . Heart failure Maternal Aunt 62  . Basal cell carcinoma Mother   . Hypertension Mother 3    Angioplasty  . Lung cancer Mother     Small cell  . Heart attack Mother 50  . Cancer Mother     lung and skin (basal cell carcinoma)  . Obesity Sister     x 2  . Thyroid nodules Sister   . Prostate cancer      nephew  . Other      PTH nodule, nephew  . Diabetes Paternal Grandmother     Review of Systems     Objective:   Filed Vitals:   03/31/15 1126  BP: 128/88  Pulse: 77  Temp: 98.4 F (36.9 C)  Resp: 16   Filed Weights   03/31/15 1126  Weight: 173 lb (78.472 kg)   Body mass index is 33.79 kg/(m^2).   Physical Exam        Assessment & Plan:   She came in today to review her recent studies and to discuss what she should do next.   We reviewed both the carotid ultrasound and echo in detail.  We discussed medical treatment.  We discussed the benefits vs risk of the MRI/ MRA of the  brain and neck.  We discussed neurology evaluation.  She  agrees to see neuro - would like to see the neurologist she saw years ago if still in practice - she will call him.  She will let me know if she needs a referral. She will also discuss with her brother who is a Publishing rights manager.  Will hold off on MRI/MRA until she sees neuro - she will discuss with them .  If having this done will not change treatment she would prefer not to have it done.   20 minutes were spent face-to-face with the patient, over 50% of which was spent counseling regarding the above  -- testing and treatment for the right eye vision loss and occluded right vertebral artery.

## 2015-03-31 NOTE — Patient Instructions (Signed)
  Test(s) ordered today. Your results will be released to MyChart (or called to you) after review, usually within 72hours after test completion. If any changes need to be made, you will be notified at that same time.    Medications reviewed and updated.  No changes recommended at this time.     

## 2015-04-01 ENCOUNTER — Other Ambulatory Visit (INDEPENDENT_AMBULATORY_CARE_PROVIDER_SITE_OTHER): Payer: Medicare Other

## 2015-04-01 DIAGNOSIS — Z139 Encounter for screening, unspecified: Secondary | ICD-10-CM

## 2015-04-01 LAB — LIPID PANEL
CHOL/HDL RATIO: 3
Cholesterol: 210 mg/dL — ABNORMAL HIGH (ref 0–200)
HDL: 62.4 mg/dL (ref >39.00)
LDL Cholesterol: 128 mg/dL — ABNORMAL HIGH (ref 0–99)
NONHDL: 147.66
Triglycerides: 100 mg/dL (ref 0.0–149.0)
VLDL: 20 mg/dL (ref 0.0–40.0)

## 2015-04-01 LAB — CBC WITH DIFFERENTIAL/PLATELET
BASOS ABS: 0 10*3/uL (ref 0.0–0.1)
Basophils Relative: 0.8 % (ref 0.0–3.0)
Eosinophils Absolute: 0.2 10*3/uL (ref 0.0–0.7)
Eosinophils Relative: 3.4 % (ref 0.0–5.0)
HEMATOCRIT: 39.4 % (ref 36.0–46.0)
Hemoglobin: 13.6 g/dL (ref 12.0–15.0)
LYMPHS PCT: 29.2 % (ref 12.0–46.0)
Lymphs Abs: 1.6 10*3/uL (ref 0.7–4.0)
MCHC: 34.6 g/dL (ref 30.0–36.0)
MCV: 88.9 fl (ref 78.0–100.0)
MONOS PCT: 8.6 % (ref 3.0–12.0)
Monocytes Absolute: 0.5 10*3/uL (ref 0.1–1.0)
NEUTROS ABS: 3.2 10*3/uL (ref 1.4–7.7)
Neutrophils Relative %: 58 % (ref 43.0–77.0)
Platelets: 253 10*3/uL (ref 150.0–400.0)
RBC: 4.43 Mil/uL (ref 3.87–5.11)
RDW: 12.8 % (ref 11.5–15.5)
WBC: 5.4 10*3/uL (ref 4.0–10.5)

## 2015-04-01 LAB — COMPREHENSIVE METABOLIC PANEL
ALBUMIN: 4.1 g/dL (ref 3.5–5.2)
ALT: 19 U/L (ref 0–35)
AST: 19 U/L (ref 0–37)
Alkaline Phosphatase: 80 U/L (ref 39–117)
BILIRUBIN TOTAL: 0.4 mg/dL (ref 0.2–1.2)
BUN: 12 mg/dL (ref 6–23)
CALCIUM: 9.3 mg/dL (ref 8.4–10.5)
CO2: 29 meq/L (ref 19–32)
CREATININE: 0.81 mg/dL (ref 0.40–1.20)
Chloride: 106 mEq/L (ref 96–112)
GFR: 74.87 mL/min (ref >60.00)
GLUCOSE: 111 mg/dL — AB (ref 70–99)
Potassium: 4.3 mEq/L (ref 3.5–5.1)
Sodium: 141 mEq/L (ref 135–145)
TOTAL PROTEIN: 7.1 g/dL (ref 6.0–8.3)

## 2015-04-01 LAB — TSH: TSH: 4.36 u[IU]/mL (ref 0.35–4.50)

## 2015-04-01 LAB — HEMOGLOBIN A1C: HEMOGLOBIN A1C: 5.7 % (ref 4.6–6.5)

## 2015-04-03 LAB — VITAMIN D 1,25 DIHYDROXY
VITAMIN D3 1, 25 (OH): 29 pg/mL
Vitamin D 1, 25 (OH)2 Total: 29 pg/mL (ref 18–72)

## 2015-04-06 ENCOUNTER — Encounter: Payer: Self-pay | Admitting: Internal Medicine

## 2015-04-08 ENCOUNTER — Ambulatory Visit (INDEPENDENT_AMBULATORY_CARE_PROVIDER_SITE_OTHER): Payer: Medicare Other

## 2015-04-08 ENCOUNTER — Telehealth: Payer: Self-pay

## 2015-04-08 VITALS — BP 124/84 | Ht 60.0 in | Wt 174.5 lb

## 2015-04-08 DIAGNOSIS — Z0000 Encounter for general adult medical examination without abnormal findings: Secondary | ICD-10-CM

## 2015-04-08 NOTE — Patient Instructions (Addendum)
Ms. Dottavio , Thank you for taking time to come for your Medicare Wellness Visit. I appreciate your ongoing commitment to your health goals. Please review the following plan we discussed and let me know if I can assist you in the future.   Needs mammogram at some point I will check on dexa result;  Postponing colonoscopy for now   Will fup on Vit D level   These are the goals we discussed: Goals    . patient     Will continue with financial hobby        Shingles; Important Safety Information ZOSTAVAX does not protect everyone, so some people who get the vaccine may still get Shingles.  You should not get ZOSTAVAX if you are allergic to any of its ingredients, including gelatin or neomycin, have a weakened immune system, take high doses of steroids, or are pregnant or plan to become pregnant. You should not get ZOSTAVAX to prevent chickenpox.  Talk to your health care professional if you plan to get ZOSTAVAX at the same time as PNEUMOVAX23 (Pneumococcal Vaccine Polyvalent) because it may be better to get these vaccines at least 4 weeks apart.  Possible side effects include redness, pain, itching, swelling, hard lump, warmth, or bruising at the injection site, as well as headache.  ZOSTAVAX contains a weakened chickenpox virus. Tell your health care professional if you will be in close contact with newborn infants, someone who may be pregnant and has not had chickenpox or been vaccinated against chickenpox, or someone who has problems with their immune system. Your health care professional can tell you what situations you may need to avoid. You are encouraged to report negative side effects of prescription drugs to the FDA. Visit [employer], or call 1-800-FDA-1088.  Mammogram is due    This is a list of the screening recommended for you and due dates:  Health Maintenance  Topic Date Due  . Shingles Vaccine  11/08/2007  . Colon Cancer Screening  10/05/2011  . DEXA scan  (bone density measurement)  11/07/2012  . Pneumonia vaccines (1 of 2 - PCV13) 11/07/2012  . Mammogram  07/13/2013  . Flu Shot  08/05/2015  . Tetanus Vaccine  08/23/2021  .  Hepatitis C: One time screening is recommended by Center for Disease Control  (CDC) for  adults born from [birth year] through 1963.   Completed     Fall Prevention in the Home  Falls can cause injuries. They can happen to people of all ages. There are many things you can do to make your home safe and to help prevent falls.  WHAT CAN I DO ON THE OUTSIDE OF MY HOME?  Regularly fix the edges of walkways and driveways and fix any cracks.  Remove anything that might make you trip as you walk through a door, such as a raised step or threshold.  Trim any bushes or trees on the path to your home.  Use bright outdoor lighting.  Clear any walking paths of anything that might make someone trip, such as rocks or tools.  Regularly check to see if handrails are loose or broken. Make sure that both sides of any steps have handrails.  Any raised decks and porches should have guardrails on the edges.  Have any leaves, snow, or ice cleared regularly.  Use sand or salt on walking paths during winter.  Clean up any spills in your garage right away. This includes oil or grease spills. WHAT CAN I DO IN THE BATHROOM?  Use night lights.  Install grab bars by the toilet and in the tub and shower. Do not use towel bars as grab bars.  Use non-skid mats or decals in the tub or shower.  If you need to sit down in the shower, use a plastic, non-slip stool.  Keep the floor dry. Clean up any water that spills on the floor as soon as it happens.  Remove soap buildup in the tub or shower regularly.  Attach bath mats securely with double-sided non-slip rug tape.  Do not have throw rugs and other things on the floor that can make you trip. WHAT CAN I DO IN THE BEDROOM?  Use night lights.  Make sure that you have a light by your bed  that is easy to reach.  Do not use any sheets or blankets that are too big for your bed. They should not hang down onto the floor.  Have a firm chair that has side arms. You can use this for support while you get dressed.  Do not have throw rugs and other things on the floor that can make you trip. WHAT CAN I DO IN THE KITCHEN?  Clean up any spills right away.  Avoid walking on wet floors.  Keep items that you use a lot in easy-to-reach places.  If you need to reach something above you, use a strong step stool that has a grab bar.  Keep electrical cords out of the way.  Do not use floor polish or wax that makes floors slippery. If you must use wax, use non-skid floor wax.  Do not have throw rugs and other things on the floor that can make you trip. WHAT CAN I DO WITH MY STAIRS?  Do not leave any items on the stairs.  Make sure that there are handrails on both sides of the stairs and use them. Fix handrails that are broken or loose. Make sure that handrails are as long as the stairways.  Check any carpeting to make sure that it is firmly attached to the stairs. Fix any carpet that is loose or worn.  Avoid having throw rugs at the top or bottom of the stairs. If you do have throw rugs, attach them to the floor with carpet tape.  Make sure that you have a light switch at the top of the stairs and the bottom of the stairs. If you do not have them, ask someone to add them for you. WHAT ELSE CAN I DO TO HELP PREVENT FALLS?  Wear shoes that:  Do not have high heels.  Have rubber bottoms.  Are comfortable and fit you well.  Are closed at the toe. Do not wear sandals.  If you use a stepladder:  Make sure that it is fully opened. Do not climb a closed stepladder.  Make sure that both sides of the stepladder are locked into place.  Ask someone to hold it for you, if possible.  Clearly mark and make sure that you can see:  Any grab bars or handrails.  First and last  steps.  Where the edge of each step is.  Use tools that help you move around (mobility aids) if they are needed. These include:  Canes.  Walkers.  Scooters.  Crutches.  Turn on the lights when you go into a dark area. Replace any light bulbs as soon as they burn out.  Set up your furniture so you have a clear path. Avoid moving your furniture around.  If any  of your floors are uneven, fix them.  If there are any pets around you, be aware of where they are.  Review your medicines with your doctor. Some medicines can make you feel dizzy. This can increase your chance of falling. Ask your doctor what other things that you can do to help prevent falls.   This information is not intended to replace advice given to you by your health care provider. Make sure you discuss any questions you have with your health care provider.   Document Released: 10/17/2008 Document Revised: 05/07/2014 Document Reviewed: 01/25/2014 Elsevier Interactive Patient Education 2016 [place] Maintenance, Female Adopting a healthy lifestyle and getting preventive care can go a long way to promote health and wellness. Talk with your health care provider about what schedule of regular examinations is right for you. This is a good chance for you to check in with your provider about disease prevention and staying healthy. In between checkups, there are plenty of things you can do on your own. Experts have done a lot of research about which lifestyle changes and preventive measures are most likely to keep you healthy. Ask your health care provider for more information. WEIGHT AND DIET  Eat a healthy diet  Be sure to include plenty of vegetables, fruits, low-fat dairy products, and lean protein.  Do not eat a lot of foods high in solid fats, added sugars, or salt.  Get regular exercise. This is one of the most important things you can do for your health.  Most adults should exercise for at least 150  minutes each week. The exercise should increase your heart rate and make you sweat (moderate-intensity exercise).  Most adults should also do strengthening exercises at least twice a week. This is in addition to the moderate-intensity exercise.  Maintain a healthy weight  Body mass index (BMI) is a measurement that can be used to identify possible weight problems. It estimates body fat based on height and weight. Your health care provider can help determine your BMI and help you achieve or maintain a healthy weight.  For females 39 years of age and older:   A BMI below 18.5 is considered underweight.  A BMI of 18.5 to 24.9 is normal.  A BMI of 25 to 29.9 is considered overweight.  A BMI of 30 and above is considered obese.  Watch levels of cholesterol and blood lipids  You should start having your blood tested for lipids and cholesterol at 68 years of age, then have this test every 5 years.  You may need to have your cholesterol levels checked more often if:  Your lipid or cholesterol levels are high.  You are older than 68 years of age.  You are at high risk for heart disease.  CANCER SCREENING   Lung Cancer  Lung cancer screening is recommended for adults 88-19 years old who are at high risk for lung cancer because of a history of smoking.  A yearly low-dose CT scan of the lungs is recommended for people who:  Currently smoke.  Have quit within the past 15 years.  Have at least a 30-pack-year history of smoking. A pack year is smoking an average of one pack of cigarettes a day for 1 year.  Yearly screening should continue until it has been 15 years since you quit.  Yearly screening should stop if you develop a health problem that would prevent you from having lung cancer treatment.  Breast Cancer  Practice breast self-awareness.  This means understanding how your breasts normally appear and feel.  It also means doing regular breast self-exams. Let your health  care provider know about any changes, no matter how small.  If you are in your 20s or 30s, you should have a clinical breast exam (CBE) by a health care provider every 1-3 years as part of a regular health exam.  If you are 48 or older, have a CBE every year. Also consider having a breast X-ray (mammogram) every year.  If you have a family history of breast cancer, talk to your health care provider about genetic screening.  If you are at high risk for breast cancer, talk to your health care provider about having an MRI and a mammogram every year.  Breast cancer gene (BRCA) assessment is recommended for women who have family members with BRCA-related cancers. BRCA-related cancers include:  Breast.  Ovarian.  Tubal.  Peritoneal cancers.  Results of the assessment will determine the need for genetic counseling and BRCA1 and BRCA2 testing. Cervical Cancer Your health care provider may recommend that you be screened regularly for cancer of the pelvic organs (ovaries, uterus, and vagina). This screening involves a pelvic examination, including checking for microscopic changes to the surface of your cervix (Pap test). You may be encouraged to have this screening done every 3 years, beginning at age 70.  For women ages 107-65, health care providers may recommend pelvic exams and Pap testing every 3 years, or they may recommend the Pap and pelvic exam, combined with testing for human papilloma virus (HPV), every 5 years. Some types of HPV increase your risk of cervical cancer. Testing for HPV may also be done on women of any age with unclear Pap test results.  Other health care providers may not recommend any screening for nonpregnant women who are considered low risk for pelvic cancer and who do not have symptoms. Ask your health care provider if a screening pelvic exam is right for you.  If you have had past treatment for cervical cancer or a condition that could lead to cancer, you need Pap  tests and screening for cancer for at least 20 years after your treatment. If Pap tests have been discontinued, your risk factors (such as having a new sexual partner) need to be reassessed to determine if screening should resume. Some women have medical problems that increase the chance of getting cervical cancer. In these cases, your health care provider may recommend more frequent screening and Pap tests. Colorectal Cancer  This type of cancer can be detected and often prevented.  Routine colorectal cancer screening usually begins at 68 years of age and continues through 68 years of age.  Your health care provider may recommend screening at an earlier age if you have risk factors for colon cancer.  Your health care provider may also recommend using home test kits to check for hidden blood in the stool.  A small camera at the end of a tube can be used to examine your colon directly (sigmoidoscopy or colonoscopy). This is done to check for the earliest forms of colorectal cancer.  Routine screening usually begins at age 44.  Direct examination of the colon should be repeated every 5-10 years through 68 years of age. However, you may need to be screened more often if early forms of precancerous polyps or small growths are found. Skin Cancer  Check your skin from head to toe regularly.  Tell your health care provider about any new moles or  changes in moles, especially if there is a change in a mole's shape or color.  Also tell your health care provider if you have a mole that is larger than the size of a pencil eraser.  Always use sunscreen. Apply sunscreen liberally and repeatedly throughout the day.  Protect yourself by wearing long sleeves, pants, a wide-brimmed hat, and sunglasses whenever you are outside. HEART DISEASE, DIABETES, AND HIGH BLOOD PRESSURE   High blood pressure causes heart disease and increases the risk of stroke. High blood pressure is more likely to develop  in:  People who have blood pressure in the high end of the normal range (130-139/85-89 mm Hg).  People who are overweight or obese.  People who are African American.  If you are 70-35 years of age, have your blood pressure checked every 3-5 years. If you are 61 years of age or older, have your blood pressure checked every year. You should have your blood pressure measured twice--once when you are at a hospital or clinic, and once when you are not at a hospital or clinic. Record the average of the two measurements. To check your blood pressure when you are not at a hospital or clinic, you can use:  An automated blood pressure machine at a pharmacy.  A home blood pressure monitor.  If you are between 40 years and 60 years old, ask your health care provider if you should take aspirin to prevent strokes.  Have regular diabetes screenings. This involves taking a blood sample to check your fasting blood sugar level.  If you are at a normal weight and have a low risk for diabetes, have this test once every three years after 68 years of age.  If you are overweight and have a high risk for diabetes, consider being tested at a younger age or more often. PREVENTING INFECTION  Hepatitis B  If you have a higher risk for hepatitis B, you should be screened for this virus. You are considered at high risk for hepatitis B if:  You were born in a country where hepatitis B is common. Ask your health care provider which countries are considered high risk.  Your parents were born in a high-risk country, and you have not been immunized against hepatitis B (hepatitis B vaccine).  You have HIV or AIDS.  You use needles to inject street drugs.  You live with someone who has hepatitis B.  You have had sex with someone who has hepatitis B.  You get hemodialysis treatment.  You take certain medicines for conditions, including cancer, organ transplantation, and autoimmune conditions. Hepatitis C  Blood  testing is recommended for:  Everyone born from [birth year] through 1963.  Anyone with known risk factors for hepatitis C. Sexually transmitted infections (STIs)  You should be screened for sexually transmitted infections (STIs) including gonorrhea and chlamydia if:  You are sexually active and are younger than 68 years of age.  You are older than 68 years of age and your health care provider tells you that you are at risk for this type of infection.  Your sexual activity has changed since you were last screened and you are at an increased risk for chlamydia or gonorrhea. Ask your health care provider if you are at risk.  If you do not have HIV, but are at risk, it may be recommended that you take a prescription medicine daily to prevent HIV infection. This is called pre-exposure prophylaxis (PrEP). You are considered at risk if:  You  are sexually active and do not regularly use condoms or know the HIV status of your partner(s).  You take drugs by injection.  You are sexually active with a partner who has HIV. Talk with your health care provider about whether you are at high risk of being infected with HIV. If you choose to begin PrEP, you should first be tested for HIV. You should then be tested every 3 months for as long as you are taking PrEP.  PREGNANCY   If you are premenopausal and you may become pregnant, ask your health care provider about preconception counseling.  If you may become pregnant, take 400 to 800 micrograms (mcg) of folic acid every day.  If you want to prevent pregnancy, talk to your health care provider about birth control (contraception). OSTEOPOROSIS AND MENOPAUSE   Osteoporosis is a disease in which the bones lose minerals and strength with aging. This can result in serious bone fractures. Your risk for osteoporosis can be identified using a bone density scan.  If you are 23 years of age or older, or if you are at risk for osteoporosis and fractures, ask your  health care provider if you should be screened.  Ask your health care provider whether you should take a calcium or vitamin D supplement to lower your risk for osteoporosis.  Menopause may have certain physical symptoms and risks.  Hormone replacement therapy may reduce some of these symptoms and risks. Talk to your health care provider about whether hormone replacement therapy is right for you.  HOME CARE INSTRUCTIONS   Schedule regular health, dental, and eye exams.  Stay current with your immunizations.   Do not use any tobacco products including cigarettes, chewing tobacco, or electronic cigarettes.  If you are pregnant, do not drink alcohol.  If you are breastfeeding, limit how much and how often you drink alcohol.  Limit alcohol intake to no more than 1 drink per day for nonpregnant women. One drink equals 12 ounces of beer, 5 ounces of wine, or 1 ounces of hard liquor.  Do not use street drugs.  Do not share needles.  Ask your health care provider for help if you need support or information about quitting drugs.  Tell your health care provider if you often feel depressed.  Tell your health care provider if you have ever been abused or do not feel safe at home.   This information is not intended to replace advice given to you by your health care provider. Make sure you discuss any questions you have with your health care provider.   Document Released: 07/06/2010 Document Revised: 01/11/2014 Document Reviewed: 11/22/2012 Elsevier Interactive Patient Education [employer].

## 2015-04-08 NOTE — Progress Notes (Addendum)
Subjective:   Kimberly Stephenson is a 70 y.o. female who presents for an Initial Medicare Annual Wellness Visit.  Review of Systems     HRA assessment completed during visit; [place]  The Patient was informed that this wellness visit is to identify risk and educate on how to reduce risk for increase disease through lifestyle changes.   HRA assessment completed during visit;   The Patient was informed that this wellness visit is to identify risk and educate on how to reduce risk for increase disease through lifestyle changes.   Retired Pharmacist, hospital;  Has a dtr who is OBGYN; Maternal fetal critical care;  Younger dtr; psychologist  Brother is Hydrographic surveyor;  Good feedback in family    ROS deferred to CPE exam with physician  Medical and family hx Sister; DM Father DM, Dementia; MI Grandmother DM Aunt; MI and Lung ca Mother had basal cell carcinoma; HTN; lung cancer  Tobacco; quit 1971 ETOH: Rarely  Medical issues  Hyperlipidemia 03/2015; chol 210; Trig 100; HDL 62; LDL 128; ratio 3 Hx diabetes in the family; A1c 5.7 and glucose 111  Discussed the recent stress regarding medical issues impacted her diet;  Amaurosis fugax x 15 years ago/ currently in process of being follow up with neuro for eye problem/  States currently doctors feel she had a clot in right eye; pulsing between 9 and 12:00 position; she sees a bright circle of light: narrowing of blood vessel; OD/Declines MRI at present due to dye;   sees Dr. Carolynn Sayers in June and July ; for [facility] of vision test and fup  Dr. Carolynn Sayers; carotid artery; echo; MRI; ? Block in vertebral; Stopped Excedrin and now takes full ASA;  HTN on meds   BMI: 34  Diet; eats 3 meals a day More calcium (cottage cheese or yogurt)  Yogurt and fruit;  Lunch; have lean meat and fruit;  slice of bread; does not eat sandwiches Does not eat nitrates Dinner 2 vegetables a meat and 2 vegetables; Chicken or pork;  Loves to bake; made a carrot cake but for  other people  Current Stress due to health but these issues have started to resolve  Exercise; Mbr [facility] [employer]; swims in  Pool 60 minutes 3 times a week; Was swimmer and loved gymnastics in college. Treadmill; walks x 60 minutes qod;  Loves to walk in the yard;   Start date; plan to start her exercise routine today  SAFETY; living alone / 2 story;  Holds on to railing going downstairs to avoid fall risk Consider; downsizing; Both dtr would like for her to move in with their respective Family in [place] but she also has family in [place]  Does not plan to move at present Does have some help with yard in the fall  Removal of clutter clearing paths through the home,  Does not climb ladders;  Warehouse manager; YES Smoke detectors yes Firearms safety / no firearms;  Driving accidents and seatbelt/ no  Sun protection/ maybe in the summer- yes  Stressors health issue was the major stress but getting better; eye issues being followed  closely  Coping skills; 50% there with retirement; still has urge to go back to work;  [employer];  [place] active and engaged   Medication review/ States there are no medications issues; she is not taking Excedrin but is taking Asa 325mg  per MD   Fall assessment / no   Mobilization and Functional losses in  the last year./ no Sleep patterns/ sometimes nasal congestion;  Allergies   Urinary or fecal incontinence reviewed/ not at the present time  Counseling: Colonoscopy; 10/2001/has hx of bloody diarrhea; DUE but postponing for now;  Spoke to Dr. Earlean Shawl; asymptomatic for now; colo-guard was discussed  If has  another bout of bloody diarrhea; may consider Hx of Pseudomembranous colitis; vanco tx in the past; no new reoccurences  EKG: 08/2012  Mammogram 07/2011 neg for malignancy/ DUE; will consider having this done;  Dexa: had this done and it's normal; Due for screen and will consider;  PAP 2004 Hearing: adequate; no  issues  Ophthalmology exam; q 6 months or sooner if needed.  Deferred eye exam due to vision being followed;   Immunizations/ shingles; doesn't think she will have this / part D benefit Will review more information prior to considering; educated per the CDC   PCV 13; will review CDC.gov for more information. Had bacterial pneumonia as a child at 63   Advanced Directive; has been completed Health advice or referrals  Fup mammogram; will consider at the breast center fup on Dexa;   Current Care Team reviewed and updated Cardiac Risk Factors include: advanced age (>83men, >36 women);dyslipidemia;family history of premature cardiovascular disease;hypertension   discussed mammogram;  Also discussed Dexa; will try to find old report  Will fup on Vit d  Deferring colonoscopy at present    Objective:    Today's Vitals   04/08/15 1119  BP: 124/84  Height: 5' (1.524 m)  Weight: 174 lb 8 oz (79.153 kg)   Body mass index is 34.08 kg/(m^2).   Current Medications (verified) Outpatient Encounter Prescriptions as of 04/08/2015  Medication Sig  . AMBULATORY NON FORMULARY MEDICATION Medication Name: CICIOPIROX topical solution - anti fungal  . AMBULATORY NON FORMULARY MEDICATION Apply topically as needed. Hydrocortisone 1% - Iodoquirol 1%  . aspirin EC 325 MG tablet Take 1 tablet (325 mg total) by mouth daily.  Marland Kitchen aspirin-acetaminophen-caffeine (EXCEDRIN MIGRAINE) 250-250-65 MG tablet Take 1 tablet by mouth daily. Reported on 04/08/2015  . Clindamycin-Benzoyl Per, Refr, (DUAC) gel Apply topically 2 (two) times daily.  . DiphenhydrAMINE HCl (BENADRYL ALLERGY PO) Take by mouth as needed.   . fluticasone (FLONASE) 50 MCG/ACT nasal spray INSTILL 1 SPRAY INTO EACH NOSTRIL TWICE DAILY IF NEEDED FOR RHINITIS  . levocetirizine (XYZAL) 5 MG tablet Take 1 tablet (5 mg total) by mouth every evening.  . metoprolol succinate (TOPROL-XL) 50 MG 24 hr tablet take 1 tablet by mouth once daily  . mometasone  (ELOCON) 0.1 % cream as needed.  . Multiple Vitamins-Calcium (ONE-A-DAY WOMENS PO) Take 1 each by mouth daily.    Marland Kitchen OVER THE COUNTER MEDICATION 325 mg daily.  . ramipril (ALTACE) 5 MG capsule Take 1 capsule (5 mg total) by mouth 2 (two) times daily.   No facility-administered encounter medications on file as of 04/08/2015.    Allergies (verified) Cephalosporins; Lamisil; Sulfonamide derivatives; Amoxicillin-pot clavulanate; Simvastatin; Ezetimibe; and Ramipril   History: Past Medical History  Diagnosis Date  . Other and unspecified hyperlipidemia     [place] study LDL goal=<130; NMR LDL  goal = < 160, ideally < 130  . Nonspecific elevation of levels of transaminase or lactic acid dehydrogenase (LDH)   . Polydipsia   . Other and unspecified ovarian cyst     PMHx  . Amaurosis fugax     x 3 ; resolved with BP meds  . Irritable bowel syndrome     Dr Earlean Shawl,  GI  . Rosacea   . Unspecified essential hypertension   . Colitis, ischemic ([place]) 2010    Dr. Earlean Shawl  . Sebaceous cyst      left buttock  . Milk intolerance ([place])    Past Surgical History  Procedure Laterality Date  . Cesarean section      x 2; G2 P2  . Tonsillectomy and adenoidectomy    . Peri ovarian cyst right resected  1980  . Appendectomy  1980  . Colonoscopy  2003, 2008    due 2018; Dr Earlean Shawl  . Upper gastrointestinal endoscopy  1986   Family History  Problem Relation Age of Onset  . Diabetes Sister   . Diabetes Father   . Dementia Father   . Heart attack Father 2  . Heart disease Father   . Diabetes Maternal Grandfather   . Heart attack Maternal Aunt      X 2;ages 56  & 65  . Lung cancer Maternal Aunt     smoker  . Stroke Paternal Grandfather   . Dementia Paternal Grandfather   . Heart failure Maternal Aunt 62  . Basal cell carcinoma Mother   . Hypertension Mother 62    Angioplasty  . Lung cancer Mother     Small cell  . Heart attack Mother 66  . Cancer Mother     lung and skin (basal cell  carcinoma)  . Obesity Sister     x 2  . Thyroid nodules Sister   . Prostate cancer      nephew  . Other      PTH nodule, nephew  . Diabetes Paternal Grandmother    Social History   Occupational History  . Teacher    Social History Main Topics  . Smoking status: Former Smoker    Quit date: 01/04/1969  . Smokeless tobacco: Never Used     Comment: smoked [place], up to 2 ppd  . Alcohol Use: No     Comment:  very rarely, < 1 glass of wine/ month  . Drug Use: No  . Sexual Activity: Not on file    Tobacco Counseling Counseling given: Not Answered   Activities of Daily Living In your present state of health, do you have any difficulty performing the following activities: 04/08/2015  Hearing? N  Vision? (No Data)  Difficulty concentrating or making decisions? N  Walking or climbing stairs? N  Dressing or bathing? N  Doing errands, shopping? N  Preparing Food and eating ? N  Using the Toilet? N  In the past six months, have you accidently leaked urine? N  Do you have problems with loss of bowel control? N  Managing your Medications? N  Managing your Finances? N  Housekeeping or managing your Housekeeping? N    Immunizations and Health Maintenance Immunization History  Administered Date(s) Administered  . Influenza Whole 10/04/2001  . Influenza, High Dose Seasonal PF 09/19/2013  . Influenza,inj,Quad PF,36+ Mos 09/13/2014  . Tdap 08/24/2011   Health Maintenance Due  Topic Date Due  . ZOSTAVAX  11/08/2007  . COLONOSCOPY  10/05/2011  . DEXA SCAN  11/07/2012  . PNA vac Low Risk Adult (1 of 2 - PCV13) 11/07/2012  . MAMMOGRAM  07/13/2013    Patient Care Team: Binnie Rail, MD as PCP - General (Internal Medicine)  Indicate any recent Medical Services you may have received from other than [facility] providers in the past year (date may be approximate).     Assessment:  This is a routine wellness examination for Kimberly Stephenson.   Hearing/Vision screen  Hearing Screening    125Hz  250Hz  500Hz  1000Hz  2000Hz  4000Hz  8000Hz   Right ear:      100   Left ear:      100     Dietary issues and exercise activities discussed: Current Exercise Habits: Structured exercise class, Type of exercise: walking;Other - see comments (swimming 60 min tiw), Time (Minutes): 60, Frequency (Times/Week): 6, Weekly Exercise (Minutes/Week): 360, Intensity: Moderate  Goals    . patient     Will continue with financial hobby       Depression Screen PHQ 2/9 Scores 04/08/2015 03/04/2014 03/04/2014 08/21/2012  PHQ - 2 Score 0 0 1 0    Fall Risk Fall Risk  04/08/2015 03/04/2014 03/04/2014 03/04/2014 08/21/2012  Falls in the past year? No No No No No    Cognitive Function: MMSE - Mini Mental State Exam 04/08/2015  Not completed: (No Data)    Ad8 score 0   Screening Tests Health Maintenance  Topic Date Due  . ZOSTAVAX  11/08/2007  . COLONOSCOPY  10/05/2011  . DEXA SCAN  11/07/2012  . PNA vac Low Risk Adult (1 of 2 - PCV13) 11/07/2012  . MAMMOGRAM  07/13/2013  . INFLUENZA VACCINE  08/05/2015  . TETANUS/TDAP  08/23/2021  . Hepatitis C Screening  Completed      Plan:   Needs mammogram at some point  I will check on dexa result;  Postponing colonoscopy for now  Will fup on Vit D level; was 29;   During the course of the visit, Kimberly Stephenson was educated and counseled about the following appropriate screening and preventive services:   Vaccines to include Pneumoccal, Influenza, Hepatitis B, Td, Zostavax, HCV/ declines shingles and Prevnar 13; will read from the cdc site regarding the prevnar  Electrocardiogram - 08/2012  Cardiovascular disease screening - BP; exercises    Colorectal cancer screening/ postpone unless issues  Bone density screening/ ? Due but will try to locate prior exam   Diabetes screening/neg  Glaucoma screening / being followed for possible stroke in eye  Mammography/will consider  Nutrition counseling/ discussed; would like to lose a few pounds  Smoking  cessation counseling/n/a  Patient Instructions (the written plan) were given to the patient.    Vit D; Note to Dr. Quay Burow to review for treatment or advise on otc vit d    Wynetta Fines, RN   04/08/2015      Medical screening examination/treatment/procedure(s) were performed by non-physician practitioner and as supervising physician I was immediately available for consultation/collaboration. I agree with above. Binnie Rail, MD

## 2015-04-08 NOTE — Telephone Encounter (Signed)
AWV visit today. Vitamin D level low at 29; please call the patient and advise; Tks,

## 2015-04-09 NOTE — Telephone Encounter (Signed)
Have her start taking 1000 units of vitamin D daily in addition to what she is taking

## 2015-04-09 NOTE — Telephone Encounter (Signed)
Call to Kimberly Stephenson and she is taking 1000u Vit D in [place] Vit for women over 60 and told to add 1000u Vit D per Dr. Quay Burow.  No questions; will fup on Vit d level with Dr. Quay Burow at next fup

## 2015-04-11 ENCOUNTER — Telehealth: Payer: Self-pay

## 2015-04-11 NOTE — Telephone Encounter (Signed)
Review notes and placed call to xray and could not locate a dexa scan; Patient declined repeat dexa and stated she may consider but thought her first one was normal. Does not know date or location as she thought she had it at [facility].

## 2015-05-02 ENCOUNTER — Other Ambulatory Visit: Payer: Self-pay | Admitting: *Deleted

## 2015-05-02 MED ORDER — RAMIPRIL 5 MG PO CAPS: 5.0000 mg | ORAL_CAPSULE | Freq: Two times a day (BID) | ORAL | Status: DC

## 2015-05-02 MED ORDER — METOPROLOL SUCCINATE ER 50 MG PO TB24: ORAL_TABLET | ORAL | Status: DC

## 2015-05-02 NOTE — Telephone Encounter (Signed)
Received call pt states [facility] has been tryi g to get her metoprolol & ramipril refilled they stated they faxed two times this week. Inform pt per chart no electronic request has been submitted, but will send them rx on both med to fill today!...Johny Chess

## 2015-06-08 ENCOUNTER — Encounter: Payer: Self-pay | Admitting: Internal Medicine

## 2015-06-08 DIAGNOSIS — E559 Vitamin D deficiency, unspecified: Secondary | ICD-10-CM | POA: Insufficient documentation

## 2015-07-03 LAB — HM MAMMOGRAPHY

## 2015-08-20 ENCOUNTER — Other Ambulatory Visit: Payer: Self-pay | Admitting: Internal Medicine

## 2015-09-19 ENCOUNTER — Encounter: Payer: Self-pay | Admitting: Internal Medicine

## 2015-09-19 ENCOUNTER — Ambulatory Visit (INDEPENDENT_AMBULATORY_CARE_PROVIDER_SITE_OTHER): Payer: Medicare Other | Admitting: Internal Medicine

## 2015-09-19 VITALS — BP 128/84 | HR 100 | Temp 98.3°F | Resp 16 | Wt 167.0 lb

## 2015-09-19 DIAGNOSIS — Z23 Encounter for immunization: Secondary | ICD-10-CM | POA: Diagnosis not present

## 2015-09-19 DIAGNOSIS — I10 Essential (primary) hypertension: Secondary | ICD-10-CM | POA: Diagnosis not present

## 2015-09-19 MED ORDER — RAMIPRIL 2.5 MG PO CAPS: 2.5000 mg | ORAL_CAPSULE | Freq: Every day | ORAL | 3 refills | Status: DC

## 2015-09-19 NOTE — Progress Notes (Signed)
Subjective:    Patient ID: Kimberly Stephenson, female    DOB: 1945/08/08, 70 y.o.   MRN: [id number]  HPI The patient is here for follow up.  She has lost 7 lbs since she was here last.  She has been eating more healthy and has been eating every 3 hrs.  She has decreased her carbohydrates.  She has not started exerisiing yet.   Hypertension: She is taking her medication but has decreased the ramipril to 5 mg and stopped the metoprolol completely . She is compliant with a low sodium diet.  She has had a little SOB.  She also has headaches.  She denies chest pain, palpitations, edema. She is not exercising regularly.  She does monitor her blood pressure at the pharmacy - it has been 134/77, 111/73, 139/80 (highest), 122/69, 110/71.    She has had a groin pull in her right leg.  She decreased her activity and it has gotten better.    Medications and allergies reviewed with patient and updated if appropriate.  Patient Active Problem List   Diagnosis Date Noted  . Vitamin D deficiency 06/08/2015  . 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 Cough 06/14/2014  . Cyst of buttocks 10/06/2012  . RHINITIS 07/21/2009  . NONSPECIFIC ABNORMAL ELECTROCARDIOGRAM 07/19/2008  . Hyperlipemia 12/13/2006  . AMAUROSIS FUGAX 11/30/2006  . Essential hypertension 11/30/2006  . Irritable bowel syndrome 11/30/2006  . OVARIAN CYST 11/30/2006  . Rosacea 11/30/2006  . NONSPEC ELEVATION OF LEVELS OF TRANSAMINASE/LDH 11/30/2006    Current Outpatient Prescriptions on File Prior to Visit  Medication Sig Dispense Refill  . AMBULATORY NON FORMULARY MEDICATION Medication Name: CICIOPIROX topical solution - anti fungal    . AMBULATORY NON FORMULARY MEDICATION Apply topically as needed. Hydrocortisone 1% - Iodoquirol 1%    . aspirin EC 325 MG tablet Take 1 tablet (325 mg total) by mouth daily. 30 tablet 0  . aspirin-acetaminophen-caffeine (EXCEDRIN MIGRAINE) 250-250-65 MG tablet Take 1 tablet by  mouth daily. Reported on 04/08/2015    . Clindamycin-Benzoyl Per, Refr, (DUAC) gel Apply topically 2 (two) times daily.    . DiphenhydrAMINE HCl (BENADRYL ALLERGY PO) Take by mouth as needed.     . fluticasone (FLONASE) 50 MCG/ACT nasal spray Instill 1 spray into each nostril twice daily for rhinitis 16 g 5  . levocetirizine (XYZAL) 5 MG tablet Take 1 tablet (5 mg total) by mouth every evening. 30 tablet 5  . metoprolol succinate (TOPROL-XL) 50 MG 24 hr tablet take 1 tablet by mouth once daily 90 tablet 1  . mometasone (ELOCON) 0.1 % cream as needed.    . Multiple Vitamins-Calcium (ONE-A-DAY WOMENS PO) Take 1 each by mouth daily.      Marland Kitchen OVER THE COUNTER MEDICATION 325 mg daily.    . ramipril (ALTACE) 5 MG capsule Take 1 capsule (5 mg total) by mouth 2 (two) times daily. 180 capsule 1   No current facility-administered medications on file prior to visit.     Past Medical History:  Diagnosis Date  . Amaurosis fugax    x 3 ; resolved with BP meds  . Colitis, ischemic ([place]) 2010   Dr. Earlean Shawl  . Irritable bowel syndrome    Dr Earlean Shawl, GI  . Milk intolerance ([place])   . Nonspecific elevation of levels of transaminase or lactic acid dehydrogenase (LDH)   . Other and unspecified hyperlipidemia    [place] study LDL goal=<130; NMR LDL  goal = < 160,  ideally < 130  . Other and unspecified ovarian cyst    PMHx  . Polydipsia   . Rosacea   . Sebaceous cyst     left buttock  . Unspecified essential hypertension     Past Surgical History:  Procedure Laterality Date  . APPENDECTOMY  1980  . CESAREAN SECTION     x 2; G2 P2  . COLONOSCOPY  2003, 2008   due 2018; Dr Earlean Shawl  . Peri ovarian cyst right resected  1980  . TONSILLECTOMY AND ADENOIDECTOMY    . UPPER GASTROINTESTINAL ENDOSCOPY  1986    Social History   Social History  . Marital status: Divorced    Spouse name: N/A  . Number of children: N/A  . Years of education: N/A   Occupational History  . Teacher    Social History  Main Topics  . Smoking status: Former Smoker    Quit date: 01/04/1969  . Smokeless tobacco: Never Used     Comment: smoked [place], up to 2 ppd  . Alcohol use No     Comment:  very rarely, < 1 glass of wine/ month  . Drug use: No  . Sexual activity: Not on file   Other Topics Concern  . 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 Diabetes Sister   . Diabetes Father   . Dementia Father   . Heart attack Father 77  . Heart disease Father   . Diabetes Maternal Grandfather   . Heart attack Maternal Aunt      X 2;ages 56  & 65  . Lung cancer Maternal Aunt     smoker  . Stroke Paternal Grandfather   . Dementia Paternal Grandfather   . Heart failure Maternal Aunt 62  . Basal cell carcinoma Mother   . Hypertension Mother 70    Angioplasty  . Lung cancer Mother     Small cell  . Heart attack Mother 17  . Cancer Mother     lung and skin (basal cell carcinoma)  . Obesity Sister     x 2  . Thyroid nodules Sister   . Prostate cancer      nephew  . Other      PTH nodule, nephew  . Diabetes Paternal Grandmother     Review of Systems  Respiratory: Positive for shortness of breath.   Cardiovascular: Negative for chest pain, palpitations and leg swelling.  Neurological: Positive for headaches.       Objective:   Vitals:   09/19/15 1434  BP: 128/84  Pulse: 100  Resp: 16  Temp: 98.3 F (36.8 C)   Filed Weights   09/19/15 1434  Weight: 167 lb (75.8 kg)   Body mass index is 32.61 kg/m.   Physical Exam    Constitutional: Appears well-developed and well-nourished. No distress.  HENT:  Head: Normocephalic and atraumatic.  Cardiovascular: Normal rate, regular rhythm and normal heart sounds.   No murmur heard. Pulmonary/Chest: Effort normal and breath sounds normal. No respiratory distress. No has no wheezes. No rales.  Musculoskeletal: No edema.     Skin: Skin is warm and dry. Not diaphoretic.  Psychiatric: Normal mood and affect. Behavior is normal.     Assessment & Plan:    See Problem List for Assessment and Plan of chronic medical  problems.   F/u in 6 months

## 2015-09-19 NOTE — Patient Instructions (Addendum)
  Flu vaccine administered today.   Medications reviewed and updated.  Changes include decreasing the ramipril to 2.5 mg daily.   Monitor your BP and heart rate.  Your prescription(s) have been submitted to your pharmacy. Please take as directed and contact our office if you believe you are having problem(s) with the medication(s).   Please followup in 6 months for physical

## 2015-09-19 NOTE — Assessment & Plan Note (Addendum)
She lowered her medication BP has been well controlled with lower medication Her goal is to get off medication completely BP has been well controlled Will decrease ramipril to 2.5 mg daily She will monitor at home or pharmacy Follow up in 6 months, sooner if needed

## 2015-09-19 NOTE — Progress Notes (Signed)
Pre visit review using our clinic review tool, if applicable. No additional management support is needed unless otherwise documented below in the visit note. 

## 2016-03-23 ENCOUNTER — Ambulatory Visit (INDEPENDENT_AMBULATORY_CARE_PROVIDER_SITE_OTHER): Payer: Medicare Other | Admitting: Internal Medicine

## 2016-03-23 ENCOUNTER — Ambulatory Visit (INDEPENDENT_AMBULATORY_CARE_PROVIDER_SITE_OTHER)
Admission: RE | Admit: 2016-03-23 | Discharge: 2016-03-23 | Disposition: A | Discharge status: Home / Self Care | Payer: Medicare Other | Source: Ambulatory Visit | Attending: Internal Medicine | Admitting: Internal Medicine

## 2016-03-23 ENCOUNTER — Encounter: Payer: Self-pay | Admitting: Internal Medicine

## 2016-03-23 ENCOUNTER — Other Ambulatory Visit (INDEPENDENT_AMBULATORY_CARE_PROVIDER_SITE_OTHER): Payer: Medicare Other

## 2016-03-23 VITALS — BP 144/96 | HR 99 | Temp 97.7°F | Resp 16 | Ht 60.0 in | Wt 161.0 lb

## 2016-03-23 DIAGNOSIS — J329 Chronic sinusitis, unspecified: Secondary | ICD-10-CM

## 2016-03-23 DIAGNOSIS — I10 Essential (primary) hypertension: Secondary | ICD-10-CM | POA: Diagnosis not present

## 2016-03-23 DIAGNOSIS — R0609 Other forms of dyspnea: Secondary | ICD-10-CM

## 2016-03-23 DIAGNOSIS — R05 Cough: Secondary | ICD-10-CM | POA: Diagnosis not present

## 2016-03-23 DIAGNOSIS — Z0000 Encounter for general adult medical examination without abnormal findings: Secondary | ICD-10-CM | POA: Diagnosis not present

## 2016-03-23 DIAGNOSIS — R002 Palpitations: Secondary | ICD-10-CM

## 2016-03-23 DIAGNOSIS — M25551 Pain in right hip: Secondary | ICD-10-CM

## 2016-03-23 DIAGNOSIS — R059 Cough, unspecified: Secondary | ICD-10-CM

## 2016-03-23 DIAGNOSIS — E7800 Pure hypercholesterolemia, unspecified: Secondary | ICD-10-CM | POA: Diagnosis not present

## 2016-03-23 LAB — CBC WITH DIFFERENTIAL/PLATELET
BASOS PCT: 1 % (ref 0.0–3.0)
Basophils Absolute: 0.1 10*3/uL (ref 0.0–0.1)
Eosinophils Absolute: 0.1 10*3/uL (ref 0.0–0.7)
Eosinophils Relative: 2.3 % (ref 0.0–5.0)
HEMATOCRIT: 43.5 % (ref 36.0–46.0)
HEMOGLOBIN: 14.9 g/dL (ref 12.0–15.0)
Lymphocytes Relative: 28 % (ref 12.0–46.0)
Lymphs Abs: 1.5 10*3/uL (ref 0.7–4.0)
MCHC: 34.1 g/dL (ref 30.0–36.0)
MCV: 88.1 fl (ref 78.0–100.0)
MONO ABS: 0.5 10*3/uL (ref 0.1–1.0)
MONOS PCT: 8.6 % (ref 3.0–12.0)
Neutro Abs: 3.2 10*3/uL (ref 1.4–7.7)
Neutrophils Relative %: 60.1 % (ref 43.0–77.0)
Platelets: 253 10*3/uL (ref 150.0–400.0)
RBC: 4.94 Mil/uL (ref 3.87–5.11)
RDW: 12.8 % (ref 11.5–15.5)
WBC: 5.3 10*3/uL (ref 4.0–10.5)

## 2016-03-23 LAB — LIPID PANEL
Cholesterol: 232 mg/dL — ABNORMAL HIGH (ref 0–200)
HDL: 74.5 mg/dL (ref >39.00)
LDL Cholesterol: 140 mg/dL — ABNORMAL HIGH (ref 0–99)
NonHDL: 157.6
TRIGLYCERIDES: 86 mg/dL (ref 0.0–149.0)
Total CHOL/HDL Ratio: 3
VLDL: 17.2 mg/dL (ref 0.0–40.0)

## 2016-03-23 LAB — COMPREHENSIVE METABOLIC PANEL
ALBUMIN: 4.4 g/dL (ref 3.5–5.2)
ALK PHOS: 82 U/L (ref 39–117)
ALT: 22 U/L (ref 0–35)
AST: 23 U/L (ref 0–37)
BUN: 12 mg/dL (ref 6–23)
CALCIUM: 9.8 mg/dL (ref 8.4–10.5)
CHLORIDE: 105 meq/L (ref 96–112)
CO2: 28 mEq/L (ref 19–32)
Creatinine, Ser: 0.76 mg/dL (ref 0.40–1.20)
GFR: 80.35 mL/min (ref >60.00)
Glucose, Bld: 100 mg/dL — ABNORMAL HIGH (ref 70–99)
POTASSIUM: 4 meq/L (ref 3.5–5.1)
SODIUM: 140 meq/L (ref 135–145)
TOTAL PROTEIN: 7.5 g/dL (ref 6.0–8.3)
Total Bilirubin: 0.5 mg/dL (ref 0.2–1.2)

## 2016-03-23 LAB — TSH: TSH: 2.38 u[IU]/mL (ref 0.35–4.50)

## 2016-03-23 LAB — HEMOGLOBIN A1C: HEMOGLOBIN A1C: 5.6 % (ref 4.6–6.5)

## 2016-03-23 MED ORDER — METOPROLOL SUCCINATE ER 25 MG PO TB24: 25.0000 mg | ORAL_TABLET | Freq: Every day | ORAL | Status: DC

## 2016-03-23 NOTE — Progress Notes (Signed)
Pre visit review using our clinic review tool, if applicable. No additional management support is needed unless otherwise documented below in the visit note. 

## 2016-03-23 NOTE — Assessment & Plan Note (Signed)
May be from deconditioning, but concern over possible cardiac cause EKG today Discussed cardio referral or possible stress test - she deferred She wants to just monitor for now She will try increasing exercise and working on weight loss

## 2016-03-23 NOTE — Assessment & Plan Note (Signed)
EKG today Discussed holter and possible cardiology referral  Check labs

## 2016-03-23 NOTE — Assessment & Plan Note (Signed)
Pain since last summer Concern for arthritis Check xray today Will likely need to see orthopedics

## 2016-03-23 NOTE — Progress Notes (Signed)
Subjective:    Patient ID: Kimberly Stephenson, female    DOB: 10-Jul-1945, 71 y.o.   MRN: [id number]  HPI She is here for a physical exam.   Still having right groin and hip pain.  It did get better, but she is still having pain in the hip and pain in anterior upper leg.    Irregular HR for past few months:  It occurs mostly at night , sometimes during the day. She feels palpitations.  It can last up to an hour.   Since December her BP has been elevated.   At home it has been similar to today.  She feels it is related to eating poorly and gaining weight since December.  She was on medication in the past, but stopped them.  She does not want to restart medication.  She is working on making lifestyle changes.    She gets Sob with climbing steps x 3 years.  She is currently not exercising.     Chronic cough x 3 years.  She has taking xyzal for three days in a row and the cough resolves.  She does not take it continuously due to it making her very drowsy.  She has not tried claritin or allegra recently    Medications and allergies reviewed with patient and updated if appropriate.  Patient Active Problem List   Diagnosis Date Noted  . Chronic sinusitis 03/23/2016  . Vitamin D deficiency 06/08/2015  . 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 Cough 06/14/2014  . Cyst of buttocks 10/06/2012  . NONSPECIFIC ABNORMAL ELECTROCARDIOGRAM 07/19/2008  . Hyperlipemia 12/13/2006  . AMAUROSIS FUGAX 11/30/2006  . Essential hypertension 11/30/2006  . Irritable bowel syndrome 11/30/2006  . OVARIAN CYST 11/30/2006  . Rosacea 11/30/2006  . NONSPEC ELEVATION OF LEVELS OF TRANSAMINASE/LDH 11/30/2006    Current Outpatient Prescriptions on File Prior to Visit  Medication Sig Dispense Refill  . AMBULATORY NON FORMULARY MEDICATION Medication Name: CICIOPIROX topical solution - anti fungal    . AMBULATORY NON FORMULARY MEDICATION Apply topically as needed. Hydrocortisone 1% -  Iodoquirol 1%    . aspirin EC 325 MG tablet Take 1 tablet (325 mg total) by mouth daily. 30 tablet 0  . aspirin-acetaminophen-caffeine (EXCEDRIN MIGRAINE) 250-250-65 MG tablet Take 1 tablet by mouth daily. Reported on 04/08/2015    . Clindamycin-Benzoyl Per, Refr, (DUAC) gel Apply topically 2 (two) times daily.    . DiphenhydrAMINE HCl (BENADRYL ALLERGY PO) Take by mouth as needed.     . fluticasone (FLONASE) 50 MCG/ACT nasal spray Instill 1 spray into each nostril twice daily for rhinitis 16 g 5  . levocetirizine (XYZAL) 5 MG tablet Take 1 tablet (5 mg total) by mouth every evening. 30 tablet 5  . mometasone (ELOCON) 0.1 % cream as needed.    . Multiple Vitamins-Calcium (ONE-A-DAY WOMENS PO) Take 1 each by mouth daily.      . ramipril (ALTACE) 2.5 MG capsule Take 1 capsule (2.5 mg total) by mouth daily. 90 capsule 3   No current facility-administered medications on file prior to visit.     Past Medical History:  Diagnosis Date  . Amaurosis fugax    x 3 ; resolved with BP meds  . Colitis, ischemic ([place]) 2010   Dr. Earlean Shawl  . Irritable bowel syndrome    Dr Earlean Shawl, GI  . Milk intolerance   . Nonspecific elevation of levels of transaminase or lactic acid dehydrogenase (LDH)   . Other and  unspecified hyperlipidemia    [place] study LDL goal=<130; NMR LDL  goal = < 160, ideally < 130  . Other and unspecified ovarian cyst    PMHx  . Polydipsia   . Rosacea   . Sebaceous cyst     left buttock  . Unspecified essential hypertension     Past Surgical History:  Procedure Laterality Date  . APPENDECTOMY  1980  . CESAREAN SECTION     x 2; G2 P2  . COLONOSCOPY  2003, 2008   due 2018; Dr Earlean Shawl  . Peri ovarian cyst right resected  1980  . TONSILLECTOMY AND ADENOIDECTOMY    . UPPER GASTROINTESTINAL ENDOSCOPY  1986    Social History   Social History  . Marital status: Divorced    Spouse name: N/A  . Number of children: N/A  . Years of education: N/A   Occupational History  .  Teacher    Social History Main Topics  . Smoking status: Former Smoker    Quit date: 01/04/1969  . Smokeless tobacco: Never Used     Comment: smoked [place], up to 2 ppd  . Alcohol use No     Comment:  very rarely, < 1 glass of wine/ month  . Drug use: No  . Sexual activity: Not Asked   Other Topics Concern  . 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 Diabetes Sister   . Diabetes Father   . Dementia Father   . Heart attack Father 28  . Heart disease Father   . Diabetes Maternal Grandfather   . Heart attack Maternal Aunt      X 2;ages 56  & 65  . Lung cancer Maternal Aunt     smoker  . Stroke Paternal Grandfather   . Dementia Paternal Grandfather   . Heart failure Maternal Aunt 62  . Basal cell carcinoma Mother   . Hypertension Mother 37    Angioplasty  . Lung cancer Mother     Small cell  . Heart attack Mother 45  . Cancer Mother     lung and skin (basal cell carcinoma)  . Obesity Sister     x 2  . Thyroid nodules Sister   . Prostate cancer      nephew  . Other      PTH nodule, nephew  . Diabetes Paternal Grandmother     Review of Systems  Constitutional: Negative for appetite change and fever.  HENT: Positive for congestion.   Eyes: Negative for visual disturbance.  Respiratory: Positive for cough (chronic - sometimes productive of white phlegm) and shortness of breath. Negative for wheezing.   Cardiovascular: Positive for palpitations (not daily, may last up to an hour). Negative for chest pain and leg swelling.  Gastrointestinal: Negative for abdominal pain, blood in stool, constipation, diarrhea and nausea.       Jerrye Bushy - none  Genitourinary: Negative for dysuria and hematuria.  Musculoskeletal: Positive for arthralgias (righ hip).  Skin: Negative for color change and rash.  Neurological: Negative for light-headedness and headaches.    Psychiatric/Behavioral: Negative for dysphoric mood. The patient is not nervous/anxious.        Objective:   Vitals:   03/23/16 1045  BP: (!) 144/96  Pulse: 99  Resp: 16  Temp: 97.7 F (36.5 C)   Filed  Weights   03/23/16 1045  Weight: 161 lb (73 kg)   Body mass index is 31.44 kg/m.  Wt Readings from Last 3 Encounters:  03/23/16 161 lb (73 kg)  09/19/15 167 lb (75.8 kg)  04/08/15 174 lb 8 oz (79.2 kg)     Physical Exam Constitutional: She appears well-developed and well-nourished. No distress.  HENT:  Head: Normocephalic and atraumatic.  Right Ear: External ear normal. Normal ear canal and TM Left Ear: External ear normal.  Normal ear canal and TM Mouth/Throat: Oropharynx is clear and moist.  Eyes: Conjunctivae and EOM are normal.  Neck: Neck supple. No tracheal deviation present. No thyromegaly present.  No carotid bruit  Cardiovascular: Normal rate, regular rhythm and normal heart sounds.   No murmur heard.  No edema. Pulmonary/Chest: Effort normal and breath sounds normal. No respiratory distress. She has no wheezes. She has no rales.  Breast: deferred to Gyn Abdominal: Soft. She exhibits no distension. There is no tenderness.  Lymphadenopathy: She has no cervical adenopathy.  Skin: Skin is warm and dry. She is not diaphoretic.  Psychiatric: She has a normal mood and affect. Her behavior is normal.        Assessment & Plan:   Physical exam: Screening blood work  ordered Immunizations deferred pneumonia and shingles vaccines Colonoscopy - discussed with Dr Earlean Shawl - discussed colonoscopy vs cologuard - will do a cologuard Mammogram  Up to date  Gyn  Up to date  Dexa - had one, deferred dexa Eye exams  - up to date - dr Katy Fitch EKG   Today - sinus rhythm at 79 bpm, negative precordial T waves, no change compared to EKG from 2014 Exercise   No regular exercise Weight advised weight loss Skin   No concerns - sees derm annually Substance abuse  none  See  Problem List for Assessment and Plan of chronic medical problems.

## 2016-03-23 NOTE — Patient Instructions (Addendum)
Have blood work and an Insurance account manager done today.  Test(s) ordered today. Your results will be released to [facility] (or called to you) after review, usually within 72hours after test completion. If any changes need to be made, you will be notified at that same time.  All other Health Maintenance issues reviewed.   All recommended immunizations and age-appropriate screenings are up-to-date or discussed.  No immunizations administered today.   Medications reviewed and updated.  Changes include starting metoprolol 25 mg daily.   Please followup in 3 months    Health Maintenance, Female Adopting a healthy lifestyle and getting preventive care can go a long way to promote health and wellness. Talk with your health care provider about what schedule of regular examinations is right for you. This is a good chance for you to check in with your provider about disease prevention and staying healthy. In between checkups, there are plenty of things you can do on your own. Experts have done a lot of research about which lifestyle changes and preventive measures are most likely to keep you healthy. Ask your health care provider for more information. Weight and diet Eat a healthy diet  Be sure to include plenty of vegetables, fruits, low-fat dairy products, and lean protein.  Do not eat a lot of foods high in solid fats, added sugars, or salt.  Get regular exercise. This is one of the most important things you can do for your health.  Most adults should exercise for at least 150 minutes each week. The exercise should increase your heart rate and make you sweat (moderate-intensity exercise).  Most adults should also do strengthening exercises at least twice a week. This is in addition to the moderate-intensity exercise. Maintain a healthy weight  Body mass index (BMI) is a measurement that can be used to identify possible weight problems. It estimates body fat based on height and weight. Your health care  provider can help determine your BMI and help you achieve or maintain a healthy weight.  For females 64 years of age and older:  A BMI below 18.5 is considered underweight.  A BMI of 18.5 to 24.9 is normal.  A BMI of 25 to 29.9 is considered overweight.  A BMI of 30 and above is considered obese. Watch levels of cholesterol and blood lipids  You should start having your blood tested for lipids and cholesterol at 69 years of age, then have this test every 5 years.  You may need to have your cholesterol levels checked more often if:  Your lipid or cholesterol levels are high.  You are older than 69 years of age.  You are at high risk for heart disease. Cancer screening Lung Cancer  Lung cancer screening is recommended for adults 17-55 years old who are at high risk for lung cancer because of a history of smoking.  A yearly low-dose CT scan of the lungs is recommended for people who:  Currently smoke.  Have quit within the past 15 years.  Have at least a 30-pack-year history of smoking. A pack year is smoking an average of one pack of cigarettes a day for 1 year.  Yearly screening should continue until it has been 15 years since you quit.  Yearly screening should stop if you develop a health problem that would prevent you from having lung cancer treatment. Breast Cancer  Practice breast self-awareness. This means understanding how your breasts normally appear and feel.  It also means doing regular breast self-exams. Let your  health care provider know about any changes, no matter how small.  If you are in your 20s or 30s, you should have a clinical breast exam (CBE) by a health care provider every 1-3 years as part of a regular health exam.  If you are 63 or older, have a CBE every year. Also consider having a breast X-ray (mammogram) every year.  If you have a family history of breast cancer, talk to your health care provider about genetic screening.  If you are at high  risk for breast cancer, talk to your health care provider about having an MRI and a mammogram every year.  Breast cancer gene (BRCA) assessment is recommended for women who have family members with BRCA-related cancers. BRCA-related cancers include:  Breast.  Ovarian.  Tubal.  Peritoneal cancers.  Results of the assessment will determine the need for genetic counseling and BRCA1 and BRCA2 testing. Cervical Cancer  Your health care provider may recommend that you be screened regularly for cancer of the pelvic organs (ovaries, uterus, and vagina). This screening involves a pelvic examination, including checking for microscopic changes to the surface of your cervix (Pap test). You may be encouraged to have this screening done every 3 years, beginning at age 10.  For women ages 74-65, health care providers may recommend pelvic exams and Pap testing every 3 years, or they may recommend the Pap and pelvic exam, combined with testing for human papilloma virus (HPV), every 5 years. Some types of HPV increase your risk of cervical cancer. Testing for HPV may also be done on women of any age with unclear Pap test results.  Other health care providers may not recommend any screening for nonpregnant women who are considered low risk for pelvic cancer and who do not have symptoms. Ask your health care provider if a screening pelvic exam is right for you.  If you have had past treatment for cervical cancer or a condition that could lead to cancer, you need Pap tests and screening for cancer for at least 20 years after your treatment. If Pap tests have been discontinued, your risk factors (such as having a new sexual partner) need to be reassessed to determine if screening should resume. Some women have medical problems that increase the chance of getting cervical cancer. In these cases, your health care provider may recommend more frequent screening and Pap tests. Colorectal Cancer  This type of cancer can  be detected and often prevented.  Routine colorectal cancer screening usually begins at 69 years of age and continues through 69 years of age.  Your health care provider may recommend screening at an earlier age if you have risk factors for colon cancer.  Your health care provider may also recommend using home test kits to check for hidden blood in the stool.  A small camera at the end of a tube can be used to examine your colon directly (sigmoidoscopy or colonoscopy). This is done to check for the earliest forms of colorectal cancer.  Routine screening usually begins at age 31.  Direct examination of the colon should be repeated every 5-10 years through 69 years of age. However, you may need to be screened more often if early forms of precancerous polyps or small growths are found. Skin Cancer  Check your skin from head to toe regularly.  Tell your health care provider about any new moles or changes in moles, especially if there is a change in a mole's shape or color.  Also tell your  health care provider if you have a mole that is larger than the size of a pencil eraser.  Always use sunscreen. Apply sunscreen liberally and repeatedly throughout the day.  Protect yourself by wearing long sleeves, pants, a wide-brimmed hat, and sunglasses whenever you are outside. Heart disease, diabetes, and high blood pressure  High blood pressure causes heart disease and increases the risk of stroke. High blood pressure is more likely to develop in:  People who have blood pressure in the high end of the normal range (130-139/85-89 mm Hg).  People who are overweight or obese.  People who are African American.  If you are 48-27 years of age, have your blood pressure checked every 3-5 years. If you are 7 years of age or older, have your blood pressure checked every year. You should have your blood pressure measured twice-once when you are at a hospital or clinic, and once when you are not at a  hospital or clinic. Record the average of the two measurements. To check your blood pressure when you are not at a hospital or clinic, you can use:  An automated blood pressure machine at a pharmacy.  A home blood pressure monitor.  If you are between 45 years and 66 years old, ask your health care provider if you should take aspirin to prevent strokes.  Have regular diabetes screenings. This involves taking a blood sample to check your fasting blood sugar level.  If you are at a normal weight and have a low risk for diabetes, have this test once every three years after 69 years of age.  If you are overweight and have a high risk for diabetes, consider being tested at a younger age or more often. Preventing infection Hepatitis B  If you have a higher risk for hepatitis B, you should be screened for this virus. You are considered at high risk for hepatitis B if:  You were born in a country where hepatitis B is common. Ask your health care provider which countries are considered high risk.  Your parents were born in a high-risk country, and you have not been immunized against hepatitis B (hepatitis B vaccine).  You have HIV or AIDS.  You use needles to inject street drugs.  You live with someone who has hepatitis B.  You have had sex with someone who has hepatitis B.  You get hemodialysis treatment.  You take certain medicines for conditions, including cancer, organ transplantation, and autoimmune conditions. Hepatitis C  Blood testing is recommended for:  Everyone born from [birth year] through 1963.  Anyone with known risk factors for hepatitis C. Sexually transmitted infections (STIs)  You should be screened for sexually transmitted infections (STIs) including gonorrhea and chlamydia if:  You are sexually active and are younger than 69 years of age.  You are older than 69 years of age and your health care provider tells you that you are at risk for this type of  infection.  Your sexual activity has changed since you were last screened and you are at an increased risk for chlamydia or gonorrhea. Ask your health care provider if you are at risk.  If you do not have HIV, but are at risk, it may be recommended that you take a prescription medicine daily to prevent HIV infection. This is called pre-exposure prophylaxis (PrEP). You are considered at risk if:  You are sexually active and do not regularly use condoms or know the HIV status of your partner(s).  You take drugs  by injection.  You are sexually active with a partner who has HIV. Talk with your health care provider about whether you are at high risk of being infected with HIV. If you choose to begin PrEP, you should first be tested for HIV. You should then be tested every 3 months for as long as you are taking PrEP. Pregnancy  If you are premenopausal and you may become pregnant, ask your health care provider about preconception counseling.  If you may become pregnant, take 400 to 800 micrograms (mcg) of folic acid every day.  If you want to prevent pregnancy, talk to your health care provider about birth control (contraception). Osteoporosis and menopause  Osteoporosis is a disease in which the bones lose minerals and strength with aging. This can result in serious bone fractures. Your risk for osteoporosis can be identified using a bone density scan.  If you are 9 years of age or older, or if you are at risk for osteoporosis and fractures, ask your health care provider if you should be screened.  Ask your health care provider whether you should take a calcium or vitamin D supplement to lower your risk for osteoporosis.  Menopause may have certain physical symptoms and risks.  Hormone replacement therapy may reduce some of these symptoms and risks. Talk to your health care provider about whether hormone replacement therapy is right for you. Follow these instructions at home:  Schedule  regular health, dental, and eye exams.  Stay current with your immunizations.  Do not use any tobacco products including cigarettes, chewing tobacco, or electronic cigarettes.  If you are pregnant, do not drink alcohol.  If you are breastfeeding, limit how much and how often you drink alcohol.  Limit alcohol intake to no more than 1 drink per day for nonpregnant women. One drink equals 12 ounces of beer, 5 ounces of wine, or 1 ounces of hard liquor.  Do not use street drugs.  Do not share needles.  Ask your health care provider for help if you need support or information about quitting drugs.  Tell your health care provider if you often feel depressed.  Tell your health care provider if you have ever been abused or do not feel safe at home. This information is not intended to replace advice given to you by your health care provider. Make sure you discuss any questions you have with your health care provider. Document Released: 07/06/2010 Document Revised: 05/29/2015 Document Reviewed: 09/24/2014 Elsevier Interactive Patient Education  2017 [employer].

## 2016-03-23 NOTE — Assessment & Plan Note (Signed)
Resolves with taking xyzal daily, but she has side effects Try allegra or claritin and take daily flonase

## 2016-03-23 NOTE — Assessment & Plan Note (Signed)
Start claritin and take daily Continue saline nasal spray and flonase

## 2016-03-23 NOTE — Assessment & Plan Note (Signed)
Was on ramipril and metoprolol in the past - she stopped them BP consistently elevated and I have recommended restarting medication She does not want to start medication - She wants to work on weight loss and dietary changes Monitor BP at home Check labs

## 2016-03-23 NOTE — Assessment & Plan Note (Signed)
Check lipid panel  

## 2016-03-24 ENCOUNTER — Encounter: Payer: Self-pay | Admitting: Internal Medicine

## 2016-03-26 ENCOUNTER — Encounter: Payer: Self-pay | Admitting: Internal Medicine

## 2016-04-09 ENCOUNTER — Other Ambulatory Visit: Payer: Self-pay | Admitting: Internal Medicine

## 2016-04-13 LAB — COLOGUARD: Cologuard: NEGATIVE

## 2016-04-15 ENCOUNTER — Other Ambulatory Visit: Payer: Self-pay | Admitting: Internal Medicine

## 2016-05-05 ENCOUNTER — Telehealth: Payer: Self-pay | Admitting: Internal Medicine

## 2016-05-05 ENCOUNTER — Encounter: Payer: Self-pay | Admitting: Internal Medicine

## 2016-05-05 NOTE — Telephone Encounter (Signed)
Pt would like to know the results from her ColoGuard that she had done. She spoke with the [employer] and they told her that the results have been released to [place] but they could not give her any information. She has looked on her MyChart but there are no results on there. She said that a message can be sent to her through MyChart or you can call her home phone number [phone]). Thanks.

## 2016-05-06 NOTE — Telephone Encounter (Signed)
Spoke with pt to give results. Chart updated. Also called GYN office to get mammogram report faxed over.

## 2016-05-13 ENCOUNTER — Encounter: Payer: Self-pay | Admitting: Internal Medicine

## 2016-06-04 ENCOUNTER — Encounter: Payer: Self-pay | Admitting: Internal Medicine

## 2016-06-04 ENCOUNTER — Ambulatory Visit (INDEPENDENT_AMBULATORY_CARE_PROVIDER_SITE_OTHER): Payer: Medicare Other | Admitting: Internal Medicine

## 2016-06-04 VITALS — BP 142/82 | HR 78 | Temp 98.6°F | Resp 16 | Wt 166.0 lb

## 2016-06-04 DIAGNOSIS — J329 Chronic sinusitis, unspecified: Secondary | ICD-10-CM | POA: Diagnosis not present

## 2016-06-04 DIAGNOSIS — H47011 Ischemic optic neuropathy, right eye: Secondary | ICD-10-CM | POA: Diagnosis not present

## 2016-06-04 DIAGNOSIS — H9319 Tinnitus, unspecified ear: Secondary | ICD-10-CM | POA: Insufficient documentation

## 2016-06-04 DIAGNOSIS — I10 Essential (primary) hypertension: Secondary | ICD-10-CM | POA: Diagnosis not present

## 2016-06-04 NOTE — Assessment & Plan Note (Signed)
Symptoms resolved with augmentin and dexamethasone

## 2016-06-04 NOTE — Patient Instructions (Addendum)
   Medications reviewed and updated. No changes recommended at this time.    Follow up in March 2019

## 2016-06-04 NOTE — Assessment & Plan Note (Addendum)
Stopped ASA due to ringing in ears - discussed risk of stroke - advised at least a baby asa daily Declined neuro evaluation/consult

## 2016-06-04 NOTE — Assessment & Plan Note (Signed)
BP well controlled at home, elevated here Continue to monitor at home Current regimen effective and well tolerated Continue current medications at current doses

## 2016-06-04 NOTE — Assessment & Plan Note (Signed)
Ringing is likely in her ears not her head No hearing loss Deferred ENT eval, can consider audiology evaluation in future She has stopped the ASA and ringing may improve

## 2016-06-04 NOTE — Progress Notes (Signed)
Subjective:    Patient ID: Kimberly Stephenson, female    DOB: [birth year], 71 y.o.   MRN: [id number]  HPI She is here for an acute visit.   She had oral surgery last Thursday to remove a molar.  She was placed on prednisone and amoxicillin 500 mg TID.  She has done well since surgery.  It has cleared up her chronic sinus issue and has cleared up her lung issue.  She feels her cough is gone and she is using more of her lung capacity.  She no longer has PND.  She is no longer waking up with headaches.  She has head ringing for two years - it was intermittent and is now constant.  The head ringing did resolve with the antibiotics.  After she completed the medication her head ringing has returned.   She feels the ringing in her head is in her head, not her ears.  She denies hearing loss.  She stopped the aspirin four days ago.  There ringing is now ringing at 30%.  She was concerned about the possibility of brain cancer or meningitis.   Hypertension: She is taking her medication daily. She is compliant with a low sodium diet.  She denies chest pain, palpitations, edema, shortness of breath and regular headaches.  She does monitor her blood pressure at home - 120/80's.  She was concerned she may have heart failure due to having chest pain once when she drank water.     Medications and allergies reviewed with patient and updated if appropriate.  Patient Active Problem List   Diagnosis Date Noted  . Chronic sinusitis 03/23/2016  . Pain of right hip joint 03/23/2016  . Palpitations 03/23/2016  . DOE (dyspnea on exertion) 03/23/2016  . Vitamin D deficiency 06/08/2015  . 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 Cough 06/14/2014  . NONSPECIFIC ABNORMAL ELECTROCARDIOGRAM 07/19/2008  . Hyperlipemia 12/13/2006  . AMAUROSIS FUGAX 11/30/2006  . Essential hypertension 11/30/2006  . Irritable bowel syndrome 11/30/2006  . OVARIAN CYST 11/30/2006  . Rosacea 11/30/2006  . NONSPEC  ELEVATION OF LEVELS OF TRANSAMINASE/LDH 11/30/2006    Current Outpatient Prescriptions on File Prior to Visit  Medication Sig Dispense Refill  . AMBULATORY NON FORMULARY MEDICATION Medication Name: CICIOPIROX topical solution - anti fungal    . AMBULATORY NON FORMULARY MEDICATION Apply topically as needed. Hydrocortisone 1% - Iodoquirol 1%    . aspirin EC 325 MG tablet Take 1 tablet (325 mg total) by mouth daily. 30 tablet 0  . Clindamycin-Benzoyl Per, Refr, (DUAC) gel Apply topically 2 (two) times daily.    . metoprolol succinate (TOPROL-XL) 25 MG 24 hr tablet Take 1 tablet (25 mg total) by mouth daily. take 1 tablet by mouth once daily    . mometasone (ELOCON) 0.1 % cream as needed.    . Multiple Vitamins-Calcium (ONE-A-DAY WOMENS PO) Take 1 each by mouth daily.      Marland Kitchen aspirin-acetaminophen-caffeine (EXCEDRIN MIGRAINE) 250-250-65 MG tablet Take 1 tablet by mouth daily. Reported on 04/08/2015    . DiphenhydrAMINE HCl (BENADRYL ALLERGY PO) Take by mouth as needed.     . fluticasone (FLONASE) 50 MCG/ACT nasal spray Instill 1 spray into each nostril twice daily for rhinitis (Patient not taking: Reported on 06/04/2016) 16 g 5  . levocetirizine (XYZAL) 5 MG tablet take 1 tablet by mouth every evening (Patient not taking: Reported on 06/04/2016) 30 tablet 5   No current facility-administered medications on file prior to  visit.     Past Medical History:  Diagnosis Date  . Amaurosis fugax    x 3 ; resolved with BP meds  . Colitis, ischemic ([place]) 2010   Dr. Earlean Shawl  . Irritable bowel syndrome    Dr Earlean Shawl, GI  . Milk intolerance   . Nonspecific elevation of levels of transaminase or lactic acid dehydrogenase (LDH)   . Other and unspecified hyperlipidemia    [place] study LDL goal=<130; NMR LDL  goal = < 160, ideally < 130  . Other and unspecified ovarian cyst    PMHx  . Polydipsia   . Rosacea   . Sebaceous cyst     left buttock  . Unspecified essential hypertension     Past Surgical  History:  Procedure Laterality Date  . APPENDECTOMY  1980  . CESAREAN SECTION     x 2; G2 P2  . COLONOSCOPY  2003, 2008   due 2018; Dr Earlean Shawl  . Peri ovarian cyst right resected  1980  . TONSILLECTOMY AND ADENOIDECTOMY    . UPPER GASTROINTESTINAL ENDOSCOPY  1986    Social History   Social History  . Marital status: Divorced    Spouse name: N/A  . Number of children: N/A  . Years of education: N/A   Occupational History  . Teacher    Social History Main Topics  . Smoking status: Former Smoker    Quit date: 01/04/1969  . Smokeless tobacco: Never Used     Comment: smoked [place], up to 2 ppd  . Alcohol use No     Comment:  very rarely, < 1 glass of wine/ month  . Drug use: No  . Sexual activity: Not on file   Other Topics Concern  . 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 Diabetes Father   . Dementia Father   . Heart attack Father 50  . Heart disease Father   . Basal cell carcinoma Mother   . Hypertension Mother 73       Angioplasty  . Lung cancer Mother        Small cell  . Heart attack Mother 33  . Cancer Mother        lung and skin (basal cell carcinoma)  . Diabetes Sister   . Diabetes Maternal Grandfather   . Heart attack Maternal Aunt         X 2;ages 56  & 65  . Lung cancer Maternal Aunt        smoker  . Stroke Paternal Grandfather   . Dementia Paternal Grandfather   . Heart failure Maternal Aunt 62  . Obesity Sister        x 2  . Thyroid nodules Sister   . Prostate cancer Unknown        nephew  . Other Unknown        PTH nodule, nephew  . Diabetes Paternal Grandmother     Review of Systems  Constitutional: Negative for chills and fever.  HENT: Positive for tinnitus (ringing in head - not ears). Negative for congestion, postnasal drip, sinus pain and sinus pressure.   Respiratory: Negative for cough, shortness of breath and  wheezing.   Cardiovascular: Negative for chest pain, palpitations and leg swelling.  Neurological: Negative for light-headedness and headaches.  Objective:   Vitals:   06/04/16 1050  BP: (!) 142/82  Pulse: 78  Resp: 16  Temp: 98.6 F (37 C)   Filed Weights   06/04/16 1050  Weight: 166 lb (75.3 kg)   Body mass index is 32.42 kg/m.  Wt Readings from Last 3 Encounters:  06/04/16 166 lb (75.3 kg)  03/23/16 161 lb (73 kg)  09/19/15 167 lb (75.8 kg)     Physical Exam  Constitutional: She appears well-developed and well-nourished. No distress.  HENT:  Head: Normocephalic and atraumatic.  Eyes: Conjunctivae are normal.  Pulmonary/Chest: Effort normal. No respiratory distress.  Musculoskeletal: She exhibits no edema.  Skin: Skin is warm and dry. She is not diaphoretic.          Assessment & Plan:   See Problem List for Assessment and Plan of chronic medical problems.

## 2016-06-23 ENCOUNTER — Ambulatory Visit: Payer: Medicare Other | Admitting: Internal Medicine

## 2016-09-26 ENCOUNTER — Other Ambulatory Visit: Payer: Self-pay | Admitting: Internal Medicine

## 2016-12-13 ENCOUNTER — Ambulatory Visit: Payer: Self-pay | Admitting: *Deleted

## 2016-12-13 NOTE — Telephone Encounter (Signed)
Pt c/o pain in inner right ear. NO drainage noted. Taking Advil for pain and using heat to the site with some relief. Now the left ear is starting to feel the same way.  Home care advice given to pt. Going to urgent care for possible ear infection.  Reason for Disposition . Earache  (Exceptions: brief ear pain of < 60 minutes duration, earache occurring during air travel  Answer Assessment - Initial Assessment Questions 1. LOCATION: "Which ear is involved?"     Right inner ear 2. ONSET: "When did the ear start hurting"      Last night 3. SEVERITY: "How bad is the pain?"  (Scale 1-10; mild, moderate or severe)   - MILD (1-3): doesn't interfere with normal activities    - MODERATE (4-7): interferes with normal activities or awakens from sleep    - SEVERE (8-10): excruciating pain, unable to do any normal activities      8 last night and now a 6 4. URI SYMPTOMS: " Do you have a runny nose or cough?"     cough 5. FEVER: "Do you have a fever?" If so, ask: "What is your temperature, how was it measured, and when did it start?"     Felt hot and cold Saturday and sunday 6. CAUSE: "Have you been swimming recently?", "How often do you use Q-TIPS?", "Have you had any recent air travel or scuba diving?"     Not swimming, cleaned ears out with Q-tip 7. OTHER SYMPTOMS: "Do you have any other symptoms?" (e.g., headache, stiff neck, dizziness, vomiting, runny nose, decreased hearing)     Headache yesterday, a ringing or buzzing in the ear 8. PREGNANCY: "Is there any chance you are pregnant?" "When was your last menstrual period?"     no  Protocols used: EARACHE-A-AH

## 2016-12-15 ENCOUNTER — Ambulatory Visit: Payer: Medicare Other | Admitting: Internal Medicine

## 2016-12-15 ENCOUNTER — Encounter: Payer: Self-pay | Admitting: Internal Medicine

## 2016-12-15 VITALS — BP 138/84 | HR 114 | Temp 98.9°F | Resp 16 | Wt 171.0 lb

## 2016-12-15 DIAGNOSIS — H9191 Unspecified hearing loss, right ear: Secondary | ICD-10-CM | POA: Diagnosis not present

## 2016-12-15 DIAGNOSIS — H6691 Otitis media, unspecified, right ear: Secondary | ICD-10-CM | POA: Insufficient documentation

## 2016-12-15 MED ORDER — AMOXICILLIN 500 MG PO CAPS: 500.0000 mg | ORAL_CAPSULE | Freq: Three times a day (TID) | ORAL | 0 refills | Status: DC

## 2016-12-15 MED ORDER — DEXAMETHASONE 6 MG PO TABS: 6.0000 mg | ORAL_TABLET | Freq: Every day | ORAL | 0 refills | Status: DC

## 2016-12-15 NOTE — Progress Notes (Signed)
Subjective:    Patient ID: Kimberly Stephenson, female    DOB: 1946-01-05, 71 y.o.   MRN: [id number]  HPI She is here for an acute visit for cold symptoms.  Her symptoms started several days ago.  She is experiencing right ear pain x 3 days ago - it was severe up until yesterday.  Low grade fever, sore throat x 5 days.  Chest pain that felt like she was getting bronchitis.  She coughed up a little.  She does not feel that the bronchitis is developing.  She has had some hearing loss and increased tinnitus in the right ear.   She has taken sudafed, Advil and Excedrin.  . The medication seems to be helping.  She did not take any today.  Medications and allergies reviewed with patient and updated if appropriate.  Patient Active Problem List   Diagnosis Date Noted  . Tinnitus 06/04/2016  . Chronic sinusitis 03/23/2016  . Pain of right hip joint 03/23/2016  . Palpitations 03/23/2016  . DOE (dyspnea on exertion) 03/23/2016  . Vitamin D deficiency 06/08/2015  . 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 Cough 06/14/2014  . NONSPECIFIC ABNORMAL ELECTROCARDIOGRAM 07/19/2008  . Hyperlipemia 12/13/2006  . AMAUROSIS FUGAX 11/30/2006  . Essential hypertension 11/30/2006  . Irritable bowel syndrome 11/30/2006  . OVARIAN CYST 11/30/2006  . Rosacea 11/30/2006  . NONSPEC ELEVATION OF LEVELS OF TRANSAMINASE/LDH 11/30/2006    Current Outpatient Medications on File Prior to Visit  Medication Sig Dispense Refill  . AMBULATORY NON FORMULARY MEDICATION Medication Name: CICIOPIROX topical solution - anti fungal    . AMBULATORY NON FORMULARY MEDICATION Apply topically as needed. Hydrocortisone 1% - Iodoquirol 1%    . aspirin EC 325 MG tablet Take 1 tablet (325 mg total) by mouth daily. 30 tablet 0  . aspirin-acetaminophen-caffeine (EXCEDRIN MIGRAINE) 250-250-65 MG tablet Take 1 tablet by mouth daily. Reported on 04/08/2015    . Clindamycin-Benzoyl Per, Refr, (DUAC) gel Apply  topically 2 (two) times daily.    . DiphenhydrAMINE HCl (BENADRYL ALLERGY PO) Take by mouth as needed.     . fluticasone (FLONASE) 50 MCG/ACT nasal spray Instill 1 spray into each nostril twice daily for rhinitis 16 g 5  . levocetirizine (XYZAL) 5 MG tablet take 1 tablet by mouth every evening 30 tablet 5  . metoprolol succinate (TOPROL-XL) 25 MG 24 hr tablet Take 1 tablet (25 mg total) by mouth daily. take 1 tablet by mouth once daily    . metoprolol succinate (TOPROL-XL) 50 MG 24 hr tablet take 1 tablet by mouth once daily 90 tablet 1  . mometasone (ELOCON) 0.1 % cream as needed.    . Multiple Vitamins-Calcium (ONE-A-DAY WOMENS PO) Take 1 each by mouth daily.       No current facility-administered medications on file prior to visit.     Past Medical History:  Diagnosis Date  . Amaurosis fugax    x 3 ; resolved with BP meds  . Colitis, ischemic ([place]) 2010   Dr. Earlean Shawl  . Irritable bowel syndrome    Dr Earlean Shawl, GI  . Milk intolerance   . Nonspecific elevation of levels of transaminase or lactic acid dehydrogenase (LDH)   . Other and unspecified hyperlipidemia    [place] study LDL goal=<130; NMR LDL  goal = < 160, ideally < 130  . Other and unspecified ovarian cyst    PMHx  . Polydipsia   . Rosacea   . Sebaceous cyst  left buttock  . Unspecified essential hypertension     Past Surgical History:  Procedure Laterality Date  . APPENDECTOMY  1980  . CESAREAN SECTION     x 2; G2 P2  . COLONOSCOPY  2003, 2008   due 2018; Dr Earlean Shawl  . Peri ovarian cyst right resected  1980  . TONSILLECTOMY AND ADENOIDECTOMY    . UPPER GASTROINTESTINAL ENDOSCOPY  1986    Social History   Socioeconomic History  . Marital status: Divorced    Spouse name: None  . Number of children: None  . Years of education: None  . Highest education level: None  Social Needs  . Financial resource strain: None  . Food insecurity - worry: None  . Food insecurity - inability: None  . Transportation  needs - medical: None  . Transportation needs - non-medical: None  Occupational History  . Occupation: Pharmacist, hospital  Tobacco Use  . Smoking status: Former Smoker    Last attempt to quit: 01/04/1969    Years since quitting: 47.9  . Smokeless tobacco: Never Used  . Tobacco comment: smoked [place], up to 2 ppd  Substance and Sexual Activity  . Alcohol use: No    Comment:  very rarely, < 1 glass of wine/ month  . Drug use: No  . Sexual activity: None  Other Topics Concern  . 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 Diabetes Father   . Dementia Father   . Heart attack Father 40  . Heart disease Father   . Basal cell carcinoma Mother   . Hypertension Mother 65       Angioplasty  . Lung cancer Mother        Small cell  . Heart attack Mother 4  . Cancer Mother        lung and skin (basal cell carcinoma)  . Diabetes Sister   . Diabetes Maternal Grandfather   . Heart attack Maternal Aunt         X 2;ages 56  & 65  . Lung cancer Maternal Aunt        smoker  . Stroke Paternal Grandfather   . Dementia Paternal Grandfather   . Heart failure Maternal Aunt 62  . Obesity Sister        x 2  . Thyroid nodules Sister   . Prostate cancer Unknown        nephew  . Other Unknown        PTH nodule, nephew  . Diabetes Paternal Grandmother     Review of Systems  Constitutional: Positive for fever (low grade).  HENT: Positive for congestion, ear pain (right, mild in left), hearing loss (right ear only), sinus pressure, sore throat and tinnitus (worse than usual).   Respiratory: Positive for cough (minimal). Negative for shortness of breath and wheezing.   Gastrointestinal: Negative for diarrhea and nausea.  Musculoskeletal: Negative for myalgias.  Neurological: Positive for headaches. Negative for dizziness and light-headedness.       Objective:   Vitals:   12/15/16 1518  BP:  138/84  Pulse: (!) 114  Resp: 16  Temp: 98.9 F (37.2 C)  SpO2: 96%   Filed Weights   12/15/16 1518  Weight: 171 lb (77.6 kg)   Body mass index is 33.4 kg/m.  Wt Readings from Last 3 Encounters:  12/15/16 171 lb (77.6 kg)  06/04/16 166 lb (75.3 kg)  03/23/16 161 lb (73 kg)     Physical Exam GENERAL APPEARANCE: Appears stated age, well appearing, NAD EYES: conjunctiva clear, no icterus HEENT: Left ear canal and tympanic membrane normal.  Right ear canal normal.  Right tympanic membrane erythematous and slightly bulging, oropharynx with minimal erythema, no thyromegaly, trachea midline, no cervical or supraclavicular lymphadenopathy LUNGS: Clear to auscultation without wheeze or crackles, unlabored breathing, good air entry bilaterally CARDIOVASCULAR: Normal S1,S2 without murmurs, no edema SKIN: warm, dry        Assessment & Plan:   See Problem List for Assessment and Plan of chronic medical problems.

## 2016-12-15 NOTE — Assessment & Plan Note (Signed)
Hearing loss related to otitis media Has not done well with prednisone in the past, but has done well with that dexamethasone-start 6 mg daily for 5 days Call if no improvement-may need to see ENT Continue Sudafed

## 2016-12-15 NOTE — Patient Instructions (Signed)
Take the antibiotic and steroid as prescribed. You can continue the sudafed and take tylenol as needed for pain.    Call if no improvement     Otitis Media, Adult Otitis media occurs when there is inflammation and fluid in the middle ear. Your middle ear is a part of the ear that contains bones for hearing as well as air that helps send sounds to your brain. What are the causes? This condition is caused by a blockage in the eustachian tube. This tube drains fluid from the ear to the back of the nose (nasopharynx). A blockage in this tube can be caused by an object or by swelling (edema) in the tube. Problems that can cause a blockage include:  A cold or other upper respiratory infection.  Allergies.  An irritant, such as tobacco smoke.  Enlarged adenoids. The adenoids are areas of soft tissue located high in the back of the throat, behind the nose and the roof of the mouth.  A mass in the nasopharynx.  Damage to the ear caused by pressure changes (barotrauma).  What are the signs or symptoms? Symptoms of this condition include:  Ear pain.  A fever.  Decreased hearing.  A headache.  Tiredness (lethargy).  Fluid leaking from the ear.  Ringing in the ear.  How is this diagnosed? This condition is diagnosed with a physical exam. During the exam your health care provider will use an instrument called an otoscope to look into your ear and check for redness, swelling, and fluid. He or she will also ask about your symptoms. Your health care provider may also order tests, such as:  A test to check the movement of the eardrum (pneumatic otoscopy). This test is done by squeezing a small amount of air into the ear.  A test that changes air pressure in the middle ear to check how well the eardrum moves and whether the eustachian tube is working (tympanogram).  How is this treated? This condition usually goes away on its own within 3-5 days. But if the condition is caused by a  bacteria infection and does not go away own its own, or keeps coming back, your health care provider may:  Prescribe antibiotic medicines to treat the infection.  Prescribe or recommend medicines to control pain.  Follow these instructions at home:  Take over-the-counter and prescription medicines only as told by your health care provider.  If you were prescribed an antibiotic medicine, take it as told by your health care provider. Do not stop taking the antibiotic even if you start to feel better.  Keep all follow-up visits as told by your health care provider. This is important. Contact a health care provider if:  You have bleeding from your nose.  There is a lump on your neck.  You are not getting better in 5 days.  You feel worse instead of better. Get help right away if:  You have severe pain that is not controlled with medicine.  You have swelling, redness, or pain around your ear.  You have stiffness in your neck.  A part of your face is paralyzed.  The bone behind your ear (mastoid) is tender when you touch it.  You develop a severe headache. Summary  Otitis media is redness, soreness, and swelling of the middle ear.  This condition usually goes away on its own within 3-5 days.  If the problem does not go away in 3-5 days, your health care provider may prescribe or recommend medicines  to treat your symptoms.  If you were prescribed an antibiotic medicine, take it as told by your health care provider. This information is not intended to replace advice given to you by your health care provider. Make sure you discuss any questions you have with your health care provider. Document Released: 09/26/2003 Document Revised: 12/12/2015 Document Reviewed: 12/12/2015 Elsevier Interactive Patient Education  2017 [employer].

## 2016-12-15 NOTE — Assessment & Plan Note (Signed)
Start amoxicillin 3 times daily times 10 days Continue Sudafed Dexamethasone for hearing loss Tylenol until she is done with dexamethasone-then she can restart Advil or Excedrin Call if no improvement

## 2016-12-23 ENCOUNTER — Telehealth: Payer: Self-pay | Admitting: Internal Medicine

## 2016-12-23 DIAGNOSIS — H9209 Otalgia, unspecified ear: Secondary | ICD-10-CM

## 2016-12-23 NOTE — Telephone Encounter (Unsigned)
Copied from [place]. Topic: Inquiry >> Dec 23, 2016 10:27 AM Malena Catholic I, NT wrote: Reason for CRM: she need to talk to Doctor Burns Nurse she being have Earache and what to be send to ENT for her Ear a soon is possible.

## 2016-12-23 NOTE — Telephone Encounter (Signed)
Copied from [place]. Topic: Inquiry >> Dec 23, 2016 10:27 AM Kimberly Stephenson I, NT wrote: Reason for CRM: she need to talk to Doctor Burns Nurse she being have Earache and what to be send to ENT for her Ear a soon is possible.

## 2016-12-23 NOTE — Telephone Encounter (Signed)
I spoke to pt and got her scheduled with ENT.   She also wanted to ask for an order for CT scan for the ringing in her head that has been going on constant for 3 years.  She does not want to do an MRI. She wants to rule out a tumor and would it show up on the CT.  She wants to know if you are able to order it or if she should see a neurologist or even the ENT doctor and they would order it. However, she is wanting to get this CT scan done by Saturday.

## 2016-12-23 NOTE — Telephone Encounter (Signed)
Spoke with pt to inform referral was entered. Pt has an appt with ENT for next week. Pt advised that she had an episode of increased HR and diarrhea. Pt is aware that it may be a symptom from the current infection. Advised her that this is not an episode of A-fib like she suggested. Pt also asked if a CT of her head was needed to rule out tumor due to the ringing in her ears. Advised her that she should see ENT before further eval.

## 2016-12-23 NOTE — Telephone Encounter (Signed)
Referral ordered urgent.

## 2017-02-25 ENCOUNTER — Encounter: Payer: Self-pay | Admitting: Internal Medicine

## 2017-03-23 NOTE — Progress Notes (Signed)
Subjective:    Patient ID: Kimberly Stephenson, female    DOB: 05/09/1945, 72 y.o.   MRN: [id number]  HPI She is here for a physical exam.   She tried to go off the metoprolol because of possible side effects.  Her BP spiked and after a few days it came down.  She has started exercising and eating better.  She has lost some weight and is determined by the fall to lose a lot more weight-her goal is 140 pounds on October 15.  She is using my fitness pal and it is helping.  She walks and is swimming once a week.  Medications and allergies reviewed with patient and updated if appropriate.  Patient Active Problem List   Diagnosis Date Noted  . Hearing loss of right ear 12/15/2016  . Tinnitus 06/04/2016  . Chronic sinusitis 03/23/2016  . Pain of right hip joint 03/23/2016  . Palpitations 03/23/2016  . DOE (dyspnea on exertion) 03/23/2016  . Vitamin D deficiency 06/08/2015  . 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 Cough 06/14/2014  . NONSPECIFIC ABNORMAL ELECTROCARDIOGRAM 07/19/2008  . Hyperlipemia 12/13/2006  . AMAUROSIS FUGAX 11/30/2006  . Essential hypertension 11/30/2006  . Irritable bowel syndrome 11/30/2006  . OVARIAN CYST 11/30/2006  . Rosacea 11/30/2006  . NONSPEC ELEVATION OF LEVELS OF TRANSAMINASE/LDH 11/30/2006    Current Outpatient Medications on File Prior to Visit  Medication Sig Dispense Refill  . aspirin-acetaminophen-caffeine (EXCEDRIN MIGRAINE) 250-250-65 MG tablet Take 1 tablet by mouth daily. Reported on 04/08/2015    . Clindamycin-Benzoyl Per, Refr, (DUAC) gel Apply topically 2 (two) times daily.    . DiphenhydrAMINE HCl (BENADRYL ALLERGY PO) Take by mouth as needed.     Marland Kitchen levocetirizine (XYZAL) 5 MG tablet take 1 tablet by mouth every evening 30 tablet 5  . metoprolol succinate (TOPROL-XL) 50 MG 24 hr tablet take 1 tablet by mouth once daily 90 tablet 1  . mometasone (ELOCON) 0.1 % cream as needed.    . Multiple Vitamins-Calcium (ONE-A-DAY  WOMENS PO) Take 1 each by mouth daily.       No current facility-administered medications on file prior to visit.     Past Medical History:  Diagnosis Date  . Amaurosis fugax    x 3 ; resolved with BP meds  . Colitis, ischemic ([place]) 2010   Dr. Earlean Shawl  . Irritable bowel syndrome    Dr Earlean Shawl, GI  . Milk intolerance   . Nonspecific elevation of levels of transaminase or lactic acid dehydrogenase (LDH)   . Other and unspecified hyperlipidemia    [place] study LDL goal=<130; NMR LDL  goal = < 160, ideally < 130  . Other and unspecified ovarian cyst    PMHx  . Polydipsia   . Rosacea   . Sebaceous cyst     left buttock  . Unspecified essential hypertension     Past Surgical History:  Procedure Laterality Date  . APPENDECTOMY  1980  . CESAREAN SECTION     x 2; G2 P2  . COLONOSCOPY  2003, 2008   due 2018; Dr Earlean Shawl  . Peri ovarian cyst right resected  1980  . TONSILLECTOMY AND ADENOIDECTOMY    . UPPER GASTROINTESTINAL ENDOSCOPY  1986    Social History   Socioeconomic History  . Marital status: Divorced    Spouse name: Not on file  . Number of children: Not on file  . Years of education: Not on file  . Highest education  level: Not on file  Occupational History  . Occupation: Pharmacist, hospital  Social Needs  . Financial resource strain: Not on file  . Food insecurity:    Worry: Not on file    Inability: Not on file  . Transportation needs:    Medical: Not on file    Non-medical: Not on file  Tobacco Use  . Smoking status: Former Smoker    Last attempt to quit: 01/04/1969    Years since quitting: 48.2  . Smokeless tobacco: Never Used  . Tobacco comment: smoked [place], up to 2 ppd  Substance and Sexual Activity  . Alcohol use: No    Comment:  very rarely, < 1 glass of wine/ month  . Drug use: No  . Sexual activity: Not on file  Lifestyle  . Physical activity:    Days per week: Not on file    Minutes per session: Not on file  . Stress: Not on file  Relationships    . Social connections:    Talks on phone: Not on file    Gets together: Not on file    Attends religious service: Not on file    Active member of club or organization: Not on file    Attends meetings of clubs or organizations: Not on file    Relationship status: Not on file  Other Topics Concern  . 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 Diabetes Father   . Dementia Father   . Heart attack Father 41  . Heart disease Father   . Basal cell carcinoma Mother   . Hypertension Mother 39       Angioplasty  . Lung cancer Mother        Small cell  . Heart attack Mother 81  . Cancer Mother        lung and skin (basal cell carcinoma)  . Diabetes Sister   . Diabetes Maternal Grandfather   . Heart attack Maternal Aunt         X 2;ages 56  & 65  . Lung cancer Maternal Aunt        smoker  . Stroke Paternal Grandfather   . Dementia Paternal Grandfather   . Heart failure Maternal Aunt 62  . Obesity Sister        x 2  . Thyroid nodules Sister   . Prostate cancer Unknown        nephew  . Other Unknown        PTH nodule, nephew  . Diabetes Paternal Grandmother     Review of Systems  Constitutional: Negative for chills, fatigue and fever.  HENT: Positive for congestion (one side).   Eyes: Negative for visual disturbance.  Respiratory: Negative for cough, shortness of breath and wheezing.   Cardiovascular: Negative for chest pain, palpitations and leg swelling.  Gastrointestinal: Negative for abdominal pain, blood in stool, constipation, diarrhea and nausea.       No gerd  Genitourinary: Negative for dysuria and hematuria.  Musculoskeletal: Negative for arthralgias and back pain.  Skin: Negative for color change and rash.  Neurological: Positive for dizziness (occ), light-headedness (occ) and headaches (occasionally).  Psychiatric/Behavioral: Negative for dysphoric  mood (transient only). The patient is not nervous/anxious.        Objective:   Vitals:   03/24/17 2703  BP: 126/72  Pulse: 63  Resp: 16  Temp: 98.1 F (36.7 C)  SpO2: 98%   Filed Weights   03/24/17 0921  Weight: 168 lb (76.2 kg)   Body mass index is 30.73 kg/m.  Wt Readings from Last 3 Encounters:  03/24/17 168 lb (76.2 kg)  12/15/16 171 lb (77.6 kg)  06/04/16 166 lb (75.3 kg)     Physical Exam Constitutional: She appears well-developed and well-nourished. No distress.  HENT:  Head: Normocephalic and atraumatic.  Right Ear: External ear normal. Normal ear canal and TM Left Ear: External ear normal.  Normal ear canal and TM Mouth/Throat: Oropharynx is clear and moist.  Eyes: Conjunctivae and EOM are normal.  Neck: Neck supple. No tracheal deviation present. No thyromegaly present.  No carotid bruit  Cardiovascular: Normal rate, regular rhythm and normal heart sounds.   No murmur heard.  No edema. Pulmonary/Chest: Effort normal and breath sounds normal. No respiratory distress. She has no wheezes. She has no rales.  Breast: deferred to Gyn Abdominal: Soft. She exhibits no distension. There is no tenderness.  Lymphadenopathy: She has no cervical adenopathy.  Skin: Skin is warm and dry. She is not diaphoretic.  Psychiatric: She has a normal mood and affect. Her behavior is normal.        Assessment & Plan:   Physical exam: Screening blood work    ordered Immunizations  prevnar declined, declined shingles Colonoscopy - none --- cologuard done 2018 Mammogram   Up to date  Gyn  - scheduled Dexa      - declined Eye exams  Up to date - Dr Katy Fitch EKG       Last done 03/2016 Exercise  Swimming, walking - goal 140 by October 15 Weight   Working on weight loss Skin  Dr Ronnald Ramp, no concerns Substance abuse  none  See Problem List for Assessment and Plan of chronic medical problems.   FU in one year

## 2017-03-23 NOTE — Patient Instructions (Addendum)
Test(s) ordered today. Your results will be released to [facility] (or called to you) after review, usually within 72hours after test completion. If any changes need to be made, you will be notified at that same time.  All other Health Maintenance issues reviewed.   All recommended immunizations and age-appropriate screenings are up-to-date or discussed.  No immunizations administered today.   Medications reviewed and updated.  No changes recommended at this time.   Please followup in one year   Health Maintenance, Female Adopting a healthy lifestyle and getting preventive care can go a long way to promote health and wellness. Talk with your health care provider about what schedule of regular examinations is right for you. This is a good chance for you to check in with your provider about disease prevention and staying healthy. In between checkups, there are plenty of things you can do on your own. Experts have done a lot of research about which lifestyle changes and preventive measures are most likely to keep you healthy. Ask your health care provider for more information. Weight and diet Eat a healthy diet  Be sure to include plenty of vegetables, fruits, low-fat dairy products, and lean protein.  Do not eat a lot of foods high in solid fats, added sugars, or salt.  Get regular exercise. This is one of the most important things you can do for your health. ? Most adults should exercise for at least 150 minutes each week. The exercise should increase your heart rate and make you sweat (moderate-intensity exercise). ? Most adults should also do strengthening exercises at least twice a week. This is in addition to the moderate-intensity exercise.  Maintain a healthy weight  Body mass index (BMI) is a measurement that can be used to identify possible weight problems. It estimates body fat based on height and weight. Your health care provider can help determine your BMI and help you achieve or  maintain a healthy weight.  For females 64 years of age and older: ? A BMI below 18.5 is considered underweight. ? A BMI of 18.5 to 24.9 is normal. ? A BMI of 25 to 29.9 is considered overweight. ? A BMI of 30 and above is considered obese.  Watch levels of cholesterol and blood lipids  You should start having your blood tested for lipids and cholesterol at 70 years of age, then have this test every 5 years.  You may need to have your cholesterol levels checked more often if: ? Your lipid or cholesterol levels are high. ? You are older than 70 years of age. ? You are at high risk for heart disease.  Cancer screening Lung Cancer  Lung cancer screening is recommended for adults 72-7 years old who are at high risk for lung cancer because of a history of smoking.  A yearly low-dose CT scan of the lungs is recommended for people who: ? Currently smoke. ? Have quit within the past 15 years. ? Have at least a 30-pack-year history of smoking. A pack year is smoking an average of one pack of cigarettes a day for 1 year.  Yearly screening should continue until it has been 15 years since you quit.  Yearly screening should stop if you develop a health problem that would prevent you from having lung cancer treatment.  Breast Cancer  Practice breast self-awareness. This means understanding how your breasts normally appear and feel.  It also means doing regular breast self-exams. Let your health care provider know about any changes,  no matter how small.  If you are in your 20s or 30s, you should have a clinical breast exam (CBE) by a health care provider every 1-3 years as part of a regular health exam.  If you are 40 or older, have a CBE every year. Also consider having a breast X-ray (mammogram) every year.  If you have a family history of breast cancer, talk to your health care provider about genetic screening.  If you are at high risk for breast cancer, talk to your health care  provider about having an MRI and a mammogram every year.  Breast cancer gene (BRCA) assessment is recommended for women who have family members with BRCA-related cancers. BRCA-related cancers include: ? Breast. ? Ovarian. ? Tubal. ? Peritoneal cancers.  Results of the assessment will determine the need for genetic counseling and BRCA1 and BRCA2 testing.  Cervical Cancer Your health care provider may recommend that you be screened regularly for cancer of the pelvic organs (ovaries, uterus, and vagina). This screening involves a pelvic examination, including checking for microscopic changes to the surface of your cervix (Pap test). You may be encouraged to have this screening done every 3 years, beginning at age 21.  For women ages 30-65, health care providers may recommend pelvic exams and Pap testing every 3 years, or they may recommend the Pap and pelvic exam, combined with testing for human papilloma virus (HPV), every 5 years. Some types of HPV increase your risk of cervical cancer. Testing for HPV may also be done on women of any age with unclear Pap test results.  Other health care providers may not recommend any screening for nonpregnant women who are considered low risk for pelvic cancer and who do not have symptoms. Ask your health care provider if a screening pelvic exam is right for you.  If you have had past treatment for cervical cancer or a condition that could lead to cancer, you need Pap tests and screening for cancer for at least 20 years after your treatment. If Pap tests have been discontinued, your risk factors (such as having a new sexual partner) need to be reassessed to determine if screening should resume. Some women have medical problems that increase the chance of getting cervical cancer. In these cases, your health care provider may recommend more frequent screening and Pap tests.  Colorectal Cancer  This type of cancer can be detected and often prevented.  Routine  colorectal cancer screening usually begins at 70 years of age and continues through 70 years of age.  Your health care provider may recommend screening at an earlier age if you have risk factors for colon cancer.  Your health care provider may also recommend using home test kits to check for hidden blood in the stool.  A small camera at the end of a tube can be used to examine your colon directly (sigmoidoscopy or colonoscopy). This is done to check for the earliest forms of colorectal cancer.  Routine screening usually begins at age 50.  Direct examination of the colon should be repeated every 5-10 years through 70 years of age. However, you may need to be screened more often if early forms of precancerous polyps or small growths are found.  Skin Cancer  Check your skin from head to toe regularly.  Tell your health care provider about any new moles or changes in moles, especially if there is a change in a mole's shape or color.  Also tell your health care provider if you   have a mole that is larger than the size of a pencil eraser.  Always use sunscreen. Apply sunscreen liberally and repeatedly throughout the day.  Protect yourself by wearing long sleeves, pants, a wide-brimmed hat, and sunglasses whenever you are outside.  Heart disease, diabetes, and high blood pressure  High blood pressure causes heart disease and increases the risk of stroke. High blood pressure is more likely to develop in: ? People who have blood pressure in the high end of the normal range (130-139/85-89 mm Hg). ? People who are overweight or obese. ? People who are African American.  If you are 24-25 years of age, have your blood pressure checked every 3-5 years. If you are 2 years of age or older, have your blood pressure checked every year. You should have your blood pressure measured twice-once when you are at a hospital or clinic, and once when you are not at a hospital or clinic. Record the average of the  two measurements. To check your blood pressure when you are not at a hospital or clinic, you can use: ? An automated blood pressure machine at a pharmacy. ? A home blood pressure monitor.  If you are between 42 years and 59 years old, ask your health care provider if you should take aspirin to prevent strokes.  Have regular diabetes screenings. This involves taking a blood sample to check your fasting blood sugar level. ? If you are at a normal weight and have a low risk for diabetes, have this test once every three years after 70 years of age. ? If you are overweight and have a high risk for diabetes, consider being tested at a younger age or more often. Preventing infection Hepatitis B  If you have a higher risk for hepatitis B, you should be screened for this virus. You are considered at high risk for hepatitis B if: ? You were born in a country where hepatitis B is common. Ask your health care provider which countries are considered high risk. ? Your parents were born in a high-risk country, and you have not been immunized against hepatitis B (hepatitis B vaccine). ? You have HIV or AIDS. ? You use needles to inject street drugs. ? You live with someone who has hepatitis B. ? You have had sex with someone who has hepatitis B. ? You get hemodialysis treatment. ? You take certain medicines for conditions, including cancer, organ transplantation, and autoimmune conditions.  Hepatitis C  Blood testing is recommended for: ? Everyone born from [birth year] through 1963. ? Anyone with known risk factors for hepatitis C.  Sexually transmitted infections (STIs)  You should be screened for sexually transmitted infections (STIs) including gonorrhea and chlamydia if: ? You are sexually active and are younger than 70 years of age. ? You are older than 70 years of age and your health care provider tells you that you are at risk for this type of infection. ? Your sexual activity has changed since you  were last screened and you are at an increased risk for chlamydia or gonorrhea. Ask your health care provider if you are at risk.  If you do not have HIV, but are at risk, it may be recommended that you take a prescription medicine daily to prevent HIV infection. This is called pre-exposure prophylaxis (PrEP). You are considered at risk if: ? You are sexually active and do not regularly use condoms or know the HIV status of your partner(s). ? You take drugs by injection. ?  You are sexually active with a partner who has HIV.  Talk with your health care provider about whether you are at high risk of being infected with HIV. If you choose to begin PrEP, you should first be tested for HIV. You should then be tested every 3 months for as long as you are taking PrEP. Pregnancy  If you are premenopausal and you may become pregnant, ask your health care provider about preconception counseling.  If you may become pregnant, take 400 to 800 micrograms (mcg) of folic acid every day.  If you want to prevent pregnancy, talk to your health care provider about birth control (contraception). Osteoporosis and menopause  Osteoporosis is a disease in which the bones lose minerals and strength with aging. This can result in serious bone fractures. Your risk for osteoporosis can be identified using a bone density scan.  If you are 65 years of age or older, or if you are at risk for osteoporosis and fractures, ask your health care provider if you should be screened.  Ask your health care provider whether you should take a calcium or vitamin D supplement to lower your risk for osteoporosis.  Menopause may have certain physical symptoms and risks.  Hormone replacement therapy may reduce some of these symptoms and risks. Talk to your health care provider about whether hormone replacement therapy is right for you. Follow these instructions at home:  Schedule regular health, dental, and eye exams.  Stay current  with your immunizations.  Do not use any tobacco products including cigarettes, chewing tobacco, or electronic cigarettes.  If you are pregnant, do not drink alcohol.  If you are breastfeeding, limit how much and how often you drink alcohol.  Limit alcohol intake to no more than 1 drink per day for nonpregnant women. One drink equals 12 ounces of beer, 5 ounces of wine, or 1 ounces of hard liquor.  Do not use street drugs.  Do not share needles.  Ask your health care provider for help if you need support or information about quitting drugs.  Tell your health care provider if you often feel depressed.  Tell your health care provider if you have ever been abused or do not feel safe at home. This information is not intended to replace advice given to you by your health care provider. Make sure you discuss any questions you have with your health care provider. Document Released: 07/06/2010 Document Revised: 05/29/2015 Document Reviewed: 09/24/2014 Elsevier Interactive Patient Education  2018 [employer]  

## 2017-03-24 ENCOUNTER — Encounter: Payer: Self-pay | Admitting: Internal Medicine

## 2017-03-24 ENCOUNTER — Ambulatory Visit (INDEPENDENT_AMBULATORY_CARE_PROVIDER_SITE_OTHER): Payer: Medicare Other | Admitting: Internal Medicine

## 2017-03-24 ENCOUNTER — Other Ambulatory Visit (INDEPENDENT_AMBULATORY_CARE_PROVIDER_SITE_OTHER): Payer: Medicare Other

## 2017-03-24 VITALS — BP 126/72 | HR 63 | Temp 98.1°F | Resp 16 | Ht 62.0 in | Wt 168.0 lb

## 2017-03-24 DIAGNOSIS — E7849 Other hyperlipidemia: Secondary | ICD-10-CM | POA: Diagnosis not present

## 2017-03-24 DIAGNOSIS — Z0000 Encounter for general adult medical examination without abnormal findings: Secondary | ICD-10-CM

## 2017-03-24 DIAGNOSIS — I10 Essential (primary) hypertension: Secondary | ICD-10-CM

## 2017-03-24 LAB — CBC WITH DIFFERENTIAL/PLATELET
Basophils Absolute: 0.1 10*3/uL (ref 0.0–0.1)
Basophils Relative: 1.1 % (ref 0.0–3.0)
EOS PCT: 3.8 % (ref 0.0–5.0)
Eosinophils Absolute: 0.2 10*3/uL (ref 0.0–0.7)
HCT: 40.2 % (ref 36.0–46.0)
Hemoglobin: 13.9 g/dL (ref 12.0–15.0)
LYMPHS ABS: 1.7 10*3/uL (ref 0.7–4.0)
Lymphocytes Relative: 28.7 % (ref 12.0–46.0)
MCHC: 34.5 g/dL (ref 30.0–36.0)
MCV: 88.9 fl (ref 78.0–100.0)
MONO ABS: 0.6 10*3/uL (ref 0.1–1.0)
Monocytes Relative: 9.9 % (ref 3.0–12.0)
NEUTROS ABS: 3.3 10*3/uL (ref 1.4–7.7)
NEUTROS PCT: 56.5 % (ref 43.0–77.0)
PLATELETS: 231 10*3/uL (ref 150.0–400.0)
RBC: 4.53 Mil/uL (ref 3.87–5.11)
RDW: 12.8 % (ref 11.5–15.5)
WBC: 5.8 10*3/uL (ref 4.0–10.5)

## 2017-03-24 LAB — COMPREHENSIVE METABOLIC PANEL
ALT: 19 U/L (ref 0–35)
AST: 19 U/L (ref 0–37)
Albumin: 4.3 g/dL (ref 3.5–5.2)
Alkaline Phosphatase: 68 U/L (ref 39–117)
BUN: 14 mg/dL (ref 6–23)
CO2: 26 mEq/L (ref 19–32)
Calcium: 9.7 mg/dL (ref 8.4–10.5)
Chloride: 107 mEq/L (ref 96–112)
Creatinine, Ser: 0.81 mg/dL (ref 0.40–1.20)
GFR: 74.43 mL/min (ref >60.00)
GLUCOSE: 104 mg/dL — AB (ref 70–99)
POTASSIUM: 4.1 meq/L (ref 3.5–5.1)
SODIUM: 142 meq/L (ref 135–145)
Total Bilirubin: 0.3 mg/dL (ref 0.2–1.2)
Total Protein: 6.9 g/dL (ref 6.0–8.3)

## 2017-03-24 LAB — LIPID PANEL
Cholesterol: 200 mg/dL (ref 0–200)
HDL: 56.3 mg/dL (ref >39.00)
LDL Cholesterol: 120 mg/dL — ABNORMAL HIGH (ref 0–99)
NONHDL: 143.48
Total CHOL/HDL Ratio: 4
Triglycerides: 118 mg/dL (ref 0.0–149.0)
VLDL: 23.6 mg/dL (ref 0.0–40.0)

## 2017-03-24 LAB — HEMOGLOBIN A1C: HEMOGLOBIN A1C: 5.5 % (ref 4.6–6.5)

## 2017-03-24 LAB — TSH: TSH: 3.22 u[IU]/mL (ref 0.35–4.50)

## 2017-03-24 MED ORDER — METOPROLOL SUCCINATE ER 50 MG PO TB24: 50.0000 mg | ORAL_TABLET | Freq: Every day | ORAL | 1 refills | Status: DC

## 2017-03-24 NOTE — Assessment & Plan Note (Signed)
BP well controlled Current regimen effective  - she does have occasional nightmares and thinks it may be from the metoprolol - she did try to stop it, but her BP spiked and she restarted it -- the nightmares are rare Continue current medications at current doses Cmp, cbc, tsh

## 2017-03-24 NOTE — Assessment & Plan Note (Signed)
Check lipid panel, tsh Has not tolerated statins Regular exercise and healthy diet encouraged

## 2017-05-09 ENCOUNTER — Ambulatory Visit: Payer: Medicare Other | Admitting: Internal Medicine

## 2017-05-09 ENCOUNTER — Encounter: Payer: Self-pay | Admitting: Internal Medicine

## 2017-05-09 VITALS — BP 120/84 | HR 71 | Temp 98.2°F | Resp 16 | Wt 164.0 lb

## 2017-05-09 DIAGNOSIS — J0100 Acute maxillary sinusitis, unspecified: Secondary | ICD-10-CM | POA: Diagnosis not present

## 2017-05-09 DIAGNOSIS — R599 Enlarged lymph nodes, unspecified: Secondary | ICD-10-CM | POA: Diagnosis not present

## 2017-05-09 MED ORDER — DEXAMETHASONE 6 MG PO TABS: 6.0000 mg | ORAL_TABLET | Freq: Every day | ORAL | 0 refills | Status: DC

## 2017-05-09 MED ORDER — AMOXICILLIN 500 MG PO CAPS: 500.0000 mg | ORAL_CAPSULE | Freq: Three times a day (TID) | ORAL | 0 refills | Status: DC

## 2017-05-09 NOTE — Assessment & Plan Note (Signed)
Swollen lymph node or salivary gland left anterior neck x3 days Improved slightly with warm compresses-advised her to continue Will be starting an antibiotic for probable sinus infection and expect this to improve If it does not we will consider imaging Continue increased water

## 2017-05-09 NOTE — Progress Notes (Signed)
Subjective:    Patient ID: Kimberly Stephenson, female    DOB: 1945-11-03, 72 y.o.   MRN: [id number]  HPI The patient is here for an acute visit.   She developed a soreness in the left anterior side of her neck three days ago.  It was very small and has gotten larger - about 2 inches in size yesterday.  She has been applying a hot water bottle on it.  It does help soothe the pain.  She has been taking advil.  She thinks it may be slightly smaller than yesterday.  She was not sure if it was a salivary gland or lymph node.  She has seen the dentist and there were no problems at her last visit, which was not that long ago.   She had a sinus infection in the left side since January.  She tends to have chronic sinus infections.  Yesterday the left side of her throat started to get sore.  She did check her temperature early this morning and it was very low.  She has chronic nasal congestion, postnasal drip and has sinus pain on the left side.  She is a chronic cough that is sometimes productive.  She has occasional wheezing and did hear some wheezing recently.  She does have headaches because of her allergies and sinus issues.  She denies any ear pain, shortness of breath and chest tightness.  Taking xyzal and bendaryl.  Taking sudafed prn.    Medications and allergies reviewed with patient and updated if appropriate.  Patient Active Problem List   Diagnosis Date Noted  . Hearing loss of right ear 12/15/2016  . Tinnitus 06/04/2016  . Chronic sinusitis 03/23/2016  . Pain of right hip joint 03/23/2016  . Palpitations 03/23/2016  . Vitamin D deficiency 06/08/2015  . 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 NONSPECIFIC ABNORMAL ELECTROCARDIOGRAM 07/19/2008  . Hyperlipemia 12/13/2006  . AMAUROSIS FUGAX 11/30/2006  . Essential hypertension 11/30/2006  . Irritable bowel syndrome 11/30/2006  . OVARIAN CYST 11/30/2006  . Rosacea 11/30/2006  . NONSPEC ELEVATION OF LEVELS OF  TRANSAMINASE/LDH 11/30/2006    Current Outpatient Medications on File Prior to Visit  Medication Sig Dispense Refill  . aspirin-acetaminophen-caffeine (EXCEDRIN MIGRAINE) 250-250-65 MG tablet Take 1 tablet by mouth daily. Reported on 04/08/2015    . Clindamycin-Benzoyl Per, Refr, (DUAC) gel Apply topically 2 (two) times daily.    . DiphenhydrAMINE HCl (BENADRYL ALLERGY PO) Take by mouth as needed.     Marland Kitchen levocetirizine (XYZAL) 5 MG tablet take 1 tablet by mouth every evening 30 tablet 5  . metoprolol succinate (TOPROL-XL) 50 MG 24 hr tablet Take 1 tablet (50 mg total) by mouth daily. Take with or immediately following a meal. 90 tablet 1  . mometasone (ELOCON) 0.1 % cream as needed.    . Multiple Vitamins-Calcium (ONE-A-DAY WOMENS PO) Take 1 each by mouth daily.       No current facility-administered medications on file prior to visit.     Past Medical History:  Diagnosis Date  . Amaurosis fugax    x 3 ; resolved with BP meds  . Colitis, ischemic ([place]) 2010   Dr. Earlean Shawl  . Irritable bowel syndrome    Dr Earlean Shawl, GI  . Milk intolerance   . Nonspecific elevation of levels of transaminase or lactic acid dehydrogenase (LDH)   . Other and unspecified hyperlipidemia    [place] study LDL goal=<130; NMR LDL  goal = < 160, ideally <  130  . Other and unspecified ovarian cyst    PMHx  . Polydipsia   . Rosacea   . Sebaceous cyst     left buttock  . Unspecified essential hypertension     Past Surgical History:  Procedure Laterality Date  . APPENDECTOMY  1980  . CESAREAN SECTION     x 2; G2 P2  . COLONOSCOPY  2003, 2008   due 2018; Dr Earlean Shawl  . Peri ovarian cyst right resected  1980  . TONSILLECTOMY AND ADENOIDECTOMY    . UPPER GASTROINTESTINAL ENDOSCOPY  1986    Social History   Socioeconomic History  . Marital status: Divorced    Spouse name: Not on file  . Number of children: Not on file  . Years of education: Not on file  . Highest education level: Not on file    Occupational History  . Occupation: Pharmacist, hospital  Social Needs  . Financial resource strain: Not on file  . Food insecurity:    Worry: Not on file    Inability: Not on file  . Transportation needs:    Medical: Not on file    Non-medical: Not on file  Tobacco Use  . Smoking status: Former Smoker    Last attempt to quit: 01/04/1969    Years since quitting: 48.3  . Smokeless tobacco: Never Used  . Tobacco comment: smoked [place], up to 2 ppd  Substance and Sexual Activity  . Alcohol use: No    Comment:  very rarely, < 1 glass of wine/ month  . Drug use: No  . Sexual activity: Not on file  Lifestyle  . Physical activity:    Days per week: Not on file    Minutes per session: Not on file  . Stress: Not on file  Relationships  . Social connections:    Talks on phone: Not on file    Gets together: Not on file    Attends religious service: Not on file    Active member of club or organization: Not on file    Attends meetings of clubs or organizations: Not on file    Relationship status: Not on file  Other Topics Concern  . 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 Diabetes Father   . Dementia Father   . Heart attack Father 60  . Heart disease Father   . Basal cell carcinoma Mother   . Hypertension Mother 13       Angioplasty  . Lung cancer Mother        Small cell  . Heart attack Mother 60  . Cancer Mother        lung and skin (basal cell carcinoma)  . Diabetes Sister   . Diabetes Maternal Grandfather   . Heart attack Maternal Aunt         X 2;ages 56  & 65  . Lung cancer Maternal Aunt        smoker  . Stroke Paternal Grandfather   . Dementia Paternal Grandfather   . Heart failure Maternal Aunt 62  . Obesity Sister        x 2  . Thyroid nodules Sister   . Prostate cancer Unknown        nephew  . Other Unknown        PTH nodule,  nephew  . Diabetes  Paternal Grandmother     Review of Systems  Constitutional: Positive for fever (low temperature this morning - 96.8). Negative for chills.  HENT: Positive for congestion (chronic), postnasal drip, sinus pain (left only) and sore throat. Negative for ear pain and sinus pressure.        Swollen tender lump in left anterior neck  Respiratory: Positive for cough (chronic  - occ productive) and wheezing (once). Negative for chest tightness and shortness of breath.   Neurological: Positive for headaches.       Objective:   Vitals:   05/09/17 1048  BP: 120/84  Pulse: 71  Resp: 16  Temp: 98.2 F (36.8 C)  SpO2: 98%   BP Readings from Last 3 Encounters:  05/09/17 120/84  03/24/17 126/72  12/15/16 138/84   Wt Readings from Last 3 Encounters:  05/09/17 164 lb (74.4 kg)  03/24/17 168 lb (76.2 kg)  12/15/16 171 lb (77.6 kg)   Body mass index is 30 kg/m.   Physical Exam    GENERAL APPEARANCE: Appears stated age, well appearing, NAD EYES: conjunctiva clear, no icterus HEENT: bilateral tympanic membranes and ear canals normal with minimal cerumen in the right ear canal, oropharynx with moderate erythema, no thyromegaly, trachea midline, left anterior cervical lymphadenopathy or tender salivary gland, no other concerning lymphadenopathy in neck LUNGS: Clear to auscultation without wheeze or crackles, unlabored breathing, good air entry bilaterally CARDIOVASCULAR: Normal S1,S2 without murmurs, no edema SKIN: Warm, dry      Assessment & Plan:    See Problem List for Assessment and Plan of chronic medical problems.

## 2017-05-09 NOTE — Assessment & Plan Note (Signed)
Subacute or chronic maxillary sinusitis on the left Start amoxicillin 500 mg 3 times daily x10 days Steroids-dexamethasone 6 mg daily x3 days Continue allergy medications Call if no improvement

## 2017-05-09 NOTE — Patient Instructions (Addendum)
Take the amoxicillin and steroid as prescribed.   These were sent to your pharmacy.     Continue the warm compresses and increased water intake.   Call if no improvement

## 2017-05-10 ENCOUNTER — Other Ambulatory Visit: Payer: Self-pay | Admitting: Internal Medicine

## 2017-05-10 MED ORDER — DEXAMETHASONE 6 MG PO TABS: 6.0000 mg | ORAL_TABLET | Freq: Every day | ORAL | 0 refills | Status: DC

## 2017-05-20 ENCOUNTER — Telehealth: Payer: Self-pay | Admitting: Emergency Medicine

## 2017-05-20 NOTE — Telephone Encounter (Signed)
Called patient to schedule AWV. Patient declined at this time. 

## 2017-06-13 ENCOUNTER — Other Ambulatory Visit: Payer: Self-pay | Admitting: Internal Medicine

## 2017-08-02 ENCOUNTER — Other Ambulatory Visit: Payer: Self-pay | Admitting: Internal Medicine

## 2017-08-02 ENCOUNTER — Telehealth: Payer: Self-pay | Admitting: Internal Medicine

## 2017-08-02 NOTE — Telephone Encounter (Signed)
Medication filled on 7/30

## 2017-08-02 NOTE — Telephone Encounter (Signed)
Copied from [place] [id number]. Topic: Quick Communication - Rx Refill/Question >> Aug 02, 2017 10:02 AM Bea Graff, NT wrote: Medication: levocetirizine (XYZAL) 5 MG tablet   Has the patient contacted their pharmacy? Yes.   (Agent: If no, request that the patient contact the pharmacy for the refill.) (Agent: If yes, when and what did the pharmacy advise?)  Preferred Pharmacy (with phone number or street name): [facility] [place], [place] AT [facility] [address] [place] [phone] (Phone) [phone] (Fax)      Agent: Please be advised that RX refills may take up to 3 business days. We ask that you follow-up with your pharmacy.

## 2017-09-27 ENCOUNTER — Other Ambulatory Visit: Payer: Self-pay | Admitting: Internal Medicine

## 2017-12-20 ENCOUNTER — Other Ambulatory Visit: Payer: Self-pay | Admitting: Internal Medicine

## 2017-12-26 ENCOUNTER — Encounter: Payer: Self-pay | Admitting: Internal Medicine

## 2017-12-26 ENCOUNTER — Telehealth: Payer: Self-pay

## 2017-12-26 ENCOUNTER — Ambulatory Visit (INDEPENDENT_AMBULATORY_CARE_PROVIDER_SITE_OTHER): Payer: Medicare Other | Admitting: Internal Medicine

## 2017-12-26 DIAGNOSIS — J0100 Acute maxillary sinusitis, unspecified: Secondary | ICD-10-CM

## 2017-12-26 MED ORDER — AMOXICILLIN-POT CLAVULANATE 875-125 MG PO TABS: 1.0000 | ORAL_TABLET | Freq: Two times a day (BID) | ORAL | 0 refills | Status: DC

## 2017-12-26 MED ORDER — DEXAMETHASONE 6 MG PO TABS: 6.0000 mg | ORAL_TABLET | Freq: Every day | ORAL | 0 refills | Status: DC

## 2017-12-26 NOTE — Telephone Encounter (Signed)
Copied from [place] [id number]. Topic: General - Other >> Dec 26, 2017  3:42 PM Mcneil, Ja-Kwan wrote: Reason for CRM: Pt stated she was prescribed amoxicillin-clavulanate (AUGMENTIN) 875-125 MG tablet but she does not want the clavulanate. Pt stated she only wants to take the amoxicillin. Pt requests that the new prescription be sent to her pharmacy. Pt also requested a call back. Cb# [phone]

## 2017-12-26 NOTE — Telephone Encounter (Signed)
This is the appropriate treatment for her problem and the clavulanate is to help the amoxicillin to work better. She should take this.

## 2017-12-26 NOTE — Patient Instructions (Signed)
We have sent in augmentin for the sinus infection to take 1 pill twice a day for 10 days.   We have also sent in dexamethasone to take 1 pill daily for 5 days.

## 2017-12-26 NOTE — Telephone Encounter (Signed)
Patient informed of MD response and stated understanding  

## 2017-12-26 NOTE — Progress Notes (Signed)
   Subjective:   Patient ID: Kimberly Stephenson, female    DOB: [birth year], 72 y.o.   MRN: [id number]  HPI The patient is a 72 YO female coming in for acute on chronic sinus problems. She denies cough or fevers or chills. She is having nose drainage and bloody drainage. She uses sinus rinses. Had been using flonase some. Started about 2 weeks ago. Some sinus pressure which is about usual. Denies ear pain but some pressure. Overall worsening. She has this at least once per year and past history of sinus surgery.   Review of Systems  Constitutional: Positive for activity change, appetite change and fatigue. Negative for chills, fever and unexpected weight change.  HENT: Positive for congestion, nosebleeds, postnasal drip, rhinorrhea and sinus pressure. Negative for ear discharge, ear pain, sinus pain, sneezing, sore throat, tinnitus, trouble swallowing and voice change.   Eyes: Negative.   Respiratory: Negative for cough, chest tightness, shortness of breath and wheezing.   Cardiovascular: Negative.   Gastrointestinal: Negative.   Musculoskeletal: Negative.   Neurological: Negative.     Objective:  Physical Exam Constitutional:      Appearance: She is well-developed.  HENT:     Head: Normocephalic and atraumatic.     Comments: Oropharynx with redness and clear drainage, nose with swollen turbinates and open sore on the left septum, TMs normal bilaterally. Sinus pressure frontal.  Neck:     Musculoskeletal: Normal range of motion.     Thyroid: No thyromegaly.  Cardiovascular:     Rate and Rhythm: Normal rate and regular rhythm.  Pulmonary:     Effort: Pulmonary effort is normal. No respiratory distress.     Breath sounds: Normal breath sounds. No wheezing or rales.  Abdominal:     Palpations: Abdomen is soft.  Musculoskeletal:        General: Tenderness present.  Lymphadenopathy:     Cervical: No cervical adenopathy.  Skin:    General: Skin is warm and dry.  Neurological:   Mental Status: She is alert and oriented to person, place, and time.     Vitals:   12/26/17 1310  BP: 124/70  Pulse: 81  Temp: 98.5 F (36.9 C)  TempSrc: Oral  SpO2: 97%  Weight: 152 lb (68.9 kg)  Height: 5\' 2"  (1.575 m)    Assessment & Plan:

## 2017-12-27 NOTE — Assessment & Plan Note (Signed)
Rx for dexamethasone and augmentin for infection.

## 2018-03-16 ENCOUNTER — Other Ambulatory Visit: Payer: Self-pay | Admitting: Internal Medicine

## 2018-03-28 ENCOUNTER — Ambulatory Visit: Payer: Self-pay | Admitting: *Deleted

## 2018-03-28 ENCOUNTER — Encounter: Payer: Self-pay | Admitting: Internal Medicine

## 2018-03-28 NOTE — Telephone Encounter (Signed)
Message from Nils Flack sent at 03/28/2018 4:43 PM EDT   Summary: home care advice    Pt knicked toe while clipping toe nail. She woke up next morning and below the toe nail it was red and inflamed. She has been soaking with epsom salt water and using neosporin. It is still red. She wants to know what to do next. She is worries that it is infected and may boil up.           Returned call to patient regarding her toe nail. She stated she was clipping her toe nail on the left foot (bigt toe) and got too close. She stated did not nicked her skin. But the next day she had red streaks along the side of her foot and spread out to the base of toe nail. Now there is a boil on the side of her toe. She denies fever, has been wearing sandals. She is not a diabetic. She has been soaking in epsom salt and using neosporin to the area. Then wrapping it up with a Band-Aid.  The pain has lessen since Sunday was a 9 now a 4.  She will take a picture and send it to the provider in [place]. Routing to [facility] at [facility] for treatment recommendations.   Reason for Disposition . [1] Has diabetes (diabetes mellitus) AND [2] small cut or scrape    Pt is not a diabetic.  Answer Assessment - Initial Assessment Questions 1. MECHANISM: "How did the injury happen?"      Cut her toenail 2. ONSET: "When did the injury happen?" (Minutes or hours ago)      Saturday night 3. LOCATION: "What part of the toe is injured?" "Is the nail damaged?"      Big toe, toe nail  4. APPEARANCE of TOE INJURY: "What does the injury look like?"      Red streaks down the side and has spread from the top of the toe nail to the base 5. SEVERITY: "Can you use the foot normally?" "Can you walk?"      Yes can walk 6. SIZE: For cuts, bruises, or swelling, ask: "How large is it?" (e.g., inches or centimeters;  entire toe)      Can not see the place 7. PAIN: "Is there pain?" If so, ask: "How bad is the pain?"   (e.g., Scale 1-10;  or mild, moderate, severe)     Yes, mild now 8. TETANUS: For any breaks in the skin, ask: "When was the last tetanus booster?"     Not sure, no bleeding 9. DIABETES: "Do you have a history of diabetes or poor circulation in the feet?"     no 10. OTHER SYMPTOMS: "Do you have any other symptoms?"        no 11. PREGNANCY: "Is there any chance you are pregnant?" "When was your last menstrual period?"       n/a  Protocols used: TOE INJURY-A-AH

## 2018-03-28 NOTE — Telephone Encounter (Signed)
Please reach out to her first thing in the morning and see if she can do a virtual video visit.  If not come in.

## 2018-03-29 ENCOUNTER — Ambulatory Visit: Payer: Medicare Other | Admitting: Internal Medicine

## 2018-03-29 ENCOUNTER — Other Ambulatory Visit: Payer: Self-pay

## 2018-03-29 ENCOUNTER — Encounter: Payer: Self-pay | Admitting: Internal Medicine

## 2018-03-29 VITALS — BP 154/88 | HR 76 | Temp 98.7°F | Resp 16 | Ht 62.0 in | Wt 154.8 lb

## 2018-03-29 DIAGNOSIS — L03032 Cellulitis of left toe: Secondary | ICD-10-CM | POA: Diagnosis not present

## 2018-03-29 DIAGNOSIS — I10 Essential (primary) hypertension: Secondary | ICD-10-CM | POA: Diagnosis not present

## 2018-03-29 MED ORDER — AMOXICILLIN-POT CLAVULANATE 875-125 MG PO TABS: 1.0000 | ORAL_TABLET | Freq: Two times a day (BID) | ORAL | 0 refills | Status: DC

## 2018-03-29 NOTE — Patient Instructions (Signed)
Continue the Epsom salt soaks and neosporin.  Take the antibiotic if needed if there is no improvement - Augmentin.    Call or mychart with any questions or concerns.      Paronychia Paronychia is an infection of the skin that surrounds a nail. It usually affects the skin around a fingernail, but it may also occur near a toenail. It often causes pain and swelling around the nail. In some cases, a collection of pus (abscess) can form near or under the nail.  This condition may develop suddenly, or it may develop gradually over a longer period. In most cases, paronychia is not serious, and it will clear up with treatment. What are the causes? This condition may be caused by bacteria or a fungus. These germs can enter the body through an opening in the skin, such as a cut or a hangnail. What increases the risk? This condition is more likely to develop in people who:  Get their hands wet often, such as those who work as Designer, industrial/product, bartenders, or nurses.  Bite their fingernails or suck their thumbs.  Trim their nails very short.  Have hangnails or injured fingertips.  Get manicures.  Have diabetes. What are the signs or symptoms? Symptoms of this condition include:  Redness and swelling of the skin near the nail.  Tenderness around the nail when you touch the area.  Pus-filled bumps under the skin at the base and sides of the nail (cuticle).  Fluid or pus under the nail.  Throbbing pain in the area. How is this diagnosed? This condition is diagnosed with a physical exam. In some cases, a sample of pus may be tested to determine what type of bacteria or fungus is causing the condition. How is this treated? Treatment depends on the cause and severity of your condition. If your condition is mild, it may clear up on its own in a few days or after soaking in warm water. If needed, treatment may include:  Antibiotic medicine, if your infection is caused by bacteria.  Antifungal  medicine, if your infection is caused by a fungus.  A procedure to drain pus from an abscess.  Anti-inflammatory medicine (corticosteroids). Follow these instructions at home: Wound care  Keep the affected area clean.  Soak the affected area in warm water, if told to do so by your health care provider. You may be told to do this for 20 minutes, 2-3 times a day.  Keep the area dry when you are not soaking it.  Do not try to drain an abscess yourself.  Follow instructions from your health care provider about how to take care of the affected area. Make sure you: ? Wash your hands with soap and water before you change your bandage (dressing). If soap and water are not available, use hand sanitizer. ? Change your dressing as told by your health care provider.  If you had an abscess drained, check the area every day for signs of infection. Check for: ? Redness, swelling, or pain. ? Fluid or blood. ? Warmth. ? Pus or a bad smell. Medicines   Take over-the-counter and prescription medicines only as told by your health care provider.  If you were prescribed an antibiotic medicine, take it as told by your health care provider. Do not stop taking the antibiotic even if you start to feel better. General instructions  Avoid contact with harsh chemicals.  Do not pick at the affected area. Prevention  To prevent this condition from happening  again: ? Wear rubber gloves when washing dishes or doing other tasks that require your hands to get wet. ? Wear gloves if your hands might come in contact with cleaners or other chemicals. ? Avoid injuring your nails or fingertips. ? Do not bite your nails or tear hangnails. ? Do not cut your nails very short. ? Do not cut your cuticles. ? Use clean nail clippers or scissors when trimming nails. Contact a health care provider if:  Your symptoms get worse or do not improve with treatment.  You have continued or increased fluid, blood, or pus  coming from the affected area.  Your finger or knuckle becomes swollen or difficult to move. Get help right away if you have:  A fever or chills.  Redness spreading away from the affected area.  Joint or muscle pain. Summary  Paronychia is an infection of the skin that surrounds a nail. It often causes pain and swelling around the nail. In some cases, a collection of pus (abscess) can form near or under the nail.  This condition may be caused by bacteria or a fungus. These germs can enter the body through an opening in the skin, such as a cut or a hangnail.  If your condition is mild, it may clear up on its own in a few days. If needed, treatment may include medicine or a procedure to drain pus from an abscess.  To prevent this condition from happening again, wear gloves if doing tasks that require your hands to get wet or to come in contact with chemicals. Also avoid injuring your nails or fingertips. This information is not intended to replace advice given to you by your health care provider. Make sure you discuss any questions you have with your health care provider. Document Released: 06/16/2000 Document Revised: 01/03/2017 Document Reviewed: 01/03/2017 Elsevier Interactive Patient Education  2019 [employer].

## 2018-03-29 NOTE — Progress Notes (Signed)
Subjective:    Patient ID: Kimberly Stephenson, female    DOB: 04-20-1945, 73 y.o.   MRN: [id number]  HPI The patient is here for an acute visit.  4 nights ago she cut her toenails.  Her left first toenail was digging into her skin and she cut it shorter she could not.  The next morning she woke up and an area next to where she cut that nail was red, slightly swollen and painful.  The redness is located at the proximal, medial skin along the toenail and extends toward the distal medial nail.  She has been doing Epson salt soaks 3-4 times a day for 20 minutes and she has been applying Neosporin to the area.  The first couple of days her pain was 8/10 and it is better today.  She states her pain today is 2-3/10.  She has not had any fevers or chills.  There has been no discharge.  She denies any numbness or tingling or tightness in the toe.  Elevated blood pressure: Her blood pressure is elevated here today.  She does monitor her blood pressure at home on a very regular basis and it is well controlled at home.  Her last 2 measures recorded in our system was very good.  On occasion she has had an elevated measure similar to today and she relates this to a spider bite.  She thinks she may have gotten 1 yesterday.  She is taking her medication daily as prescribed.  She does have her blood pressure log with her on her phone and we did review it.    Medications and allergies reviewed with patient and updated if appropriate.  Patient Active Problem List   Diagnosis Date Noted  . Paronychia of great toe of left foot 03/29/2018  . Swollen gland 05/09/2017  . Hearing loss of right ear 12/15/2016  . Tinnitus 06/04/2016  . Chronic sinusitis 03/23/2016  . Pain of right hip joint 03/23/2016  . Palpitations 03/23/2016  . Vitamin D deficiency 06/08/2015  . 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 Subacute maxillary sinusitis 06/14/2014  . NONSPECIFIC ABNORMAL ELECTROCARDIOGRAM  07/19/2008  . Hyperlipemia 12/13/2006  . AMAUROSIS FUGAX 11/30/2006  . Essential hypertension 11/30/2006  . Irritable bowel syndrome 11/30/2006  . OVARIAN CYST 11/30/2006  . Rosacea 11/30/2006  . NONSPEC ELEVATION OF LEVELS OF TRANSAMINASE/LDH 11/30/2006    Current Outpatient Medications on File Prior to Visit  Medication Sig Dispense Refill  . aspirin-acetaminophen-caffeine (EXCEDRIN MIGRAINE) 250-250-65 MG tablet Take 1 tablet by mouth daily. Reported on 04/08/2015    . Clindamycin-Benzoyl Per, Refr, (DUAC) gel Apply topically 2 (two) times daily.    . metoprolol succinate (TOPROL-XL) 50 MG 24 hr tablet TAKE 1 TABLET BY MOUTH EVERY DAY. TAKE WITH OR IMMEDIATELY FOLLOWING A MEAL 90 tablet 0  . mometasone (ELOCON) 0.1 % cream as needed.    . Multiple Vitamins-Calcium (ONE-A-DAY WOMENS PO) Take 1 each by mouth daily.       No current facility-administered medications on file prior to visit.     Past Medical History:  Diagnosis Date  . Amaurosis fugax    x 3 ; resolved with BP meds  . Colitis, ischemic ([place]) 2010   Dr. Earlean Shawl  . Irritable bowel syndrome    Dr Earlean Shawl, GI  . Milk intolerance   . Nonspecific elevation of levels of transaminase or lactic acid dehydrogenase (LDH)   . Other and unspecified hyperlipidemia    [place]  study LDL goal=<130; NMR LDL  goal = < 160, ideally < 130  . Other and unspecified ovarian cyst    PMHx  . Polydipsia   . Rosacea   . Sebaceous cyst     left buttock  . Unspecified essential hypertension     Past Surgical History:  Procedure Laterality Date  . APPENDECTOMY  1980  . CESAREAN SECTION     x 2; G2 P2  . COLONOSCOPY  2003, 2008   due 2018; Dr Earlean Shawl  . Peri ovarian cyst right resected  1980  . TONSILLECTOMY AND ADENOIDECTOMY    . UPPER GASTROINTESTINAL ENDOSCOPY  1986    Social History   Socioeconomic History  . Marital status: Divorced    Spouse name: Not on file  . Number of children: Not on file  . Years of education: Not  on file  . Highest education level: Not on file  Occupational History  . Occupation: Pharmacist, hospital  Social Needs  . Financial resource strain: Not on file  . Food insecurity:    Worry: Not on file    Inability: Not on file  . Transportation needs:    Medical: Not on file    Non-medical: Not on file  Tobacco Use  . Smoking status: Former Smoker    Last attempt to quit: 01/04/1969    Years since quitting: 49.2  . Smokeless tobacco: Never Used  . Tobacco comment: smoked [place], up to 2 ppd  Substance and Sexual Activity  . Alcohol use: No    Comment:  very rarely, < 1 glass of wine/ month  . Drug use: No  . Sexual activity: Not on file  Lifestyle  . Physical activity:    Days per week: Not on file    Minutes per session: Not on file  . Stress: Not on file  Relationships  . Social connections:    Talks on phone: Not on file    Gets together: Not on file    Attends religious service: Not on file    Active member of club or organization: Not on file    Attends meetings of clubs or organizations: Not on file    Relationship status: Not on file  Other Topics Concern  . 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 Diabetes Father   . Dementia Father   . Heart attack Father 51  . Heart disease Father   . Basal cell carcinoma Mother   . Hypertension Mother 90       Angioplasty  . Lung cancer Mother        Small cell  . Heart attack Mother 14  . Cancer Mother        lung and skin (basal cell carcinoma)  . Diabetes Sister   . Diabetes Maternal Grandfather   . Heart attack Maternal Aunt         X 2;ages 56  & 65  . Lung cancer Maternal Aunt        smoker  . Stroke Paternal Grandfather   . Dementia Paternal Grandfather   . Heart failure Maternal Aunt 62  . Obesity Sister        x 2  . Thyroid nodules Sister   . Prostate cancer Other        nephew  .  Other  Other        PTH nodule, nephew  . Diabetes Paternal Grandmother     Review of Systems  Constitutional: Negative for chills and fever.  Respiratory: Negative for shortness of breath.   Cardiovascular: Negative for chest pain, palpitations and leg swelling.  Skin: Positive for color change (Redness, swelling skin at the left medial first toenail). Negative for wound.  Neurological: Negative for light-headedness, numbness and headaches.       Objective:   Vitals:   03/29/18 0913  BP: (!) 154/88  Pulse: 76  Resp: 16  Temp: 98.7 F (37.1 C)  SpO2: 97%   BP Readings from Last 3 Encounters:  03/29/18 (!) 154/88  12/26/17 124/70  05/09/17 120/84   Wt Readings from Last 3 Encounters:  03/29/18 154 lb 12.8 oz (70.2 kg)  12/26/17 152 lb (68.9 kg)  05/09/17 164 lb (74.4 kg)   Body mass index is 28.31 kg/m.   Physical Exam Constitutional:      General: She is not in acute distress.    Appearance: Normal appearance. She is not ill-appearing.  Skin:    Comments: The medial aspect of her left first toenail is erythematous and mildly swollen.  There is more erythema in the skin on the proximal end of the medial toenail and it extends up to the distal medial aspect of the toenail.  No obvious fluctuance, minimal induration proximally.  With milking the area a very small amount of pus was extracted from the distal edge of the toenail where it meets the skin.  Neurological:     Mental Status: She is alert.     Sensory: No sensory deficit (Left first toe).            Assessment & Plan:    See Problem List for Assessment and Plan of chronic medical problems.

## 2018-03-29 NOTE — Assessment & Plan Note (Signed)
Medial aspect of great toe of left foot Associated erythema, pain and mild swelling I was able to extract a small amount of pus, but no area of fluctuance for drainage There has been improvement with conservative measures at the past couple of days Continue Epson salt soaks and applying Neosporin I will give her a prescription for Augmentin, but we both feel most likely she will not need this.  She will continue to do the above and monitor for the next day or 2 and take only if needed She will call or MyChart with any questions or concerns

## 2018-03-29 NOTE — Assessment & Plan Note (Signed)
Blood pressure well controlled at home with current medication On occasion she does have an elevated blood pressure in the 700F systolically, but this is not often Reviewed blood pressure log on phone-overall controlled Continue metoprolol at current dose She will continue to monitor at home

## 2018-03-29 NOTE — Telephone Encounter (Signed)
Called patient but she had already spoke with Lovena Le and set up an appointment.

## 2018-03-29 NOTE — Telephone Encounter (Signed)
Appointment made at 9:15

## 2018-06-20 ENCOUNTER — Other Ambulatory Visit: Payer: Self-pay | Admitting: Internal Medicine

## 2018-07-12 DIAGNOSIS — R739 Hyperglycemia, unspecified: Secondary | ICD-10-CM | POA: Insufficient documentation

## 2018-07-12 NOTE — Progress Notes (Signed)
Subjective:    Patient ID: Kimberly Stephenson, female    DOB: [birth year], 73 y.o.   MRN: [id number]  HPI She is here for a physical exam.    She thinks she had COVID-19 in March and wanted to have an antibody test.      Medications and allergies reviewed with patient and updated if appropriate.  Patient Active Problem List   Diagnosis Date Noted  . Hyperglycemia 07/12/2018  . Paronychia of great toe of left foot 03/29/2018  . Swollen gland 05/09/2017  . Hearing loss of right ear 12/15/2016  . Tinnitus 06/04/2016  . Chronic sinusitis 03/23/2016  . Pain of right hip joint 03/23/2016  . Palpitations 03/23/2016  . Vitamin D deficiency 06/08/2015  . 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 NONSPECIFIC ABNORMAL ELECTROCARDIOGRAM 07/19/2008  . Hyperlipemia 12/13/2006  . AMAUROSIS FUGAX 11/30/2006  . Essential hypertension 11/30/2006  . Irritable bowel syndrome 11/30/2006  . OVARIAN CYST 11/30/2006  . Rosacea 11/30/2006    Current Outpatient Medications on File Prior to Visit  Medication Sig Dispense Refill  . aspirin-acetaminophen-caffeine (EXCEDRIN MIGRAINE) 250-250-65 MG tablet Take 1 tablet by mouth daily. Reported on 04/08/2015    . Clindamycin-Benzoyl Per, Refr, (DUAC) gel Apply topically 2 (two) times daily.    . metoprolol succinate (TOPROL-XL) 50 MG 24 hr tablet TAKE 1 TABLET BY MOUTH EVERY DAY TAKE WITH OR IMMEDIATELY FOLLOWING A MEAL 90 tablet 0  . mometasone (ELOCON) 0.1 % cream as needed.    . Multiple Vitamins-Calcium (ONE-A-DAY WOMENS PO) Take 1 each by mouth daily.       No current facility-administered medications on file prior to visit.     Past Medical History:  Diagnosis Date  . Amaurosis fugax    x 3 ; resolved with BP meds  . Colitis, ischemic ([place]) 2010   Dr. Earlean Shawl  . Irritable bowel syndrome    Dr Earlean Shawl, GI  . Milk intolerance   . Nonspecific elevation of levels of transaminase or lactic acid dehydrogenase (LDH)   . Other  and unspecified hyperlipidemia    [place] study LDL goal=<130; NMR LDL  goal = < 160, ideally < 130  . Other and unspecified ovarian cyst    PMHx  . Polydipsia   . Rosacea   . Sebaceous cyst     left buttock  . Unspecified essential hypertension     Past Surgical History:  Procedure Laterality Date  . APPENDECTOMY  1980  . CESAREAN SECTION     x 2; G2 P2  . COLONOSCOPY  2003, 2008   due 2018; Dr Earlean Shawl  . Peri ovarian cyst right resected  1980  . TONSILLECTOMY AND ADENOIDECTOMY    . UPPER GASTROINTESTINAL ENDOSCOPY  1986    Social History   Socioeconomic History  . Marital status: Divorced    Spouse name: Not on file  . Number of children: Not on file  . Years of education: Not on file  . Highest education level: Not on file  Occupational History  . Occupation: Pharmacist, hospital  Social Needs  . Financial resource strain: Not on file  . Food insecurity    Worry: Not on file    Inability: Not on file  . Transportation needs    Medical: Not on file    Non-medical: Not on file  Tobacco Use  . Smoking status: Former Smoker    Quit date: 01/04/1969    Years since quitting: 49.5  . Smokeless tobacco:  Never Used  . Tobacco comment: smoked [place], up to 2 ppd  Substance and Sexual Activity  . Alcohol use: No    Comment:  very rarely, < 1 glass of wine/ month  . Drug use: No  . Sexual activity: Not on file  Lifestyle  . Physical activity    Days per week: Not on file    Minutes per session: Not on file  . Stress: Not on file  Relationships  . Social Herbalist on phone: Not on file    Gets together: Not on file    Attends religious service: Not on file    Active member of club or organization: Not on file    Attends meetings of clubs or organizations: Not on file    Relationship status: Not on file  Other Topics Concern  . 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 Diabetes Father   . Dementia Father   . Heart attack Father 47  . Heart disease Father   . Basal cell carcinoma Mother   . Hypertension Mother 17       Angioplasty  . Lung cancer Mother        Small cell  . Heart attack Mother 56  . Cancer Mother        lung and skin (basal cell carcinoma)  . Diabetes Sister   . Diabetes Maternal Grandfather   . Heart attack Maternal Aunt         X 2;ages 56  & 65  . Lung cancer Maternal Aunt        smoker  . Stroke Paternal Grandfather   . Dementia Paternal Grandfather   . Heart failure Maternal Aunt 62  . Obesity Sister        x 2  . Thyroid nodules Sister   . Prostate cancer Other        nephew  . Other Other        PTH nodule, nephew  . Diabetes Paternal Grandmother     Review of Systems  Constitutional: Negative for chills and fever.  Eyes: Negative for visual disturbance.  Respiratory: Positive for cough (throat clearing cough). Negative for shortness of breath and wheezing.   Cardiovascular: Negative for chest pain, palpitations and leg swelling.  Gastrointestinal: Negative for abdominal pain, blood in stool, constipation, diarrhea and nausea.       No gerd  Genitourinary: Negative for dysuria and hematuria.  Musculoskeletal: Positive for arthralgias. Negative for back pain.  Skin: Negative for color change and rash.       No active rash - has intermittent yeast infection under breasts  Neurological: Positive for headaches (almost daily). Negative for dizziness, light-headedness and numbness.  Psychiatric/Behavioral: Negative for dysphoric mood. The patient is not nervous/anxious.        Objective:   Vitals:   07/13/18 0745  BP: (!) 144/88  Pulse: 64  Resp: 16  Temp: 98.8 F (37.1 C)  SpO2: 96%   Filed Weights   07/13/18 0745  Weight: 155 lb 1.9 oz (70.4 kg)   Body mass index is 28.37 kg/m.  BP Readings from Last 3 Encounters:  07/13/18 (!) 144/88  03/29/18 (!) 154/88  12/26/17  124/70    Wt Readings from Last 3 Encounters:  07/13/18 155 lb  1.9 oz (70.4 kg)  03/29/18 154 lb 12.8 oz (70.2 kg)  12/26/17 152 lb (68.9 kg)     Physical Exam Constitutional: She appears well-developed and well-nourished. No distress.  HENT:  Head: Normocephalic and atraumatic.  Right Ear: External ear normal. Normal ear canal and TM Left Ear: External ear normal.  Normal ear canal and TM Mouth/Throat: Oropharynx is clear and moist.  Eyes: Conjunctivae and EOM are normal.  Neck: Neck supple. No tracheal deviation present. No thyromegaly present.  No carotid bruit  Cardiovascular: Normal rate, regular rhythm and normal heart sounds.   No murmur heard.  No edema. Pulmonary/Chest: Effort normal and breath sounds normal. No respiratory distress. She has no wheezes. She has no rales.  Breast: deferred   Abdominal: Soft. She exhibits no distension. There is no tenderness.  Lymphadenopathy: She has no cervical adenopathy.  Skin: Skin is warm and dry. She is not diaphoretic.  Psychiatric: She has a normal mood and affect. Her behavior is normal.        Assessment & Plan:   Physical exam: Screening blood work ordered Immunizations  Declined pneumonia and shingrix, others up to date cologuard Up to date  - due 04/2019 Mammogram not up to date - will schedule eventually Dexa - does not feel like she needs this - declined Eye exams - Dr Katy Fitch --   Up to date  Exercise     Walking irregularly Weight   Overweight - ok for age Skin     Sees Dr Ronnald Ramp annually, no concerns today Substance abuse  none   See Problem List for Assessment and Plan of chronic medical problems.    FU in one year

## 2018-07-12 NOTE — Patient Instructions (Addendum)
Tests ordered today. Your results will be released to [place] (or called to you) after review.  If any changes need to be made, you will be notified at that same time.  All other Health Maintenance issues reviewed.   All recommended immunizations and age-appropriate screenings are up-to-date or discussed.  No immunization administered today.   Medications reviewed and updated.  Changes include :   Nystatin powder   Please followup in 12 months    Health Maintenance, Female Adopting a healthy lifestyle and getting preventive care are important in promoting health and wellness. Ask your health care provider about:  The right schedule for you to have regular tests and exams.  Things you can do on your own to prevent diseases and keep yourself healthy. What should I know about diet, weight, and exercise? Eat a healthy diet   Eat a diet that includes plenty of vegetables, fruits, low-fat dairy products, and lean protein.  Do not eat a lot of foods that are high in solid fats, added sugars, or sodium. Maintain a healthy weight Body mass index (BMI) is used to identify weight problems. It estimates body fat based on height and weight. Your health care provider can help determine your BMI and help you achieve or maintain a healthy weight. Get regular exercise Get regular exercise. This is one of the most important things you can do for your health. Most adults should:  Exercise for at least 150 minutes each week. The exercise should increase your heart rate and make you sweat (moderate-intensity exercise).  Do strengthening exercises at least twice a week. This is in addition to the moderate-intensity exercise.  Spend less time sitting. Even light physical activity can be beneficial. Watch cholesterol and blood lipids Have your blood tested for lipids and cholesterol at 71 years of age, then have this test every 5 years. Have your cholesterol levels checked more often if:  Your  lipid or cholesterol levels are high.  You are older than 71 years of age.  You are at high risk for heart disease. What should I know about cancer screening? Depending on your health history and family history, you may need to have cancer screening at various ages. This may include screening for:  Breast cancer.  Cervical cancer.  Colorectal cancer.  Skin cancer.  Lung cancer. What should I know about heart disease, diabetes, and high blood pressure? Blood pressure and heart disease  High blood pressure causes heart disease and increases the risk of stroke. This is more likely to develop in people who have high blood pressure readings, are of African descent, or are overweight.  Have your blood pressure checked: ? Every 3-5 years if you are 70-48 years of age. ? Every year if you are 73 years old or older. Diabetes Have regular diabetes screenings. This checks your fasting blood sugar level. Have the screening done:  Once every three years after age 35 if you are at a normal weight and have a low risk for diabetes.  More often and at a younger age if you are overweight or have a high risk for diabetes. What should I know about preventing infection? Hepatitis B If you have a higher risk for hepatitis B, you should be screened for this virus. Talk with your health care provider to find out if you are at risk for hepatitis B infection. Hepatitis C Testing is recommended for:  Everyone born from [birth year] through 1965.  Anyone with known risk factors for hepatitis C.  Sexually transmitted infections (STIs)  Get screened for STIs, including gonorrhea and chlamydia, if: ? You are sexually active and are younger than 71 years of age. ? You are older than 71 years of age and your health care provider tells you that you are at risk for this type of infection. ? Your sexual activity has changed since you were last screened, and you are at increased risk for chlamydia or gonorrhea. Ask  your health care provider if you are at risk.  Ask your health care provider about whether you are at high risk for HIV. Your health care provider may recommend a prescription medicine to help prevent HIV infection. If you choose to take medicine to prevent HIV, you should first get tested for HIV. You should then be tested every 3 months for as long as you are taking the medicine. Pregnancy  If you are about to stop having your period (premenopausal) and you may become pregnant, seek counseling before you get pregnant.  Take 400 to 800 micrograms (mcg) of folic acid every day if you become pregnant.  Ask for birth control (contraception) if you want to prevent pregnancy. Osteoporosis and menopause Osteoporosis is a disease in which the bones lose minerals and strength with aging. This can result in bone fractures. If you are 80 years old or older, or if you are at risk for osteoporosis and fractures, ask your health care provider if you should:  Be screened for bone loss.  Take a calcium or vitamin D supplement to lower your risk of fractures.  Be given hormone replacement therapy (HRT) to treat symptoms of menopause. Follow these instructions at home: Lifestyle  Do not use any products that contain nicotine or tobacco, such as cigarettes, e-cigarettes, and chewing tobacco. If you need help quitting, ask your health care provider.  Do not use street drugs.  Do not share needles.  Ask your health care provider for help if you need support or information about quitting drugs. Alcohol use  Do not drink alcohol if: ? Your health care provider tells you not to drink. ? You are pregnant, may be pregnant, or are planning to become pregnant.  If you drink alcohol: ? Limit how much you use to 0-1 drink a day. ? Limit intake if you are breastfeeding.  Be aware of how much alcohol is in your drink. In the [place] one drink equals one 12 oz bottle of beer (355 mL), one 5 oz glass of wine  (148 mL), or one 1 oz glass of hard liquor (44 mL). General instructions  Schedule regular health, dental, and eye exams.  Stay current with your vaccines.  Tell your health care provider if: ? You often feel depressed. ? You have ever been abused or do not feel safe at home. Summary  Adopting a healthy lifestyle and getting preventive care are important in promoting health and wellness.  Follow your health care provider's instructions about healthy diet, exercising, and getting tested or screened for diseases.  Follow your health care provider's instructions on monitoring your cholesterol and blood pressure. This information is not intended to replace advice given to you by your health care provider. Make sure you discuss any questions you have with your health care provider. Document Released: 07/06/2010 Document Revised: 12/14/2017 Document Reviewed: 12/14/2017 Elsevier Patient Education  2020 [employer].

## 2018-07-13 ENCOUNTER — Ambulatory Visit (INDEPENDENT_AMBULATORY_CARE_PROVIDER_SITE_OTHER): Payer: Medicare Other | Admitting: Internal Medicine

## 2018-07-13 ENCOUNTER — Encounter: Payer: Self-pay | Admitting: Internal Medicine

## 2018-07-13 ENCOUNTER — Other Ambulatory Visit (INDEPENDENT_AMBULATORY_CARE_PROVIDER_SITE_OTHER): Payer: Medicare Other

## 2018-07-13 ENCOUNTER — Other Ambulatory Visit: Payer: Self-pay

## 2018-07-13 VITALS — BP 144/88 | HR 64 | Temp 98.8°F | Resp 16 | Ht 62.0 in | Wt 155.1 lb

## 2018-07-13 DIAGNOSIS — I10 Essential (primary) hypertension: Secondary | ICD-10-CM | POA: Diagnosis not present

## 2018-07-13 DIAGNOSIS — R739 Hyperglycemia, unspecified: Secondary | ICD-10-CM | POA: Diagnosis not present

## 2018-07-13 DIAGNOSIS — Z1159 Encounter for screening for other viral diseases: Secondary | ICD-10-CM

## 2018-07-13 DIAGNOSIS — B372 Candidiasis of skin and nail: Secondary | ICD-10-CM | POA: Insufficient documentation

## 2018-07-13 DIAGNOSIS — Z0000 Encounter for general adult medical examination without abnormal findings: Secondary | ICD-10-CM

## 2018-07-13 DIAGNOSIS — E7849 Other hyperlipidemia: Secondary | ICD-10-CM

## 2018-07-13 LAB — COMPREHENSIVE METABOLIC PANEL
ALT: 17 U/L (ref 0–35)
AST: 19 U/L (ref 0–37)
Albumin: 4.2 g/dL (ref 3.5–5.2)
Alkaline Phosphatase: 64 U/L (ref 39–117)
BUN: 13 mg/dL (ref 6–23)
CO2: 26 mEq/L (ref 19–32)
Calcium: 9.3 mg/dL (ref 8.4–10.5)
Chloride: 106 mEq/L (ref 96–112)
Creatinine, Ser: 0.84 mg/dL (ref 0.40–1.20)
GFR: 66.9 mL/min (ref >60.00)
Glucose, Bld: 97 mg/dL (ref 70–99)
Potassium: 4.2 mEq/L (ref 3.5–5.1)
Sodium: 141 mEq/L (ref 135–145)
Total Bilirubin: 0.5 mg/dL (ref 0.2–1.2)
Total Protein: 7 g/dL (ref 6.0–8.3)

## 2018-07-13 LAB — CBC WITH DIFFERENTIAL/PLATELET
Basophils Absolute: 0.1 10*3/uL (ref 0.0–0.1)
Basophils Relative: 1.2 % (ref 0.0–3.0)
Eosinophils Absolute: 0.2 10*3/uL (ref 0.0–0.7)
Eosinophils Relative: 4.2 % (ref 0.0–5.0)
HCT: 39.9 % (ref 36.0–46.0)
Hemoglobin: 13.5 g/dL (ref 12.0–15.0)
Lymphocytes Relative: 32.8 % (ref 12.0–46.0)
Lymphs Abs: 1.8 10*3/uL (ref 0.7–4.0)
MCHC: 33.9 g/dL (ref 30.0–36.0)
MCV: 89 fl (ref 78.0–100.0)
Monocytes Absolute: 0.5 10*3/uL (ref 0.1–1.0)
Monocytes Relative: 9.8 % (ref 3.0–12.0)
Neutro Abs: 2.8 10*3/uL (ref 1.4–7.7)
Neutrophils Relative %: 52 % (ref 43.0–77.0)
Platelets: 222 10*3/uL (ref 150.0–400.0)
RBC: 4.48 Mil/uL (ref 3.87–5.11)
RDW: 13.2 % (ref 11.5–15.5)
WBC: 5.4 10*3/uL (ref 4.0–10.5)

## 2018-07-13 LAB — LIPID PANEL
Cholesterol: 198 mg/dL (ref 0–200)
HDL: 68.9 mg/dL (ref >39.00)
LDL Cholesterol: 109 mg/dL — ABNORMAL HIGH (ref 0–99)
NonHDL: 129.19
Total CHOL/HDL Ratio: 3
Triglycerides: 100 mg/dL (ref 0.0–149.0)
VLDL: 20 mg/dL (ref 0.0–40.0)

## 2018-07-13 LAB — TSH: TSH: 4.5 u[IU]/mL (ref 0.35–4.50)

## 2018-07-13 LAB — HEMOGLOBIN A1C: Hgb A1c MFr Bld: 5.4 % (ref 4.6–6.5)

## 2018-07-13 MED ORDER — METOPROLOL SUCCINATE ER 50 MG PO TB24: ORAL_TABLET | ORAL | 3 refills | Status: DC

## 2018-07-13 MED ORDER — NYSTATIN 100000 UNIT/GM EX POWD: Freq: Four times a day (QID) | CUTANEOUS | 3 refills | Status: DC

## 2018-07-13 NOTE — Assessment & Plan Note (Signed)
Check lipid panel, cmp, tsh  Diet controlled Regular exercise and healthy diet encouraged

## 2018-07-13 NOTE — Assessment & Plan Note (Signed)
BP slightly elevated here - she has not been compliant with a low sodium diet and has not been exercising recently - she will work on lifestyle No change in medication cmp

## 2018-07-13 NOTE — Assessment & Plan Note (Signed)
a1c

## 2018-07-13 NOTE — Assessment & Plan Note (Signed)
Intermittent under breasts Nystatin powder

## 2018-07-14 LAB — SARS-COV-2 ANTIBODY, IGM: SARS-CoV-2 Antibody, IgM: NEGATIVE

## 2018-07-16 ENCOUNTER — Encounter: Payer: Self-pay | Admitting: Internal Medicine

## 2018-07-28 ENCOUNTER — Encounter: Payer: Medicare Other | Admitting: Internal Medicine

## 2018-09-25 ENCOUNTER — Encounter: Payer: Medicare Other | Admitting: Internal Medicine

## 2019-01-30 ENCOUNTER — Telehealth: Payer: Self-pay

## 2019-01-30 MED ORDER — LEVOCETIRIZINE DIHYDROCHLORIDE 5 MG PO TABS: 5.0000 mg | ORAL_TABLET | Freq: Every evening | ORAL | 5 refills | Status: DC

## 2019-01-30 NOTE — Telephone Encounter (Signed)
Ok to fill? Not on current list. Just checking

## 2019-01-30 NOTE — Telephone Encounter (Signed)
Prescription sent

## 2019-01-30 NOTE — Telephone Encounter (Signed)
New message     1. Which medications need to be refilled? (please list name of each medication and dose if known) levocetirizine 5 mg tab  2. Which pharmacy/location (including street and city if local pharmacy) is medication to be sent to? [facility] on [address]  [phone]   3. Do they need a 30 day or 90 day supply? 30 days supply

## 2019-02-04 ENCOUNTER — Ambulatory Visit: Payer: Medicare Other

## 2019-02-11 ENCOUNTER — Ambulatory Visit: Payer: Medicare PPO | Attending: Internal Medicine

## 2019-02-11 ENCOUNTER — Ambulatory Visit: Payer: Medicare PPO

## 2019-02-11 DIAGNOSIS — Z23 Encounter for immunization: Secondary | ICD-10-CM | POA: Insufficient documentation

## 2019-02-11 NOTE — Progress Notes (Signed)
   Covid-19 Vaccination Clinic  Name:  Kimberly Stephenson    MRN: [id number] DOB: 05-Jan-1946  02/11/2019  [name] was observed post Covid-19 immunization for 15 minutes without incidence. She was provided with Vaccine Information Sheet and instruction to access the V-Safe system.   [name] was instructed to call 911 with any severe reactions post vaccine: Marland Kitchen Difficulty breathing  . Swelling of your face and throat  . A fast heartbeat  . A bad rash all over your body  . Dizziness and weakness    Immunizations Administered    Name Date Dose VIS Date Route   Pfizer COVID-19 Vaccine 02/11/2019  5:06 PM 0.3 mL 12/15/2018 Intramuscular   Manufacturer: [place]   Lot: [id number]   [place]: [id number]

## 2019-02-15 ENCOUNTER — Ambulatory Visit: Payer: Medicare Other

## 2019-03-08 ENCOUNTER — Ambulatory Visit: Payer: Medicare PPO | Attending: Internal Medicine

## 2019-03-08 DIAGNOSIS — Z23 Encounter for immunization: Secondary | ICD-10-CM

## 2019-03-08 NOTE — Progress Notes (Signed)
   Covid-19 Vaccination Clinic  Name:  Kimberly Stephenson    MRN: [id number] DOB: January 26, 1945  03/08/2019  [name] was observed post Covid-19 immunization for 15 minutes without incident. She was provided with Vaccine Information Sheet and instruction to access the V-Safe system.   [name] was instructed to call 911 with any severe reactions post vaccine: Marland Kitchen Difficulty breathing  . Swelling of face and throat  . A fast heartbeat  . A bad rash all over body  . Dizziness and weakness   Immunizations Administered    Name Date Dose VIS Date Route   Pfizer COVID-19 Vaccine 03/08/2019  1:41 PM 0.3 mL 12/15/2018 Intramuscular   Manufacturer: [place]   Lot: [id number]   [place]: [id number]

## 2019-03-22 NOTE — Progress Notes (Signed)
Virtual Visit via Video Note  I connected with Kimberly Stephenson on 03/23/19 at  9:00 AM EDT by a video enabled telemedicine application and verified that I am speaking with the correct person using two identifiers.   I discussed the limitations of evaluation and management by telemedicine and the availability of in person appointments. The patient expressed understanding and agreed to proceed.  Present for the visit:  Myself, Dr Billey Gosling, Lysle Morales.  The patient is currently at home and I am in the office.    No referring provider.    History of Present Illness: She is here for an acute visit for cold symptoms.   Her symptoms started 3 weeks ago.  She started experiencing bloody sinus drainage and had Augmentin I prescribed last year at home and started taking it 1 week ago.  It has helped.  She also took dexamethasone she had leftover for 3 days and that helped as well.  She feels 95% better, but is not 100% and is concerned this is going to come back.  She has had a history of chronic sinusitis in the past.  She still has some nasal congestion, sinus pain, sore throat, cough with some sputum production and headaches.  She has not had fevers.  Her shortness of breath and decreased lung capacity have improved since starting the antibiotic and feels back to normal.  She has a lot of drainage and it feels like it is irritating her throat and vocal cords.  She is taking Xyzal, Benadryl and doing some saline sprays/salt water nasal lavages.     Review of Systems  Constitutional: Negative for fever.  HENT: Positive for congestion, sinus pain and sore throat.   Respiratory: Positive for cough, sputum production and shortness of breath (initially, resolved).   Neurological: Positive for headaches.      Social History   Socioeconomic History  . Marital status: Divorced    Spouse name: Not on file  . Number of children: Not on file  . Years of education: Not on file  . Highest  education level: Not on file  Occupational History  . Occupation: Pharmacist, hospital  Tobacco Use  . Smoking status: Former Smoker    Quit date: 01/04/1969    Years since quitting: 50.2  . Smokeless tobacco: Never Used  . Tobacco comment: smoked [place], up to 2 ppd  Substance and Sexual Activity  . Alcohol use: No    Comment:  very rarely, < 1 glass of wine/ month  . Drug use: No  . Sexual activity: Not on file  Other Topics Concern  . 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 Difficulty of Paying Living Expenses:   Food Insecurity:   . Worried About Charity fundraiser in the Last Year:   . Arboriculturist in the Last Year:   Transportation Needs:   . Film/video editor (Medical):   Marland Kitchen Lack of Transportation (Non-Medical):   Physical Activity:   . Days of Exercise per Week:   . Minutes of Exercise per Session:   Stress:   . Feeling of Stress :   Social Connections:   . Frequency of Communication with Friends and Family:   . Frequency of Social Gatherings with Friends and Family:   . Attends Religious  Services:   . Active Member of Clubs or Organizations:   . Attends Archivist Meetings:   Marland Kitchen Marital Status:      Observations/Objective: Appears well in NAD Breathing normally, speaking in full sentences Skin appears warm and dry  Assessment and Plan:  See Problem List for Assessment and Plan of chronic medical problems.   Follow Up Instructions:    I discussed the assessment and treatment plan with the patient. The patient was provided an opportunity to ask questions and all were answered. The patient agreed with the plan and demonstrated an understanding of the instructions.   The patient was advised to call back or seek an in-person evaluation if the symptoms worsen or if the condition fails to improve as anticipated.    Binnie Rail, MD

## 2019-03-23 ENCOUNTER — Ambulatory Visit (INDEPENDENT_AMBULATORY_CARE_PROVIDER_SITE_OTHER): Payer: Medicare PPO | Admitting: Internal Medicine

## 2019-03-23 ENCOUNTER — Encounter: Payer: Self-pay | Admitting: Internal Medicine

## 2019-03-23 DIAGNOSIS — J329 Chronic sinusitis, unspecified: Secondary | ICD-10-CM | POA: Diagnosis not present

## 2019-03-23 MED ORDER — DEXAMETHASONE 6 MG PO TABS: 6.0000 mg | ORAL_TABLET | Freq: Every day | ORAL | 0 refills | Status: DC

## 2019-03-23 MED ORDER — AMOXICILLIN-POT CLAVULANATE 875-125 MG PO TABS: 1.0000 | ORAL_TABLET | Freq: Two times a day (BID) | ORAL | 0 refills | Status: DC

## 2019-03-23 NOTE — Assessment & Plan Note (Signed)
Sinus infection ongoing for at least 1 month Symptoms improved after starting Augmentin and taking a few days of dexamethasone.  At this point her infection is partially treated She does have a few days left of the antibiotic, but given her history was sinus infections and the fact that this infection has been ongoing for over a month we will give her an additional 4 days of Augmentin We will also do an additional 3 days of dexamethasone 6 mg She will continue the allergy medications and saline spray/nasal rinses She will let me know if there is no improvement or complete resolution

## 2019-04-09 NOTE — Progress Notes (Signed)
Virtual Visit via Video Note  I connected with Kimberly Stephenson on 04/09/19 at 11:15 AM EDT by a video enabled telemedicine application and verified that I am speaking with the correct person using two identifiers.   I discussed the limitations of evaluation and management by telemedicine and the availability of in person appointments. The patient expressed understanding and agreed to proceed.  Present for the visit:  Myself, Dr Billey Gosling, Lysle Morales.  The patient is currently at home and I am in the office.    No referring provider.    History of Present Illness: This is an acute visit for trouble breathing.  After taking additional antibiotic and steroids her sinus symptoms improved and she feels 75-90% better.  She still has a dry hacking cough.  2 days ago she was outside for a couple of hours and felt tightness in her chest and was not able to get a deep breath.  She felt like she had some junk in her chest and was not able to get it up.  The cough got worse.  She does have allergies and does take Xyzal half a pill at night.  She did start taking an extra half of a pill and has been also taking some Benadryl.  She thinks this helps.  She thinks it is related to allergies, but was concerned about this episode and not been able to breathe.  She was wondering if she needed an inhaler.  Since then she has felt okay, but is concerned about having another episode.    Review of Systems  Constitutional: Negative for chills and fever.  Respiratory: Positive for cough. Negative for sputum production, shortness of breath (not able to get deep breath) and wheezing.   Cardiovascular: Negative for chest pain.     Social History   Socioeconomic History  . Marital status: Divorced    Spouse name: Not on file  . Number of children: Not on file  . Years of education: Not on file  . Highest education level: Not on file  Occupational History  . Occupation: Pharmacist, hospital  Tobacco Use  .  Smoking status: Former Smoker    Quit date: 01/04/1969    Years since quitting: 50.2  . Smokeless tobacco: Never Used  . Tobacco comment: smoked [place], up to 2 ppd  Substance and Sexual Activity  . Alcohol use: No    Comment:  very rarely, < 1 glass of wine/ month  . Drug use: No  . Sexual activity: Not on file  Other Topics Concern  . 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 Difficulty of Paying Living Expenses:   Food Insecurity:   . Worried About Charity fundraiser in the Last Year:   . Arboriculturist in the Last Year:   Transportation Needs:   . Film/video editor (Medical):   Marland Kitchen Lack of Transportation (Non-Medical):   Physical Activity:   . Days of Exercise per Week:   . Minutes of Exercise per Session:   Stress:   . Feeling of Stress :   Social Connections:   . Frequency of Communication with Friends and Family:   . Frequency of Social Gatherings with Friends and Family:   . Attends Religious Services:   . Active Member of Clubs or  Organizations:   . Attends Archivist Meetings:   Marland Kitchen Marital Status:      Observations/Objective: Appears well in NAD Breathing normally, speaking in full sentences Skin appears warm and dry  Assessment and Plan:  See Problem List for Assessment and Plan of chronic medical problems.   Follow Up Instructions:    I discussed the assessment and treatment plan with the patient. The patient was provided an opportunity to ask questions and all were answered. The patient agreed with the plan and demonstrated an understanding of the instructions.   The patient was advised to call back or seek an in-person evaluation if the symptoms worsen or if the condition fails to improve as anticipated.    Binnie Rail, MD

## 2019-04-10 ENCOUNTER — Ambulatory Visit (INDEPENDENT_AMBULATORY_CARE_PROVIDER_SITE_OTHER): Payer: Medicare PPO | Admitting: Internal Medicine

## 2019-04-10 ENCOUNTER — Encounter: Payer: Self-pay | Admitting: Internal Medicine

## 2019-04-10 DIAGNOSIS — J4520 Mild intermittent asthma, uncomplicated: Secondary | ICD-10-CM

## 2019-04-10 DIAGNOSIS — J309 Allergic rhinitis, unspecified: Secondary | ICD-10-CM | POA: Insufficient documentation

## 2019-04-10 MED ORDER — ALBUTEROL SULFATE HFA 108 (90 BASE) MCG/ACT IN AERS: 2.0000 | INHALATION_SPRAY | Freq: Four times a day (QID) | RESPIRATORY_TRACT | 5 refills | Status: DC | PRN

## 2019-04-10 NOTE — Assessment & Plan Note (Signed)
Chronic Has had allergies for years Takes Xyzal nightly-continue She has Flonase or Nasacort that she can take if needed, but this does cause significant drying Discussed Singulair-she was not interested in trying this years ago because of possible side effects Can take extra Benadryl if needed Need to keep allergies controlled, which will flare up airway disease

## 2019-04-10 NOTE — Assessment & Plan Note (Signed)
Acute Symptoms consistent with mild, intermittent reactive airway disease secondary to seasonal allergies Discussed allergy medication and maintaining good control of her seasonal allergies Albuterol as needed If the albuterol is not effective or she is using it too frequently discussed that we may need to do a maintenance inhaler during allergy season Albuterol sent to pharmacy

## 2019-04-11 DIAGNOSIS — L720 Epidermal cyst: Secondary | ICD-10-CM | POA: Diagnosis not present

## 2019-04-11 DIAGNOSIS — L304 Erythema intertrigo: Secondary | ICD-10-CM | POA: Diagnosis not present

## 2019-04-11 DIAGNOSIS — D1801 Hemangioma of skin and subcutaneous tissue: Secondary | ICD-10-CM | POA: Diagnosis not present

## 2019-04-11 DIAGNOSIS — L821 Other seborrheic keratosis: Secondary | ICD-10-CM | POA: Diagnosis not present

## 2019-04-11 DIAGNOSIS — L812 Freckles: Secondary | ICD-10-CM | POA: Diagnosis not present

## 2019-04-11 DIAGNOSIS — L308 Other specified dermatitis: Secondary | ICD-10-CM | POA: Diagnosis not present

## 2019-07-04 ENCOUNTER — Telehealth: Payer: Self-pay | Admitting: Internal Medicine

## 2019-07-11 NOTE — Progress Notes (Signed)
Subjective:    Patient ID: Kimberly Stephenson, female    DOB: 04-May-1945, 74 y.o.   MRN: [id number]  HPI The patient is here for an acute visit.   Hip pain; her right hip hurts - this is the 4-5th time in the past few years.  If she stands up straight it hurts on the anterior aspect.   7 weeks ago she did walk through her front door and her left foot got caught on the door and her front leg was outstretched.  The pain started the next day.  She is unsure if that is related or not.   Her pain is 90% better.  This time the pain radiated down the anterior right leg to the big toe.  She has pain in her right quad, hamstrings and gluts.  She was not able to lift her leg to go up the steps for weeks.  she can do it now, but it is still painful.  She did have tingling in her right foot.  The pain in the right leg and tingling are intermittent.  She denies muscle spasms, but has some tightness in her back on the right side which she thinks is more chronic.  This is a lump that she feels on the side of the vertebrae.  She takes a hot bath and a couple of advil and her back feels better.  She typically only takes 2 Advil once a day and not daily.  She denies change bowel or bladder.      DG HIP UNILAT W OR W/O PELVIS 2-3 VIEWS RIGHT CLINICAL DATA:  Right groin pain that radiates to the hip area  EXAM: DG HIP (WITH OR WITHOUT PELVIS) 2-3V RIGHT  COMPARISON:  None.  FINDINGS: Mild degenerative changes of the right hip. No acute fracture or malalignment. Soft tissues are unremarkable.  IMPRESSION: Mild degenerative changes.  No acute osseous abnormality.  Electronically Signed   By: Donavan Foil M.D.   On: 03/23/2016 15:49   Medications and allergies reviewed with patient and updated if appropriate.  Patient Active Problem List   Diagnosis Date Noted  . Allergic rhinitis 04/10/2019  . Mild intermittent reactive airway disease 04/10/2019  . Skin yeast infection 07/13/2018  .  Hyperglycemia 07/12/2018  . Hearing loss of right ear 12/15/2016  . Tinnitus 06/04/2016  . Chronic sinusitis 03/23/2016  . Pain of right hip joint 03/23/2016  . Palpitations 03/23/2016  . Vitamin D deficiency 06/08/2015  . 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 NONSPECIFIC ABNORMAL ELECTROCARDIOGRAM 07/19/2008  . Hyperlipemia 12/13/2006  . AMAUROSIS FUGAX 11/30/2006  . Essential hypertension 11/30/2006  . Irritable bowel syndrome 11/30/2006  . OVARIAN CYST 11/30/2006  . Rosacea 11/30/2006    Current Outpatient Medications on File Prior to Visit  Medication Sig Dispense Refill  . albuterol (VENTOLIN HFA) 108 (90 Base) MCG/ACT inhaler Inhale 2 puffs into the lungs every 6 (six) hours as needed for wheezing or shortness of breath. 6.7 g 5  . aspirin-acetaminophen-caffeine (EXCEDRIN MIGRAINE) 250-250-65 MG tablet Take 1 tablet by mouth daily. Reported on 04/08/2015    . Clindamycin-Benzoyl Per, Refr, (DUAC) gel Apply topically 2 (two) times daily.    Marland Kitchen levocetirizine (XYZAL) 5 MG tablet Take 1 tablet (5 mg total) by mouth every evening. 30 tablet 5  . metoprolol succinate (TOPROL-XL) 50 MG 24 hr tablet TAKE 1 TABLET BY MOUTH EVERY DAY TAKE WITH OR IMMEDIATELY FOLLOWING A MEAL 90 tablet 3  .  mometasone (ELOCON) 0.1 % cream as needed.    . Multiple Vitamins-Calcium (ONE-A-DAY WOMENS PO) Take 1 each by mouth daily.      Marland Kitchen nystatin (NYSTATIN) powder Apply topically 4 (four) times daily. (Patient not taking: Reported on 07/12/2019) 60 g 3   No current facility-administered medications on file prior to visit.    Past Medical History:  Diagnosis Date  . Amaurosis fugax    x 3 ; resolved with BP meds  . Colitis, ischemic ([place]) 2010   Dr. Earlean Shawl  . Irritable bowel syndrome    Dr Earlean Shawl, GI  . Milk intolerance   . Nonspecific elevation of levels of transaminase or lactic acid dehydrogenase (LDH)   . Other and unspecified hyperlipidemia    [place] study LDL  goal=<130; NMR LDL  goal = < 160, ideally < 130  . Other and unspecified ovarian cyst    PMHx  . Polydipsia   . Rosacea   . Sebaceous cyst     left buttock  . Unspecified essential hypertension     Past Surgical History:  Procedure Laterality Date  . APPENDECTOMY  1980  . CESAREAN SECTION     x 2; G2 P2  . COLONOSCOPY  2003, 2008   due 2018; Dr Earlean Shawl  . Peri ovarian cyst right resected  1980  . TONSILLECTOMY AND ADENOIDECTOMY    . UPPER GASTROINTESTINAL ENDOSCOPY  1986    Social History   Socioeconomic History  . Marital status: Divorced    Spouse name: Not on file  . Number of children: Not on file  . Years of education: Not on file  . Highest education level: Not on file  Occupational History  . Occupation: Pharmacist, hospital  Tobacco Use  . Smoking status: Former Smoker    Quit date: 01/04/1969    Years since quitting: 50.5  . Smokeless tobacco: Never Used  . Tobacco comment: smoked [place], up to 2 ppd  Substance and Sexual Activity  . Alcohol use: No    Comment:  very rarely, < 1 glass of wine/ month  . Drug use: No  . Sexual activity: Not on file  Other Topics Concern  . 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 Difficulty of Paying Living Expenses:   Food Insecurity:   . Worried About Charity fundraiser in the Last Year:   . Arboriculturist in the Last Year:   Transportation Needs:   . Film/video editor (Medical):   Marland Kitchen Lack of Transportation (Non-Medical):   Physical Activity:   . Days of Exercise per Week:   . Minutes of Exercise per Session:   Stress:   . Feeling of Stress :   Social Connections:   . Frequency of Communication with Friends and Family:   . Frequency of Social Gatherings with Friends and Family:   . Attends Religious Services:   . Active Member of Clubs or Organizations:   . Attends Archivist  Meetings:   Marland Kitchen Marital Status:     Family History  Problem Relation Age of Onset  . Diabetes Father   . Dementia Father   . Heart attack Father 21  . Heart disease Father   . Basal cell carcinoma Mother   . Hypertension Mother 56  Angioplasty  . Lung cancer Mother        Small cell  . Heart attack Mother 34  . Cancer Mother        lung and skin (basal cell carcinoma)  . Diabetes Sister   . Diabetes Maternal Grandfather   . Heart attack Maternal Aunt         X 2;ages 56  & 65  . Lung cancer Maternal Aunt        smoker  . Stroke Paternal Grandfather   . Dementia Paternal Grandfather   . Heart failure Maternal Aunt 62  . Obesity Sister        x 2  . Thyroid nodules Sister   . Prostate cancer Other        nephew  . Other Other        PTH nodule, nephew  . Diabetes Paternal Grandmother     Review of Systems Per HPI    Objective:   Vitals:   07/12/19 0936  BP: 130/82  Pulse: 80  Temp: 98 F (36.7 C)  SpO2: 97%   BP Readings from Last 3 Encounters:  07/12/19 130/82  07/13/18 (!) 144/88  03/29/18 (!) 154/88   Wt Readings from Last 3 Encounters:  07/12/19 157 lb 6.4 oz (71.4 kg)  07/13/18 155 lb 1.9 oz (70.4 kg)  03/29/18 154 lb 12.8 oz (70.2 kg)   Body mass index is 28.79 kg/m.   Physical Exam Constitutional:      General: She is not in acute distress.    Appearance: Normal appearance. She is not ill-appearing.  Musculoskeletal:        General: No tenderness (No tenderness along the lumbar spine more right paravertebral area.  Right psoas feels more tight and prominent than left, but there is no tenderness to touch).     Right lower leg: No edema.     Left lower leg: No edema.  Skin:    General: Skin is warm and dry.  Neurological:     General: No focal deficit present.     Mental Status: She is alert.     Sensory: No sensory deficit.     Motor: No weakness.     Gait: Gait normal.            Assessment & Plan:    See Problem List  for Assessment and Plan of chronic medical problems.    This visit occurred during the SARS-CoV-2 public health emergency.  Safety protocols were in place, including screening questions prior to the visit, additional usage of staff PPE, and extensive cleaning of exam room while observing appropriate contact time as indicated for disinfecting solutions.

## 2019-07-12 ENCOUNTER — Encounter: Payer: Self-pay | Admitting: Internal Medicine

## 2019-07-12 ENCOUNTER — Other Ambulatory Visit: Payer: Self-pay

## 2019-07-12 ENCOUNTER — Ambulatory Visit (INDEPENDENT_AMBULATORY_CARE_PROVIDER_SITE_OTHER): Payer: Medicare PPO

## 2019-07-12 ENCOUNTER — Ambulatory Visit: Payer: Medicare PPO | Admitting: Internal Medicine

## 2019-07-12 VITALS — BP 130/82 | HR 80 | Temp 98.0°F | Ht 62.0 in | Wt 157.4 lb

## 2019-07-12 DIAGNOSIS — M545 Low back pain: Secondary | ICD-10-CM | POA: Diagnosis not present

## 2019-07-12 DIAGNOSIS — R1031 Right lower quadrant pain: Secondary | ICD-10-CM

## 2019-07-12 DIAGNOSIS — M5416 Radiculopathy, lumbar region: Secondary | ICD-10-CM | POA: Insufficient documentation

## 2019-07-12 DIAGNOSIS — M160 Bilateral primary osteoarthritis of hip: Secondary | ICD-10-CM | POA: Diagnosis not present

## 2019-07-12 NOTE — Assessment & Plan Note (Addendum)
Acute Started about 7 weeks ago She does have some lumbar radiculopathy, but is also having some right groin pain This may also be some element of radiculopathy, but may also be a flare of her right hip osteoarthritis Prior x-ray from 2018 showed mild arthritis Will check x-ray today see if there has been any advancement Continue Advil as needed She will consider seeing an orthopedic, me know if she wants referral

## 2019-07-12 NOTE — Assessment & Plan Note (Signed)
Acute Symptoms started 7 weeks ago and have improved-about 90% improvement Symptoms consistent with L4 radiculopathy Discussed treatment options She deferred PT and orthopedics referral at this time, but will let me know if she changes her mind We will do exercises at work and will try to get back into the pool and do some gentle swimming Lumbar x-ray today Continue Advil as needed-she is taking a minimal amount-advised taking with food She deferred other medication including gabapentin, muscle relaxer and steroid taper at this time Avoid bending, lifting and twisting Call if no improvement

## 2019-07-12 NOTE — Patient Instructions (Addendum)
Have an x-ray downstairs.    Continue the advil as needed.  Start swimming.    Consider PT and seeing an orthopedic.    Please call if there is no improvement in your symptoms.   Sciatica Rehab Ask your health care provider which exercises are safe for you. Do exercises exactly as told by your health care provider and adjust them as directed. It is normal to feel mild stretching, pulling, tightness, or discomfort as you do these exercises. Stop right away if you feel sudden pain or your pain gets worse. Do not begin these exercises until told by your health care provider. Stretching and range-of-motion exercises These exercises warm up your muscles and joints and improve the movement and flexibility of your hips and back. These exercises also help to relieve pain, numbness, and tingling. Sciatic nerve glide 1. Sit in a chair with your head facing down toward your chest. Place your hands behind your back. Let your shoulders slump forward. 2. Slowly straighten one of your legs while you tilt your head back as if you are looking toward the ceiling. Only straighten your leg as far as you can without making your symptoms worse. 3. Hold this position for __________ seconds. 4. Slowly return to the starting position. 5. Repeat with your other leg. Repeat __________ times. Complete this exercise __________ times a day. Knee to chest with hip adduction and internal rotation  1. Lie on your back on a firm surface with both legs straight. 2. Bend one of your knees and move it up toward your chest until you feel a gentle stretch in your lower back and buttock. Then, move your knee toward the shoulder that is on the opposite side from your leg. This is hip adduction and internal rotation. ? Hold your leg in this position by holding on to the front of your knee. 3. Hold this position for __________ seconds. 4. Slowly return to the starting position. 5. Repeat with your other leg. Repeat __________  times. Complete this exercise __________ times a day. Prone extension on elbows  1. Lie on your abdomen on a firm surface. A bed may be too soft for this exercise. 2. Prop yourself up on your elbows. 3. Use your arms to help lift your chest up until you feel a gentle stretch in your abdomen and your lower back. ? This will place some of your body weight on your elbows. If this is uncomfortable, try stacking pillows under your chest. ? Your hips should stay down, against the surface that you are lying on. Keep your hip and back muscles relaxed. 4. Hold this position for __________ seconds. 5. Slowly relax your upper body and return to the starting position. Repeat __________ times. Complete this exercise __________ times a day. Strengthening exercises These exercises build strength and endurance in your back. Endurance is the ability to use your muscles for a long time, even after they get tired. Pelvic tilt This exercise strengthens the muscles that lie deep in the abdomen. 1. Lie on your back on a firm surface. Bend your knees and keep your feet flat on the floor. 2. Tense your abdominal muscles. Tip your pelvis up toward the ceiling and flatten your lower back into the floor. ? To help with this exercise, you may place a small towel under your lower back and try to push your back into the towel. 3. Hold this position for __________ seconds. 4. Let your muscles relax completely before you repeat this exercise.  Repeat __________ times. Complete this exercise __________ times a day. Alternating arm and leg raises  1. Get on your hands and knees on a firm surface. If you are on a hard floor, you may want to use padding, such as an exercise mat, to cushion your knees. 2. Line up your arms and legs. Your hands should be directly below your shoulders, and your knees should be directly below your hips. 3. Lift your left leg behind you. At the same time, raise your right arm and straighten it in  front of you. ? Do not lift your leg higher than your hip. ? Do not lift your arm higher than your shoulder. ? Keep your abdominal and back muscles tight. ? Keep your hips facing the ground. ? Do not arch your back. ? Keep your balance carefully, and do not hold your breath. 4. Hold this position for __________ seconds. 5. Slowly return to the starting position. 6. Repeat with your right leg and your left arm. Repeat __________ times. Complete this exercise __________ times a day. Posture and body mechanics Good posture and healthy body mechanics can help to relieve stress in your body's tissues and joints. Body mechanics refers to the movements and positions of your body while you do your daily activities. Posture is part of body mechanics. Good posture means:  Your spine is in its natural S-curve position (neutral).  Your shoulders are pulled back slightly.  Your head is not tipped forward. Follow these guidelines to improve your posture and body mechanics in your everyday activities. Standing   When standing, keep your spine neutral and your feet about hip width apart. Keep a slight bend in your knees. Your ears, shoulders, and hips should line up.  When you do a task in which you stand in one place for a long time, place one foot up on a stable object that is 2-4 inches (5-10 cm) high, such as a footstool. This helps keep your spine neutral. Sitting   When sitting, keep your spine neutral and keep your feet flat on the floor. Use a footrest, if necessary, and keep your thighs parallel to the floor. Avoid rounding your shoulders, and avoid tilting your head forward.  When working at a desk or a computer, keep your desk at a height where your hands are slightly lower than your elbows. Slide your chair under your desk so you are close enough to maintain good posture.  When working at a computer, place your monitor at a height where you are looking straight ahead and you do not have  to tilt your head forward or downward to look at the screen. Resting  When lying down and resting, avoid positions that are most painful for you.  If you have pain with activities such as sitting, bending, stooping, or squatting, lie in a position in which your body does not bend very much. For example, avoid curling up on your side with your arms and knees near your chest (fetal position).  If you have pain with activities such as standing for a long time or reaching with your arms, lie with your spine in a neutral position and bend your knees slightly. Try the following positions: ? Lying on your side with a pillow between your knees. ? Lying on your back with a pillow under your knees. Lifting   When lifting objects, keep your feet at least shoulder width apart and tighten your abdominal muscles.  Bend your knees and hips and keep  your spine neutral. It is important to lift using the strength of your legs, not your back. Do not lock your knees straight out.  Always ask for help to lift heavy or awkward objects. This information is not intended to replace advice given to you by your health care provider. Make sure you discuss any questions you have with your health care provider. Document Revised: 04/14/2018 Document Reviewed: 01/12/2018 Elsevier Patient Education  [place].

## 2019-07-16 NOTE — Progress Notes (Signed)
Subjective:    Patient ID: Kimberly Stephenson, female    DOB: 04/23/1945, 74 y.o.   MRN: [id number]  HPI She is here for a physical exam.   Her lumbar radiculopathy has got a little better.  She still has pain, but does not want to be referred at this time for further evaluation.    Medications and allergies reviewed with patient and updated if appropriate.  Patient Active Problem List   Diagnosis Date Noted  . Lumbar radiculopathy 07/12/2019  . Right groin pain 07/12/2019  . Allergic rhinitis 04/10/2019  . Mild intermittent reactive airway disease 04/10/2019  . Skin yeast infection 07/13/2018  . Hyperglycemia 07/12/2018  . Hearing loss of right ear 12/15/2016  . Tinnitus 06/04/2016  . Chronic sinusitis 03/23/2016  . Pain of right hip joint 03/23/2016  . Palpitations 03/23/2016  . Vitamin D deficiency 06/08/2015  . 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 NONSPECIFIC ABNORMAL ELECTROCARDIOGRAM 07/19/2008  . Hyperlipemia 12/13/2006  . AMAUROSIS FUGAX 11/30/2006  . Essential hypertension 11/30/2006  . Irritable bowel syndrome 11/30/2006  . OVARIAN CYST 11/30/2006  . Rosacea 11/30/2006    Current Outpatient Medications on File Prior to Visit  Medication Sig Dispense Refill  . albuterol (VENTOLIN HFA) 108 (90 Base) MCG/ACT inhaler Inhale 2 puffs into the lungs every 6 (six) hours as needed for wheezing or shortness of breath. 6.7 g 5  . aspirin-acetaminophen-caffeine (EXCEDRIN MIGRAINE) 250-250-65 MG tablet Take 1 tablet by mouth daily. Reported on 04/08/2015    . Clindamycin-Benzoyl Per, Refr, (DUAC) gel Apply topically 2 (two) times daily.    Marland Kitchen levocetirizine (XYZAL) 5 MG tablet Take 1 tablet (5 mg total) by mouth every evening. 30 tablet 5  . metoprolol succinate (TOPROL-XL) 50 MG 24 hr tablet TAKE 1 TABLET BY MOUTH EVERY DAY TAKE WITH OR IMMEDIATELY FOLLOWING A MEAL 90 tablet 3  . mometasone (ELOCON) 0.1 % cream as needed.    . Multiple Vitamins-Calcium  (ONE-A-DAY WOMENS PO) Take 1 each by mouth daily.       No current facility-administered medications on file prior to visit.    Past Medical History:  Diagnosis Date  . Amaurosis fugax    x 3 ; resolved with BP meds  . Colitis, ischemic ([place]) 2010   Dr. Earlean Shawl  . Irritable bowel syndrome    Dr Earlean Shawl, GI  . Milk intolerance   . Nonspecific elevation of levels of transaminase or lactic acid dehydrogenase (LDH)   . Other and unspecified hyperlipidemia    [place] study LDL goal=<130; NMR LDL  goal = < 160, ideally < 130  . Other and unspecified ovarian cyst    PMHx  . Polydipsia   . Rosacea   . Sebaceous cyst     left buttock  . Unspecified essential hypertension     Past Surgical History:  Procedure Laterality Date  . APPENDECTOMY  1980  . CESAREAN SECTION     x 2; G2 P2  . COLONOSCOPY  2003, 2008   due 2018; Dr Earlean Shawl  . Peri ovarian cyst right resected  1980  . TONSILLECTOMY AND ADENOIDECTOMY    . UPPER GASTROINTESTINAL ENDOSCOPY  1986    Social History   Socioeconomic History  . Marital status: Divorced    Spouse name: Not on file  . Number of children: Not on file  . Years of education: Not on file  . Highest education level: Not on file  Occupational History  . Occupation:  Teacher  Tobacco Use  . Smoking status: Former Smoker    Quit date: 01/04/1969    Years since quitting: 50.5  . Smokeless tobacco: Never Used  . Tobacco comment: smoked [place], up to 2 ppd  Substance and Sexual Activity  . Alcohol use: No    Comment:  very rarely, < 1 glass of wine/ month  . Drug use: No  . Sexual activity: Not on file  Other Topics Concern  . 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 Difficulty of Paying Living Expenses:   Food Insecurity:   . Worried About Charity fundraiser in the Last Year:   . Arboriculturist in the Last Year:    Transportation Needs:   . Film/video editor (Medical):   Marland Kitchen Lack of Transportation (Non-Medical):   Physical Activity:   . Days of Exercise per Week:   . Minutes of Exercise per Session:   Stress:   . Feeling of Stress :   Social Connections:   . Frequency of Communication with Friends and Family:   . Frequency of Social Gatherings with Friends and Family:   . Attends Religious Services:   . Active Member of Clubs or Organizations:   . Attends Archivist Meetings:   Marland Kitchen Marital Status:     Family History  Problem Relation Age of Onset  . Diabetes Father   . Dementia Father   . Heart attack Father 20  . Heart disease Father   . Basal cell carcinoma Mother   . Hypertension Mother 89       Angioplasty  . Lung cancer Mother        Small cell  . Heart attack Mother 18  . Cancer Mother        lung and skin (basal cell carcinoma)  . Diabetes Sister   . Diabetes Maternal Grandfather   . Heart attack Maternal Aunt         X 2;ages 56  & 65  . Lung cancer Maternal Aunt        smoker  . Stroke Paternal Grandfather   . Dementia Paternal Grandfather   . Heart failure Maternal Aunt 62  . Obesity Sister        x 2  . Thyroid nodules Sister   . Prostate cancer Other        nephew  . Other Other        PTH nodule, nephew  . Diabetes Paternal Grandmother     Review of Systems  Constitutional: Negative for chills and fever.  HENT: Positive for postnasal drip.   Eyes: Negative for visual disturbance.  Respiratory: Positive for cough (chronic - better with xyzal). Negative for shortness of breath and wheezing.   Cardiovascular: Negative for chest pain, palpitations and leg swelling.  Gastrointestinal: Negative for abdominal pain, blood in stool, constipation, diarrhea and nausea.       No gerd  Genitourinary: Negative for dysuria and hematuria.  Musculoskeletal: Positive for arthralgias (right hip, generalized aches ) and back pain.  Skin: Negative for rash.   Neurological: Positive for headaches (occ). Negative for light-headedness.  Psychiatric/Behavioral: Negative for dysphoric mood. The patient is not nervous/anxious.        Objective:   Vitals:   07/17/19 0840  BP:  128/80  Pulse: 74  Temp: 98 F (36.7 C)  SpO2: 96%   Filed Weights   07/17/19 0840  Weight: 157 lb (71.2 kg)   Body mass index is 28.72 kg/m.  BP Readings from Last 3 Encounters:  07/17/19 128/80  07/12/19 130/82  07/13/18 (!) 144/88    Wt Readings from Last 3 Encounters:  07/17/19 157 lb (71.2 kg)  07/12/19 157 lb 6.4 oz (71.4 kg)  07/13/18 155 lb 1.9 oz (70.4 kg)     Physical Exam Constitutional: She appears well-developed and well-nourished. No distress.  HENT:  Head: Normocephalic and atraumatic.  Right Ear: External ear normal. Normal ear canal and TM Left Ear: External ear normal.  Normal ear canal and TM Mouth/Throat: Oropharynx is clear and moist.  Eyes: Conjunctivae and EOM are normal.  Neck: Neck supple. No tracheal deviation present. No thyromegaly present.  No carotid bruit  Cardiovascular: Normal rate, regular rhythm and normal heart sounds.   No murmur heard.  No edema. Pulmonary/Chest: Effort normal and breath sounds normal. No respiratory distress. She has no wheezes. She has no rales.  Breast: deferred   Abdominal: Soft. She exhibits no distension. There is no tenderness.  Lymphadenopathy: She has no cervical adenopathy.  Skin: Skin is warm and dry. She is not diaphoretic.  Psychiatric: She has a normal mood and affect. Her behavior is normal.        Assessment & Plan:   Physical exam: Screening blood work    ordered Immunizations  Discussed shingrix/pneumonia, others up to date Colonoscopy / cologuard - due  - she declined  Mammogram  Not up to date - will schedule Gyn  - not up to date - will schedule Dexa  Declined  Eye exams  Up to date - Dr Katy Fitch Exercise  Limited by hip/back pain  - plans to start swimming Weight   Overweight - ok for age Substance abuse  None Sees Dr Ronnald Ramp annually for skin check  See Problem List for Assessment and Plan of chronic medical problems.   This visit occurred during the SARS-CoV-2 public health emergency.  Safety protocols were in place, including screening questions prior to the visit, additional usage of staff PPE, and extensive cleaning of exam room while observing appropriate contact time as indicated for disinfecting solutions.

## 2019-07-16 NOTE — Patient Instructions (Addendum)
Blood work was ordered.    All other Health Maintenance issues reviewed.   All recommended immunizations and age-appropriate screenings are up-to-date or discussed.  No immunization administered today.   Medications reviewed and updated.  Changes include :   none     Please followup in 1 year    Health Maintenance, Female Adopting a healthy lifestyle and getting preventive care are important in promoting health and wellness. Ask your health care provider about:  The right schedule for you to have regular tests and exams.  Things you can do on your own to prevent diseases and keep yourself healthy. What should I know about diet, weight, and exercise? Eat a healthy diet   Eat a diet that includes plenty of vegetables, fruits, low-fat dairy products, and lean protein.  Do not eat a lot of foods that are high in solid fats, added sugars, or sodium. Maintain a healthy weight Body mass index (BMI) is used to identify weight problems. It estimates body fat based on height and weight. Your health care provider can help determine your BMI and help you achieve or maintain a healthy weight. Get regular exercise Get regular exercise. This is one of the most important things you can do for your health. Most adults should:  Exercise for at least 150 minutes each week. The exercise should increase your heart rate and make you sweat (moderate-intensity exercise).  Do strengthening exercises at least twice a week. This is in addition to the moderate-intensity exercise.  Spend less time sitting. Even light physical activity can be beneficial. Watch cholesterol and blood lipids Have your blood tested for lipids and cholesterol at 72 years of age, then have this test every 5 years. Have your cholesterol levels checked more often if:  Your lipid or cholesterol levels are high.  You are older than 72 years of age.  You are at high risk for heart disease. What should I know about cancer  screening? Depending on your health history and family history, you may need to have cancer screening at various ages. This may include screening for:  Breast cancer.  Cervical cancer.  Colorectal cancer.  Skin cancer.  Lung cancer. What should I know about heart disease, diabetes, and high blood pressure? Blood pressure and heart disease  High blood pressure causes heart disease and increases the risk of stroke. This is more likely to develop in people who have high blood pressure readings, are of African descent, or are overweight.  Have your blood pressure checked: ? Every 3-5 years if you are 18-39 years of age. ? Every year if you are 40 years old or older. Diabetes Have regular diabetes screenings. This checks your fasting blood sugar level. Have the screening done:  Once every three years after age 40 if you are at a normal weight and have a low risk for diabetes.  More often and at a younger age if you are overweight or have a high risk for diabetes. What should I know about preventing infection? Hepatitis B If you have a higher risk for hepatitis B, you should be screened for this virus. Talk with your health care provider to find out if you are at risk for hepatitis B infection. Hepatitis C Testing is recommended for:  Everyone born from 1945 through 1965.  Anyone with known risk factors for hepatitis C. Sexually transmitted infections (STIs)  Get screened for STIs, including gonorrhea and chlamydia, if: ? You are sexually active and are younger than 72 years   of age. ? You are older than 72 years of age and your health care provider tells you that you are at risk for this type of infection. ? Your sexual activity has changed since you were last screened, and you are at increased risk for chlamydia or gonorrhea. Ask your health care provider if you are at risk.  Ask your health care provider about whether you are at high risk for HIV. Your health care provider may  recommend a prescription medicine to help prevent HIV infection. If you choose to take medicine to prevent HIV, you should first get tested for HIV. You should then be tested every 3 months for as long as you are taking the medicine. Pregnancy  If you are about to stop having your period (premenopausal) and you may become pregnant, seek counseling before you get pregnant.  Take 400 to 800 micrograms (mcg) of folic acid every day if you become pregnant.  Ask for birth control (contraception) if you want to prevent pregnancy. Osteoporosis and menopause Osteoporosis is a disease in which the bones lose minerals and strength with aging. This can result in bone fractures. If you are 65 years old or older, or if you are at risk for osteoporosis and fractures, ask your health care provider if you should:  Be screened for bone loss.  Take a calcium or vitamin D supplement to lower your risk of fractures.  Be given hormone replacement therapy (HRT) to treat symptoms of menopause. Follow these instructions at home: Lifestyle  Do not use any products that contain nicotine or tobacco, such as cigarettes, e-cigarettes, and chewing tobacco. If you need help quitting, ask your health care provider.  Do not use street drugs.  Do not share needles.  Ask your health care provider for help if you need support or information about quitting drugs. Alcohol use  Do not drink alcohol if: ? Your health care provider tells you not to drink. ? You are pregnant, may be pregnant, or are planning to become pregnant.  If you drink alcohol: ? Limit how much you use to 0-1 drink a day. ? Limit intake if you are breastfeeding.  Be aware of how much alcohol is in your drink. In the [place] one drink equals one 12 oz bottle of beer (355 mL), one 5 oz glass of wine (148 mL), or one 1 oz glass of hard liquor (44 mL). General instructions  Schedule regular health, dental, and eye exams.  Stay current with your  vaccines.  Tell your health care provider if: ? You often feel depressed. ? You have ever been abused or do not feel safe at home. Summary  Adopting a healthy lifestyle and getting preventive care are important in promoting health and wellness.  Follow your health care provider's instructions about healthy diet, exercising, and getting tested or screened for diseases.  Follow your health care provider's instructions on monitoring your cholesterol and blood pressure. This information is not intended to replace advice given to you by your health care provider. Make sure you discuss any questions you have with your health care provider. Document Revised: 12/14/2017 Document Reviewed: 12/14/2017 Elsevier Patient Education  2020 [employer]  

## 2019-07-17 ENCOUNTER — Ambulatory Visit (INDEPENDENT_AMBULATORY_CARE_PROVIDER_SITE_OTHER): Payer: Medicare PPO | Admitting: Internal Medicine

## 2019-07-17 ENCOUNTER — Encounter: Payer: Self-pay | Admitting: Internal Medicine

## 2019-07-17 ENCOUNTER — Other Ambulatory Visit: Payer: Self-pay

## 2019-07-17 VITALS — BP 128/80 | HR 74 | Temp 98.0°F | Ht 62.0 in | Wt 157.0 lb

## 2019-07-17 DIAGNOSIS — E7849 Other hyperlipidemia: Secondary | ICD-10-CM

## 2019-07-17 DIAGNOSIS — Z0000 Encounter for general adult medical examination without abnormal findings: Secondary | ICD-10-CM | POA: Diagnosis not present

## 2019-07-17 DIAGNOSIS — E559 Vitamin D deficiency, unspecified: Secondary | ICD-10-CM | POA: Diagnosis not present

## 2019-07-17 DIAGNOSIS — I10 Essential (primary) hypertension: Secondary | ICD-10-CM

## 2019-07-17 DIAGNOSIS — R739 Hyperglycemia, unspecified: Secondary | ICD-10-CM

## 2019-07-17 DIAGNOSIS — J4520 Mild intermittent asthma, uncomplicated: Secondary | ICD-10-CM

## 2019-07-17 DIAGNOSIS — J309 Allergic rhinitis, unspecified: Secondary | ICD-10-CM | POA: Diagnosis not present

## 2019-07-17 MED ORDER — VITAMIN D3 25 MCG (1000 UT) PO CAPS: 1.0000 | ORAL_CAPSULE | Freq: Every day | ORAL | Status: AC

## 2019-07-17 NOTE — Assessment & Plan Note (Signed)
Chronic Taking vitamin d daily Check level 

## 2019-07-17 NOTE — Assessment & Plan Note (Signed)
Chronic Check a1c Low sugar / carb diet Stressed regular exercise  

## 2019-07-17 NOTE — Assessment & Plan Note (Signed)
Chronic BP well controlled Current regimen effective and well tolerated Continue current medications at current doses cmp  

## 2019-07-17 NOTE — Assessment & Plan Note (Signed)
Chronic Controlled with current medication

## 2019-07-17 NOTE — Assessment & Plan Note (Signed)
Chronic Check lipid panel  Diet controlled Regular exercise and healthy diet encouraged

## 2019-07-17 NOTE — Assessment & Plan Note (Signed)
Chronic Controlled Allergy medication, albuterol prn continue

## 2019-07-18 LAB — CBC WITH DIFFERENTIAL/PLATELET
Absolute Monocytes: 526 cells/uL (ref 200–950)
Basophils Absolute: 62 cells/uL (ref 0–200)
Basophils Relative: 1.1 %
Eosinophils Absolute: 258 cells/uL (ref 15–500)
Eosinophils Relative: 4.6 %
HCT: 40.5 % (ref 35.0–45.0)
Hemoglobin: 13.9 g/dL (ref 11.7–15.5)
Lymphs Abs: 2128 cells/uL (ref 850–3900)
MCH: 30.6 pg (ref 27.0–33.0)
MCHC: 34.3 g/dL (ref 32.0–36.0)
MCV: 89.2 fL (ref 80.0–100.0)
MPV: 9.5 fL (ref 7.5–12.5)
Monocytes Relative: 9.4 %
Neutro Abs: 2626 cells/uL (ref 1500–7800)
Neutrophils Relative %: 46.9 %
Platelets: 229 10*3/uL (ref 140–400)
RBC: 4.54 10*6/uL (ref 3.80–5.10)
RDW: 12.7 % (ref 11.0–15.0)
Total Lymphocyte: 38 %
WBC: 5.6 10*3/uL (ref 3.8–10.8)

## 2019-07-18 LAB — LIPID PANEL
Cholesterol: 225 mg/dL — ABNORMAL HIGH (ref <200)
HDL: 69 mg/dL (ref >=50)
LDL Cholesterol (Calc): 135 mg/dL (calc) — ABNORMAL HIGH
Non-HDL Cholesterol (Calc): 156 mg/dL (calc) — ABNORMAL HIGH (ref <130)
Total CHOL/HDL Ratio: 3.3 (calc) (ref <5.0)
Triglycerides: 103 mg/dL (ref <150)

## 2019-07-18 LAB — TSH: TSH: 4 mIU/L (ref 0.40–4.50)

## 2019-07-18 LAB — COMPREHENSIVE METABOLIC PANEL
AG Ratio: 1.8 (calc) (ref 1.0–2.5)
ALT: 17 U/L (ref 6–29)
AST: 21 U/L (ref 10–35)
Albumin: 4.2 g/dL (ref 3.6–5.1)
Alkaline phosphatase (APISO): 63 U/L (ref 37–153)
BUN: 12 mg/dL (ref 7–25)
CO2: 27 mmol/L (ref 20–32)
Calcium: 9.5 mg/dL (ref 8.6–10.4)
Chloride: 105 mmol/L (ref 98–110)
Creat: 0.83 mg/dL (ref 0.60–0.93)
Globulin: 2.4 g/dL (calc) (ref 1.9–3.7)
Glucose, Bld: 89 mg/dL (ref 65–99)
Potassium: 4.1 mmol/L (ref 3.5–5.3)
Sodium: 140 mmol/L (ref 135–146)
Total Bilirubin: 0.5 mg/dL (ref 0.2–1.2)
Total Protein: 6.6 g/dL (ref 6.1–8.1)

## 2019-07-18 LAB — HEMOGLOBIN A1C
Hgb A1c MFr Bld: 5.1 % of total Hgb (ref <5.7)
Mean Plasma Glucose: 100 (calc)
eAG (mmol/L): 5.5 (calc)

## 2019-07-18 LAB — VITAMIN D 25 HYDROXY (VIT D DEFICIENCY, FRACTURES): Vit D, 25-Hydroxy: 34 ng/mL (ref 30–100)

## 2019-09-17 ENCOUNTER — Other Ambulatory Visit: Payer: Self-pay | Admitting: Internal Medicine

## 2019-10-07 NOTE — Progress Notes (Signed)
Subjective:    Patient ID: Kimberly Stephenson, female    DOB: 1945/09/15, 74 y.o.   MRN: [id number]  HPI The patient is here for an acute visit.   Right hip, leg and groin pain --  This pain started the beginning of July.  She has done home exercises and taken Advil for this only.     Her leg is better, but still present.  This is the longest it has ever lasted.  This is the 4-5th episodes in the past 5 years.  The sharp pain in her right groin has gone away about 2 weeks ago.  She has more of a bursitis type pain in the lateral hip - it is a dull pain.  She still has pain in the center of her anterior upper leg that feels deep.  She has occasional weakness in the leg.  She has occ right posterior upper leg and dorsal foot pain.    She started swimming about one month ago.  Walking aggravates her pain.  She takes advil prn.    She has occ sharp twinge in her RLQ since 04/2018.  This is similar to a pain she had in 1980 when she had a paraovarian cyst removed.  She was pregnant at that time.  She has not had cysts since then.  She did not see her gyn.    She has difficulty breathing when laying flat at night.  She is ok when taking the xyzal.  She knows this is her chronic sinusitis.  It is a little worse.  She thinks she needs an abx.     Medications and allergies reviewed with patient and updated if appropriate.  Patient Active Problem List   Diagnosis Date Noted  . Lumbar radiculopathy 07/12/2019  . Right groin pain 07/12/2019  . Allergic rhinitis 04/10/2019  . Mild intermittent reactive airway disease 04/10/2019  . Skin yeast infection 07/13/2018  . Hyperglycemia 07/12/2018  . Hearing loss of right ear 12/15/2016  . Tinnitus 06/04/2016  . Chronic sinusitis 03/23/2016  . Right hip pain 03/23/2016  . Palpitations 03/23/2016  . Vitamin D deficiency 06/08/2015  . 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 NONSPECIFIC ABNORMAL ELECTROCARDIOGRAM 07/19/2008  .  Hyperlipemia 12/13/2006  . AMAUROSIS FUGAX 11/30/2006  . Essential hypertension 11/30/2006  . Irritable bowel syndrome 11/30/2006  . OVARIAN CYST 11/30/2006  . Rosacea 11/30/2006    Current Outpatient Medications on File Prior to Visit  Medication Sig Dispense Refill  . albuterol (VENTOLIN HFA) 108 (90 Base) MCG/ACT inhaler Inhale 2 puffs into the lungs every 6 (six) hours as needed for wheezing or shortness of breath. 6.7 g 5  . aspirin-acetaminophen-caffeine (EXCEDRIN MIGRAINE) 250-250-65 MG tablet Take 1 tablet by mouth daily. Reported on 04/08/2015    . Cholecalciferol (VITAMIN D3) 25 MCG (1000 UT) CAPS Take 1 capsule (1,000 Units total) by mouth daily. 60 capsule   . Clindamycin-Benzoyl Per, Refr, (DUAC) gel Apply topically 2 (two) times daily.    Marland Kitchen levocetirizine (XYZAL) 5 MG tablet Take 1 tablet (5 mg total) by mouth every evening. 30 tablet 5  . metoprolol succinate (TOPROL-XL) 50 MG 24 hr tablet TAKE 1 TABLET BY MOUTH EVERY DAY WITH OR IMMEDIATELY FOLLOWING A MEAL 90 tablet 2  . mometasone (ELOCON) 0.1 % cream as needed.    . Multiple Vitamins-Calcium (ONE-A-DAY WOMENS PO) Take 1 each by mouth daily.       No current facility-administered medications on file prior  to visit.    Past Medical History:  Diagnosis Date  . Amaurosis fugax    x 3 ; resolved with BP meds  . Colitis, ischemic ([place]) 2010   Dr. Earlean Shawl  . Irritable bowel syndrome    Dr Earlean Shawl, GI  . Milk intolerance   . Nonspecific elevation of levels of transaminase or lactic acid dehydrogenase (LDH)   . Other and unspecified hyperlipidemia    [place] study LDL goal=<130; NMR LDL  goal = < 160, ideally < 130  . Other and unspecified ovarian cyst    PMHx  . Polydipsia   . Rosacea   . Sebaceous cyst     left buttock  . Unspecified essential hypertension     Past Surgical History:  Procedure Laterality Date  . APPENDECTOMY  1980  . CESAREAN SECTION     x 2; G2 P2  . COLONOSCOPY  2003, 2008   due 2018; Dr  Earlean Shawl  . Peri ovarian cyst right resected  1980  . TONSILLECTOMY AND ADENOIDECTOMY    . UPPER GASTROINTESTINAL ENDOSCOPY  1986    Social History   Socioeconomic History  . Marital status: Divorced    Spouse name: Not on file  . Number of children: Not on file  . Years of education: Not on file  . Highest education level: Not on file  Occupational History  . Occupation: Pharmacist, hospital  Tobacco Use  . Smoking status: Former Smoker    Quit date: 01/04/1969    Years since quitting: 50.7  . Smokeless tobacco: Never Used  . Tobacco comment: smoked [place], up to 2 ppd  Substance and Sexual Activity  . Alcohol use: No    Comment:  very rarely, < 1 glass of wine/ month  . Drug use: No  . Sexual activity: Not on file  Other Topics Concern  . 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 Difficulty of Paying Living Expenses: Not on file  Food Insecurity:   . Worried About Charity fundraiser in the Last Year: Not on file  . Ran Out of Food in the Last Year: Not on file  Transportation Needs:   . Lack of Transportation (Medical): Not on file  . Lack of Transportation (Non-Medical): Not on file  Physical Activity:   . Days of Exercise per Week: Not on file  . Minutes of Exercise per Session: Not on file  Stress:   . Feeling of Stress : Not on file  Social Connections:   . Frequency of Communication with Friends and Family: Not on file  . Frequency of Social Gatherings with Friends and Family: Not on file  . Attends Religious Services: Not on file  . Active Member of Clubs or Organizations: Not on file  . Attends Archivist Meetings: Not on file  . Marital Status: Not on file    Family History  Problem Relation Age of Onset  . Diabetes Father   . Dementia Father   . Heart attack Father 18  . Heart disease Father   . Basal cell carcinoma Mother    . Hypertension Mother 1       Angioplasty  . Lung cancer Mother        Small cell  . Heart attack Mother 25  . Cancer Mother  lung and skin (basal cell carcinoma)  . Diabetes Sister   . Diabetes Maternal Grandfather   . Heart attack Maternal Aunt         X 2;ages 56  & 65  . Lung cancer Maternal Aunt        smoker  . Stroke Paternal Grandfather   . Dementia Paternal Grandfather   . Heart failure Maternal Aunt 62  . Obesity Sister        x 2  . Thyroid nodules Sister   . Prostate cancer Other        nephew  . Other Other        PTH nodule, nephew  . Diabetes Paternal Grandmother     Review of Systems  Constitutional: Negative for fever.  HENT: Positive for congestion, postnasal drip, sinus pressure and sinus pain.   Respiratory: Positive for cough (chronic, occ productive).   Musculoskeletal: Positive for arthralgias.  Neurological: Positive for numbness (right foot) and headaches.       Objective:   Vitals:   10/08/19 1527  BP: 128/72  Pulse: 82  Temp: 98.8 F (37.1 C)  SpO2: 97%   BP Readings from Last 3 Encounters:  10/08/19 128/72  07/17/19 128/80  07/12/19 130/82   Wt Readings from Last 3 Encounters:  10/08/19 158 lb 6.4 oz (71.8 kg)  07/17/19 157 lb (71.2 kg)  07/12/19 157 lb 6.4 oz (71.4 kg)   Body mass index is 28.97 kg/m.   Physical Exam    GENERAL APPEARANCE: Appears stated age, well appearing, NAD EYES: conjunctiva clear, no icterus HEENT: bilateral tympanic membranes and ear canals normal, oropharynx with no erythema, no thyromegaly, trachea midline, no cervical or supraclavicular lymphadenopathy LUNGS: Clear to auscultation without wheeze or crackles, unlabored breathing, good air entry bilaterally CARDIOVASCULAR: Normal S1,S2 without murmurs, no edema MSK:  Minimal tenderness R anterior hip, normal sensation in b/l le, walks with a limp, pain with movement of right leg SKIN: Warm, dry      Assessment & Plan:    See  Problem List for Assessment and Plan of chronic medical problems.    This visit occurred during the SARS-CoV-2 public health emergency.  Safety protocols were in place, including screening questions prior to the visit, additional usage of staff PPE, and extensive cleaning of exam room while observing appropriate contact time as indicated for disinfecting solutions.

## 2019-10-08 ENCOUNTER — Ambulatory Visit (INDEPENDENT_AMBULATORY_CARE_PROVIDER_SITE_OTHER): Payer: Medicare PPO | Admitting: Internal Medicine

## 2019-10-08 ENCOUNTER — Other Ambulatory Visit: Payer: Self-pay

## 2019-10-08 ENCOUNTER — Encounter: Payer: Self-pay | Admitting: Internal Medicine

## 2019-10-08 VITALS — BP 128/72 | HR 82 | Temp 98.8°F | Ht 62.0 in | Wt 158.4 lb

## 2019-10-08 DIAGNOSIS — M25551 Pain in right hip: Secondary | ICD-10-CM | POA: Diagnosis not present

## 2019-10-08 DIAGNOSIS — J329 Chronic sinusitis, unspecified: Secondary | ICD-10-CM

## 2019-10-08 MED ORDER — DEXAMETHASONE 6 MG PO TABS: 6.0000 mg | ORAL_TABLET | Freq: Every day | ORAL | 0 refills | Status: DC

## 2019-10-08 MED ORDER — AMOXICILLIN 500 MG PO CAPS: 500.0000 mg | ORAL_CAPSULE | Freq: Three times a day (TID) | ORAL | 0 refills | Status: DC

## 2019-10-08 NOTE — Assessment & Plan Note (Signed)
Acute on chronic ? Lumbar radiculopathy vs R hip  Has moderate arthritis of R hip - this may be more that seen on the xray Will check MRI of hip Deferred ortho at this time Would consider PT but wants to make sure there is no tear, etc tylenol or advil prn

## 2019-10-08 NOTE — Assessment & Plan Note (Signed)
Acute on chronic With concern of infection Amoxicillin 500 mg TID x 10 days and dexamethasone x 5 days Continue allergy medication

## 2019-10-08 NOTE — Patient Instructions (Addendum)
An MRI was ordered of your right hip.    Take the amoxicillin and steroid has directed.

## 2019-10-16 ENCOUNTER — Encounter: Payer: Self-pay | Admitting: Internal Medicine

## 2019-10-16 ENCOUNTER — Ambulatory Visit: Payer: Medicare PPO | Admitting: Internal Medicine

## 2019-10-16 ENCOUNTER — Other Ambulatory Visit: Payer: Self-pay

## 2019-10-16 VITALS — BP 148/78 | HR 79 | Temp 98.4°F | Ht 62.0 in | Wt 157.6 lb

## 2019-10-16 DIAGNOSIS — I10 Essential (primary) hypertension: Secondary | ICD-10-CM

## 2019-10-16 MED ORDER — METOPROLOL SUCCINATE ER 50 MG PO TB24: 75.0000 mg | ORAL_TABLET | Freq: Every day | ORAL | 2 refills | Status: DC

## 2019-10-16 NOTE — Assessment & Plan Note (Signed)
Chronic Not well controlled over the past week or so Discussed options We will increase metoprolol to 75 mg XL daily She will continue to monitor her blood pressure at home and if it is not well controlled she will let me know

## 2019-10-16 NOTE — Patient Instructions (Signed)
Increase the metoprolol to 75 mg daily.     Continue to monitor your BP at home.

## 2019-10-16 NOTE — Progress Notes (Signed)
Subjective:    Patient ID: Kimberly Stephenson, female    DOB: 1945-09-03, 74 y.o.   MRN: [id number]  HPI The patient is here for an acute visit.  Recently she was on steroids and an antibiotic for a sinus infection and sciatica.  Prior to this her blood pressure was well controlled at home.  Recently it has been elevated, which is why she is here today.  She is unsure if it is related to her medications or if her blood pressure is just going up for some other reason.  At 1 point she did have a sensation of throbbing and felt her heart beat in the center of her head.  She has not any headaches, lightheadedness or dizziness.  She denies chest pain or palpitations.  elevated BP  - 127/66-164/81     Medications and allergies reviewed with patient and updated if appropriate.  Patient Active Problem List   Diagnosis Date Noted  . Lumbar radiculopathy 07/12/2019  . Right groin pain 07/12/2019  . Allergic rhinitis 04/10/2019  . Mild intermittent reactive airway disease 04/10/2019  . Skin yeast infection 07/13/2018  . Hyperglycemia 07/12/2018  . Hearing loss of right ear 12/15/2016  . Tinnitus 06/04/2016  . Chronic sinusitis 03/23/2016  . Right hip pain 03/23/2016  . Palpitations 03/23/2016  . Vitamin D deficiency 06/08/2015  . 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 NONSPECIFIC ABNORMAL ELECTROCARDIOGRAM 07/19/2008  . Hyperlipemia 12/13/2006  . AMAUROSIS FUGAX 11/30/2006  . Essential hypertension 11/30/2006  . Irritable bowel syndrome 11/30/2006  . OVARIAN CYST 11/30/2006  . Rosacea 11/30/2006    Current Outpatient Medications on File Prior to Visit  Medication Sig Dispense Refill  . albuterol (VENTOLIN HFA) 108 (90 Base) MCG/ACT inhaler Inhale 2 puffs into the lungs every 6 (six) hours as needed for wheezing or shortness of breath. 6.7 g 5  . amoxicillin (AMOXIL) 500 MG capsule Take 1 capsule (500 mg total) by mouth 3 (three) times daily. 30 capsule 0  .  aspirin-acetaminophen-caffeine (EXCEDRIN MIGRAINE) 250-250-65 MG tablet Take 1 tablet by mouth daily. Reported on 04/08/2015    . Cholecalciferol (VITAMIN D3) 25 MCG (1000 UT) CAPS Take 1 capsule (1,000 Units total) by mouth daily. 60 capsule   . Clindamycin-Benzoyl Per, Refr, (DUAC) gel Apply topically 2 (two) times daily.    Marland Kitchen levocetirizine (XYZAL) 5 MG tablet Take 1 tablet (5 mg total) by mouth every evening. 30 tablet 5  . metoprolol succinate (TOPROL-XL) 50 MG 24 hr tablet TAKE 1 TABLET BY MOUTH EVERY DAY WITH OR IMMEDIATELY FOLLOWING A MEAL 90 tablet 2  . mometasone (ELOCON) 0.1 % cream as needed.    . Multiple Vitamins-Calcium (ONE-A-DAY WOMENS PO) Take 1 each by mouth daily.       No current facility-administered medications on file prior to visit.    Past Medical History:  Diagnosis Date  . Amaurosis fugax    x 3 ; resolved with BP meds  . Colitis, ischemic ([place]) 2010   Dr. Earlean Shawl  . Irritable bowel syndrome    Dr Earlean Shawl, GI  . Milk intolerance   . Nonspecific elevation of levels of transaminase or lactic acid dehydrogenase (LDH)   . Other and unspecified hyperlipidemia    [place] study LDL goal=<130; NMR LDL  goal = < 160, ideally < 130  . Other and unspecified ovarian cyst    PMHx  . Polydipsia   . Rosacea   . Sebaceous cyst  left buttock  . Unspecified essential hypertension     Past Surgical History:  Procedure Laterality Date  . APPENDECTOMY  1980  . CESAREAN SECTION     x 2; G2 P2  . COLONOSCOPY  2003, 2008   due 2018; Dr Earlean Shawl  . Peri ovarian cyst right resected  1980  . TONSILLECTOMY AND ADENOIDECTOMY    . UPPER GASTROINTESTINAL ENDOSCOPY  1986    Social History   Socioeconomic History  . Marital status: Divorced    Spouse name: Not on file  . Number of children: Not on file  . Years of education: Not on file  . Highest education level: Not on file  Occupational History  . Occupation: Pharmacist, hospital  Tobacco Use  . Smoking status: Former Smoker     Quit date: 01/04/1969    Years since quitting: 50.8  . Smokeless tobacco: Never Used  . Tobacco comment: smoked [place], up to 2 ppd  Substance and Sexual Activity  . Alcohol use: No    Comment:  very rarely, < 1 glass of wine/ month  . Drug use: No  . Sexual activity: Not on file  Other Topics Concern  . 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 Difficulty of Paying Living Expenses: Not on file  Food Insecurity:   . Worried About Charity fundraiser in the Last Year: Not on file  . Ran Out of Food in the Last Year: Not on file  Transportation Needs:   . Lack of Transportation (Medical): Not on file  . Lack of Transportation (Non-Medical): Not on file  Physical Activity:   . Days of Exercise per Week: Not on file  . Minutes of Exercise per Session: Not on file  Stress:   . Feeling of Stress : Not on file  Social Connections:   . Frequency of Communication with Friends and Family: Not on file  . Frequency of Social Gatherings with Friends and Family: Not on file  . Attends Religious Services: Not on file  . Active Member of Clubs or Organizations: Not on file  . Attends Archivist Meetings: Not on file  . Marital Status: Not on file    Family History  Problem Relation Age of Onset  . Diabetes Father   . Dementia Father   . Heart attack Father 45  . Heart disease Father   . Basal cell carcinoma Mother   . Hypertension Mother 56       Angioplasty  . Lung cancer Mother        Small cell  . Heart attack Mother 41  . Cancer Mother        lung and skin (basal cell carcinoma)  . Diabetes Sister   . Diabetes Maternal Grandfather   . Heart attack Maternal Aunt         X 2;ages 56  & 65  . Lung cancer Maternal Aunt        smoker  . Stroke Paternal Grandfather   . Dementia Paternal Grandfather   . Heart failure Maternal Aunt 62  .  Obesity Sister        x 2  . Thyroid nodules Sister   . Prostate cancer Other        nephew  . Other Other  PTH nodule, nephew  . Diabetes Paternal Grandmother     Review of Systems  Constitutional: Negative for fever.  Respiratory: Negative for shortness of breath.   Cardiovascular: Negative for chest pain, palpitations and leg swelling.  Neurological: Negative for dizziness, light-headedness and headaches.       Objective:   Vitals:   10/16/19 1111  BP: (!) 148/78  Pulse: 79  Temp: 98.4 F (36.9 C)  SpO2: 97%   BP Readings from Last 3 Encounters:  10/16/19 (!) 148/78  10/08/19 128/72  07/17/19 128/80   Wt Readings from Last 3 Encounters:  10/16/19 157 lb 9.6 oz (71.5 kg)  10/08/19 158 lb 6.4 oz (71.8 kg)  07/17/19 157 lb (71.2 kg)   Body mass index is 28.83 kg/m.   Physical Exam    Constitutional: Appears well-developed and well-nourished. No distress.  Head: Normocephalic and atraumatic.  Neck: Neck supple. No tracheal deviation present. No thyromegaly present.  No cervical lymphadenopathy Cardiovascular: Normal rate, regular rhythm and normal heart sounds.  No murmur heard. No carotid bruit .  No edema Pulmonary/Chest: Effort normal and breath sounds normal. No respiratory distress. No has no wheezes. No rales.  Skin: Skin is warm and dry. Not diaphoretic.  Psychiatric: Normal mood and affect. Behavior is normal.      Assessment & Plan:    See Problem List for Assessment and Plan of chronic medical problems.    This visit occurred during the SARS-CoV-2 public health emergency.  Safety protocols were in place, including screening questions prior to the visit, additional usage of staff PPE, and extensive cleaning of exam room while observing appropriate contact time as indicated for disinfecting solutions.

## 2019-10-28 ENCOUNTER — Ambulatory Visit
Admission: RE | Admit: 2019-10-28 | Discharge: 2019-10-28 | Disposition: A | Discharge status: Home / Self Care | Payer: Medicare PPO | Source: Ambulatory Visit | Attending: Internal Medicine | Admitting: Internal Medicine

## 2019-10-28 ENCOUNTER — Other Ambulatory Visit: Payer: Self-pay

## 2019-10-28 DIAGNOSIS — M25551 Pain in right hip: Secondary | ICD-10-CM | POA: Diagnosis not present

## 2019-11-19 ENCOUNTER — Telehealth: Payer: Self-pay | Admitting: Internal Medicine

## 2019-11-19 MED ORDER — METOPROLOL SUCCINATE ER 50 MG PO TB24
75.0000 mg | ORAL_TABLET | Freq: Every day | ORAL | 2 refills | Status: DC
Start: 2019-11-19 — End: 2020-08-14

## 2019-11-19 NOTE — Telephone Encounter (Signed)
metoprolol succinate (TOPROL-XL) 50 MG 24 hr tablet Patient states she needs this refilled because she has been taking a pill and a half because she has been having blood pressure spike issues and the pharmacy is stating its too early so she needs a whole new prescription wrote. [id number] - [address], [address] AT [address] Phone:  [phone]  Fax:  [phone]

## 2020-02-16 ENCOUNTER — Telehealth: Payer: Medicare PPO | Admitting: Family Medicine

## 2020-02-27 NOTE — Progress Notes (Signed)
Virtual Visit via Telephone Note  I connected with Kimberly Stephenson on 02/27/20 at 10:15 AM EST by telephone and verified that I am speaking with the correct person using two identifiers.   I discussed the limitations of evaluation and management by telemedicine and the availability of in person appointments. The patient expressed understanding and agreed to proceed.  Present for the visit:  Myself, Dr Billey Gosling, Lysle Morales.  The patient is currently at home and I am in the office.    No referring provider.    History of Present Illness: This is an acute visit for cough, hoarse voice  She has had three episodes in the past couple of months where it felt like her throat was closing up.  she has a lot of mucus in her throat, worsened cough and a feeling of swelling in her throat.  This can last 4-6 hrs.  She takes her xyzal daily and took benadryl and that seemed to help.   She has some food allergies - not sure if it was related to that.  She has chronic sinusitis- almost always feels like she has a sinus infection.  She had a dry cough for several days.   covid test negative test at home yesterday.   Her sister, nephew and daughter all have nodules in their thyroid.  She is concerned about that causing her symptoms.  She is worried about walking pneumonia.     Hump in aortic notch.  Has been told it was a fat pad but worries about it being something else.    The other night he had extreme pain right of spine below scapula.  Applied heat.  Took advil and pepto bismol.  It is was relieved.  It is extreme.  She worries if it is referred from her hip, stomach, ?    Review of Systems  Constitutional: Negative for fever (low grade).  HENT: Positive for congestion.        PND  Respiratory: Positive for cough (dry), sputum production and shortness of breath (with walking up hills).   Skin: Positive for rash (yeast under breasts/vaginal area).      Social History   Socioeconomic  History  . Marital status: Divorced    Spouse name: Not on file  . Number of children: Not on file  . Years of education: Not on file  . Highest education level: Not on file  Occupational History  . Occupation: Pharmacist, hospital  Tobacco Use  . Smoking status: Former Smoker    Quit date: 01/04/1969    Years since quitting: 51.1  . Smokeless tobacco: Never Used  . Tobacco comment: smoked [place], up to 2 ppd  Substance and Sexual Activity  . Alcohol use: No    Comment:  very rarely, < 1 glass of wine/ month  . Drug use: No  . Sexual activity: Not on file  Other Topics Concern  . 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   Follow  Up Instructions:    I discussed the assessment and treatment plan with the patient. The patient was provided an opportunity to ask questions and all were answered. The patient agreed with the plan and demonstrated an understanding of the instructions.   The patient was advised to call back or seek an in-person evaluation if the symptoms worsen or if the condition fails to improve as anticipated.  Time spent on telephone call - 15 minutes  Binnie Rail, MD

## 2020-02-28 ENCOUNTER — Encounter: Payer: Self-pay | Admitting: Internal Medicine

## 2020-02-28 ENCOUNTER — Telehealth (INDEPENDENT_AMBULATORY_CARE_PROVIDER_SITE_OTHER): Payer: Medicare PPO | Admitting: Internal Medicine

## 2020-02-28 DIAGNOSIS — M1611 Unilateral primary osteoarthritis, right hip: Secondary | ICD-10-CM | POA: Diagnosis not present

## 2020-02-28 DIAGNOSIS — R221 Localized swelling, mass and lump, neck: Secondary | ICD-10-CM | POA: Insufficient documentation

## 2020-02-28 DIAGNOSIS — R059 Cough, unspecified: Secondary | ICD-10-CM

## 2020-02-28 NOTE — Assessment & Plan Note (Signed)
Acute Intermittent feeling of throat closing - sounds like it is related to drainage, so ? Related to allergies, sinus infection, food allergy Improved with xyzal  - continue daily and benadryl - take as needed Family history of thyroid nodules - does not sound like that is causing symptoms - will check thyroid US   Consider ENT referral Has had allergy testing in past --- did consume nuts around one episode which may have triggered one event --- she will monitor to see if there is a relation to food

## 2020-02-28 NOTE — Assessment & Plan Note (Signed)
Acute on chronic Likely related to drainage, chronic sinusitis Continue xyzal Will check cxr - doubt active infection

## 2020-02-29 ENCOUNTER — Ambulatory Visit (INDEPENDENT_AMBULATORY_CARE_PROVIDER_SITE_OTHER): Payer: Medicare PPO

## 2020-02-29 DIAGNOSIS — R059 Cough, unspecified: Secondary | ICD-10-CM | POA: Diagnosis not present

## 2020-03-18 ENCOUNTER — Ambulatory Visit
Admission: RE | Admit: 2020-03-18 | Discharge: 2020-03-18 | Disposition: A | Discharge status: Home / Self Care | Payer: Medicare PPO | Source: Ambulatory Visit | Attending: Internal Medicine | Admitting: Internal Medicine

## 2020-03-18 DIAGNOSIS — R221 Localized swelling, mass and lump, neck: Secondary | ICD-10-CM

## 2020-03-18 DIAGNOSIS — E041 Nontoxic single thyroid nodule: Secondary | ICD-10-CM | POA: Diagnosis not present

## 2020-03-19 ENCOUNTER — Telehealth: Payer: Self-pay | Admitting: Internal Medicine

## 2020-03-19 NOTE — Progress Notes (Unsigned)
Subjective:    Patient ID: Kimberly Stephenson, female    DOB: July 30, 1945, 75 y.o.   MRN: [id number]  HPI The patient is here for follow up of thyroid US and difficulty swallowing.    Before the [place] she had a sensation of pinching down on her throat like she can not breath, swallow or have her voice box pressed on.  She has a feeling of her voice box hurting.    She has a lot of PND.  It is bloody at times.  She has a lot of mucus.    If she takes her allergy medication and a benadryl or two it is relieved.    She wonders if she is eating something that is causing some symptoms.  She had a rash yesterday.  She has some known food allergies      US THYROID CLINICAL DATA:  Other.  Sensation of throat swelling.  EXAM: THYROID ULTRASOUND  TECHNIQUE: Ultrasound examination of the thyroid gland and adjacent soft tissues was performed.  COMPARISON:  None.  FINDINGS: Parenchymal Echotexture: Mildly heterogenous  Isthmus: 0.5 cm  Right lobe: 4.5 x 1.7 x 1.9 cm  Left lobe: 5.5 x 2.6 x 2.3 cm  _________________________________________________________  Estimated total number of nodules >/= 1 cm: 3  Number of spongiform nodules >/=  2 cm not described below (TR1): 0  Number of mixed cystic and solid nodules >/= 1.5 cm not described below (TR2): 0  _________________________________________________________  Nodule # 1:  Location: Right; Superior  Maximum size: 1.1 cm; Other 2 dimensions: 0.9 x 0.5 cm  Composition: solid/almost completely solid (2)  Echogenicity: hypoechoic (2)  Shape: not taller-than-wide (0)  Margins: smooth (0)  Echogenic foci: none (0)  ACR TI-RADS total points: 4.  ACR TI-RADS risk category: TR4 (4-6 points).  ACR TI-RADS recommendations:  *Given size (>/= 1 - 1.4 cm) and appearance, a follow-up ultrasound in 1 year should be considered based on TI-RADS criteria.  _________________________________________________________  Nodule #  2:  Location: Right; Inferior  Maximum size: 1.0 cm; Other 2 dimensions: 0.8 x 0.5 cm  Composition: solid/almost completely solid (2)  Echogenicity: hypoechoic (2)  Shape: not taller-than-wide (0)  Margins: smooth (0)  Echogenic foci: none (0)  ACR TI-RADS total points: 4.  ACR TI-RADS risk category: TR4 (4-6 points).  ACR TI-RADS recommendations:  *Given size (>/= 1 - 1.4 cm) and appearance, a follow-up ultrasound in 1 year should be considered based on TI-RADS criteria.  _________________________________________________________  Nodule # 3:  Location: Left; Inferior  Maximum size: 2.6 cm; Other 2 dimensions: 2.1 x 2.1 cm  Composition: solid/almost completely solid (2)  Echogenicity: isoechoic (1)  Shape: not taller-than-wide (0)  Margins: smooth (0)  Echogenic foci: none (0)  ACR TI-RADS total points: 3.  ACR TI-RADS risk category: TR3 (3 points).  ACR TI-RADS recommendations:  **Given size (>/= 2.5 cm) and appearance, fine needle aspiration of this mildly suspicious nodule should be considered based on TI-RADS criteria.  _________________________________________________________  No abnormal lymph nodes are identified by ultrasound.  IMPRESSION: 1. 2.6 cm left inferior thyroid nodule (nodule 3) meets criteria for fine-needle aspiration. 2. 1.1 cm right superior and 1.0 cm right inferior thyroid nodules meet criteria for 1 year follow-up ultrasound.  The above is in keeping with the ACR TI-RADS recommendations - J Am Coll Radiol 2017;14:587-595.  Electronically Signed   By: Aletta Edouard M.D.   On: 03/18/2020 14:26     Medications and allergies reviewed  with patient and updated if appropriate.  Patient Active Problem List   Diagnosis Date Noted  . Throat swelling 02/28/2020  . Lumbar radiculopathy 07/12/2019  . Right groin pain 07/12/2019  . Allergic rhinitis 04/10/2019  . Mild intermittent reactive airway disease 04/10/2019  . Skin  yeast infection 07/13/2018  . Hyperglycemia 07/12/2018  . Hearing loss of right ear 12/15/2016  . Tinnitus 06/04/2016  . Chronic sinusitis 03/23/2016  . Right hip pain 03/23/2016  . Palpitations 03/23/2016  . Vitamin D deficiency 06/08/2015  . 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 Cough 06/14/2014  . NONSPECIFIC ABNORMAL ELECTROCARDIOGRAM 07/19/2008  . Hyperlipemia 12/13/2006  . AMAUROSIS FUGAX 11/30/2006  . Essential hypertension 11/30/2006  . Irritable bowel syndrome 11/30/2006  . OVARIAN CYST 11/30/2006  . Rosacea 11/30/2006    Current Outpatient Medications on File Prior to Visit  Medication Sig Dispense Refill  . albuterol (VENTOLIN HFA) 108 (90 Base) MCG/ACT inhaler Inhale 2 puffs into the lungs every 6 (six) hours as needed for wheezing or shortness of breath. 6.7 g 5  . aspirin-acetaminophen-caffeine (EXCEDRIN MIGRAINE) 250-250-65 MG tablet Take 1 tablet by mouth daily. Reported on 04/08/2015    . Cholecalciferol (VITAMIN D3) 25 MCG (1000 UT) CAPS Take 1 capsule (1,000 Units total) by mouth daily. 60 capsule   . Clindamycin-Benzoyl Per, Refr, gel Apply topically 2 (two) times daily.    . diphenhydrAMINE HCl (BENADRYL ALLERGY PO) Take by mouth.    . levocetirizine (XYZAL) 5 MG tablet Take 1 tablet (5 mg total) by mouth every evening. 30 tablet 5  . metoprolol succinate (TOPROL-XL) 50 MG 24 hr tablet Take 1.5 tablets (75 mg total) by mouth daily. 135 tablet 2  . mometasone (ELOCON) 0.1 % cream as needed.    . Multiple Vitamins-Calcium (ONE-A-DAY WOMENS PO) Take 1 each by mouth daily.       No current facility-administered medications on file prior to visit.    Past Medical History:  Diagnosis Date  . Amaurosis fugax    x 3 ; resolved with BP meds  . Colitis, ischemic ([place]) 2010   Dr. Earlean Shawl  . Irritable bowel syndrome    Dr Earlean Shawl, GI  . Milk intolerance   . Nonspecific elevation of levels of transaminase or lactic acid dehydrogenase (LDH)   .  Other and unspecified hyperlipidemia    [place] study LDL goal=<130; NMR LDL  goal = < 160, ideally < 130  . Other and unspecified ovarian cyst    PMHx  . Polydipsia   . Rosacea   . Sebaceous cyst     left buttock  . Unspecified essential hypertension     Past Surgical History:  Procedure Laterality Date  . APPENDECTOMY  1980  . CESAREAN SECTION     x 2; G2 P2  . COLONOSCOPY  2003, 2008   due 2018; Dr Earlean Shawl  . Peri ovarian cyst right resected  1980  . TONSILLECTOMY AND ADENOIDECTOMY    . UPPER GASTROINTESTINAL ENDOSCOPY  1986    Social History   Socioeconomic History  . Marital status: Divorced    Spouse name: Not on file  . Number of children: Not on file  . Years of education: Not on file  . Highest education level: Not on file  Occupational History  . Occupation: Pharmacist, hospital  Tobacco Use  . Smoking status: Former Smoker    Quit date: 01/04/1969    Years since quitting: 51.2  . Smokeless tobacco: Never Used  .  Tobacco comment: smoked [place], up to 2 ppd  Substance and Sexual Activity  . Alcohol use: No    Comment:  very rarely, < 1 glass of wine/ month  . Drug use: No  . Sexual activity: Not on file  Other Topics Concern  . 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 Diabetes Father   . Dementia Father   . Heart attack Father 2  . Heart disease Father   . Basal cell carcinoma Mother   . Hypertension Mother 51       Angioplasty  . Lung cancer Mother        Small cell  . Heart attack Mother 63  . Cancer Mother        lung and skin (basal cell carcinoma)  . Diabetes Sister   . Diabetes Maternal Grandfather   .  Heart attack Maternal Aunt         X 2;ages 56  & 65  . Lung cancer Maternal Aunt        smoker  . Stroke Paternal Grandfather   . Dementia Paternal Grandfather   . Heart failure Maternal Aunt 62  . Obesity Sister        x 2  . Thyroid nodules Sister   . Prostate cancer Other        nephew  . Other Other        PTH nodule, nephew  . Diabetes Paternal Grandmother     Review of Systems  Constitutional: Negative for fever.  HENT: Positive for postnasal drip.   Respiratory: Positive for cough.   Gastrointestinal:       No gerd       Objective:   Vitals:   03/20/20 0948  BP: 132/78  Pulse: 69  Temp: 98 F (36.7 C)  SpO2: 97%   BP Readings from Last 3 Encounters:  03/20/20 132/78  10/16/19 (!) 148/78  10/08/19 128/72   Wt Readings from Last 3 Encounters:  03/20/20 158 lb 3.2 oz (71.8 kg)  10/16/19 157 lb 9.6 oz (71.5 kg)  10/08/19 158 lb 6.4 oz (71.8 kg)   Body mass index is 28.94 kg/m.   Physical Exam    Constitutional: Appears well-developed and well-nourished. No distress.  HENT:  Head: Normocephalic and atraumatic.  Neck: Neck supple. No tracheal deviation present. Possible left inferior nodule felt in thyroid.  No cervical lymphadenopathy Cardiovascular: Normal rate, regular rhythm and normal heart sounds.   No murmur heard. No carotid bruit .  No edema Pulmonary/Chest: Effort normal and breath sounds normal. No respiratory distress. No has no wheezes. No rales.  Skin: Skin is warm and dry. Not diaphoretic.  Psychiatric: Normal mood and affect. Behavior is normal.      Assessment & Plan:    See Problem List for Assessment and Plan of chronic medical problems.    This visit occurred during the SARS-CoV-2 public health emergency.  Safety protocols were in place, including screening questions prior to the visit, additional usage of staff PPE, and extensive cleaning of exam room while  observing appropriate contact time as indicated for disinfecting  solutions.

## 2020-03-19 NOTE — Telephone Encounter (Signed)
Ok to see her tomorrow - if difficulty swallowing gets worse - needs to be evaluated sooner but I do not think that will happen.  We will review her [place] tomorrow

## 2020-03-19 NOTE — Telephone Encounter (Signed)
Message left for patient to seek medical attention if symptoms got worse. Otherwise plan to keep appointment for tomorrow.

## 2020-03-19 NOTE — Telephone Encounter (Signed)
Patient called and said that she had an ultr sound done of her thyroid yesterday. She said it was very painful. She said she is having trouble swallowing and feels like she might choke. Tried to transfer to triage but she declined and said she didn't want to go to the urgent care or ER. She is scheduled with Dr. Quay Burow tomorrow morning. Please advise.    Phone: [phone]

## 2020-03-19 NOTE — Patient Instructions (Addendum)
   Medications changes include :   none   A referral was ordered for Allergy and ENT.       Someone from their office will call you to schedule an appointment.

## 2020-03-20 ENCOUNTER — Ambulatory Visit: Payer: Medicare PPO | Admitting: Internal Medicine

## 2020-03-20 ENCOUNTER — Other Ambulatory Visit: Payer: Self-pay

## 2020-03-20 ENCOUNTER — Ambulatory Visit: Payer: Medicare PPO

## 2020-03-20 ENCOUNTER — Encounter: Payer: Self-pay | Admitting: Internal Medicine

## 2020-03-20 VITALS — BP 132/78 | HR 69 | Temp 98.0°F | Ht 62.0 in | Wt 158.2 lb

## 2020-03-20 DIAGNOSIS — R059 Cough, unspecified: Secondary | ICD-10-CM

## 2020-03-20 DIAGNOSIS — Z91018 Allergy to other foods: Secondary | ICD-10-CM | POA: Diagnosis not present

## 2020-03-20 DIAGNOSIS — E042 Nontoxic multinodular goiter: Secondary | ICD-10-CM | POA: Diagnosis not present

## 2020-03-20 NOTE — Assessment & Plan Note (Signed)
Chronic ? Allergies, asthma, gerd Denies gerd Will get input from allergy and ENT

## 2020-03-20 NOTE — Assessment & Plan Note (Signed)
New US revealed 3 nodules - 1 to be biopsied, 1 to be monitored Will refer to ENT for there evaluation and biopsy

## 2020-03-20 NOTE — Assessment & Plan Note (Signed)
Chronic Has known food allergies from testing in past Now have intermittent rashes and throat symptoms and wonders if it is related to allergies Will refer to allergy

## 2020-03-24 ENCOUNTER — Other Ambulatory Visit: Payer: Self-pay | Admitting: Internal Medicine

## 2020-03-25 ENCOUNTER — Encounter: Payer: Self-pay | Admitting: Internal Medicine

## 2020-03-25 ENCOUNTER — Other Ambulatory Visit: Payer: Medicare PPO

## 2020-03-25 DIAGNOSIS — Z91018 Allergy to other foods: Secondary | ICD-10-CM

## 2020-03-25 DIAGNOSIS — R140 Abdominal distension (gaseous): Secondary | ICD-10-CM

## 2020-03-26 DIAGNOSIS — R198 Other specified symptoms and signs involving the digestive system and abdomen: Secondary | ICD-10-CM | POA: Diagnosis not present

## 2020-03-26 DIAGNOSIS — K219 Gastro-esophageal reflux disease without esophagitis: Secondary | ICD-10-CM | POA: Diagnosis not present

## 2020-04-04 LAB — CELIAC AB TTG DGP TIGA
Antigliadin Abs, IgA: 4 units (ref 0–19)
Gliadin IgG: 2 units (ref 0–19)
IgA/Immunoglobulin A, Serum: 228 mg/dL (ref 64–422)
Tissue Transglut Ab: <2 U/mL (ref 0–5)
Transglutaminase IgA: <2 U/mL (ref 0–3)

## 2020-04-04 LAB — CELIAC HLA RFLX TO ABS
DQ2 (DQA1 0501/0505,DQB1 02XX): NEGATIVE
DQ8 (DQA1 03XX, DQB1 0302): POSITIVE

## 2020-04-04 LAB — RETICULIN ANTIBODIES, IGA W TITER: Reticulin Ab, IgA: NEGATIVE titer (ref <2.5)

## 2020-04-08 ENCOUNTER — Encounter: Payer: Self-pay | Admitting: Internal Medicine

## 2020-04-09 DIAGNOSIS — L821 Other seborrheic keratosis: Secondary | ICD-10-CM | POA: Diagnosis not present

## 2020-04-09 DIAGNOSIS — L820 Inflamed seborrheic keratosis: Secondary | ICD-10-CM | POA: Diagnosis not present

## 2020-04-09 DIAGNOSIS — L718 Other rosacea: Secondary | ICD-10-CM | POA: Diagnosis not present

## 2020-04-09 DIAGNOSIS — D1801 Hemangioma of skin and subcutaneous tissue: Secondary | ICD-10-CM | POA: Diagnosis not present

## 2020-04-09 DIAGNOSIS — L812 Freckles: Secondary | ICD-10-CM | POA: Diagnosis not present

## 2020-04-09 DIAGNOSIS — B372 Candidiasis of skin and nail: Secondary | ICD-10-CM | POA: Diagnosis not present

## 2020-04-09 DIAGNOSIS — L304 Erythema intertrigo: Secondary | ICD-10-CM | POA: Diagnosis not present

## 2020-04-21 DIAGNOSIS — E041 Nontoxic single thyroid nodule: Secondary | ICD-10-CM | POA: Insufficient documentation

## 2020-04-21 DIAGNOSIS — R0989 Other specified symptoms and signs involving the circulatory and respiratory systems: Secondary | ICD-10-CM | POA: Diagnosis not present

## 2020-04-21 DIAGNOSIS — K219 Gastro-esophageal reflux disease without esophagitis: Secondary | ICD-10-CM | POA: Diagnosis not present

## 2020-04-21 DIAGNOSIS — E042 Nontoxic multinodular goiter: Secondary | ICD-10-CM | POA: Insufficient documentation

## 2020-04-21 DIAGNOSIS — R09A2 Foreign body sensation, throat: Secondary | ICD-10-CM | POA: Insufficient documentation

## 2020-04-24 ENCOUNTER — Ambulatory Visit: Payer: Self-pay | Admitting: Allergy

## 2020-05-05 ENCOUNTER — Other Ambulatory Visit: Payer: Self-pay | Admitting: Otolaryngology

## 2020-05-05 DIAGNOSIS — E041 Nontoxic single thyroid nodule: Secondary | ICD-10-CM

## 2020-05-09 ENCOUNTER — Other Ambulatory Visit (HOSPITAL_COMMUNITY)
Admission: RE | Admit: 2020-05-09 | Discharge: 2020-05-09 | Disposition: A | Discharge status: Home / Self Care | Payer: Medicare PPO | Source: Ambulatory Visit | Attending: Radiology | Admitting: Radiology

## 2020-05-09 ENCOUNTER — Ambulatory Visit
Admission: RE | Admit: 2020-05-09 | Discharge: 2020-05-09 | Disposition: A | Discharge status: Home / Self Care | Payer: Medicare PPO | Source: Ambulatory Visit | Attending: Otolaryngology | Admitting: Otolaryngology

## 2020-05-09 ENCOUNTER — Other Ambulatory Visit: Payer: Self-pay

## 2020-05-09 ENCOUNTER — Ambulatory Visit: Payer: Self-pay | Admitting: Allergy

## 2020-05-09 DIAGNOSIS — D440 Neoplasm of uncertain behavior of thyroid gland: Secondary | ICD-10-CM | POA: Insufficient documentation

## 2020-05-09 DIAGNOSIS — E041 Nontoxic single thyroid nodule: Secondary | ICD-10-CM

## 2020-05-09 DIAGNOSIS — R896 Abnormal cytological findings in specimens from other organs, systems and tissues: Secondary | ICD-10-CM | POA: Diagnosis not present

## 2020-05-12 LAB — CYTOLOGY - NON PAP

## 2020-05-13 DIAGNOSIS — K219 Gastro-esophageal reflux disease without esophagitis: Secondary | ICD-10-CM | POA: Diagnosis not present

## 2020-05-13 DIAGNOSIS — R109 Unspecified abdominal pain: Secondary | ICD-10-CM | POA: Diagnosis not present

## 2020-05-15 DIAGNOSIS — R109 Unspecified abdominal pain: Secondary | ICD-10-CM | POA: Diagnosis not present

## 2020-05-20 DIAGNOSIS — H2513 Age-related nuclear cataract, bilateral: Secondary | ICD-10-CM | POA: Diagnosis not present

## 2020-05-20 DIAGNOSIS — H47323 Drusen of optic disc, bilateral: Secondary | ICD-10-CM | POA: Diagnosis not present

## 2020-05-20 DIAGNOSIS — E041 Nontoxic single thyroid nodule: Secondary | ICD-10-CM | POA: Diagnosis not present

## 2020-05-20 DIAGNOSIS — H34231 Retinal artery branch occlusion, right eye: Secondary | ICD-10-CM | POA: Diagnosis not present

## 2020-05-20 DIAGNOSIS — H1045 Other chronic allergic conjunctivitis: Secondary | ICD-10-CM | POA: Diagnosis not present

## 2020-05-20 DIAGNOSIS — H04123 Dry eye syndrome of bilateral lacrimal glands: Secondary | ICD-10-CM | POA: Diagnosis not present

## 2020-05-23 ENCOUNTER — Encounter: Payer: Self-pay | Admitting: Internal Medicine

## 2020-05-26 NOTE — Progress Notes (Signed)
Subjective:    Patient ID: Kimberly Stephenson, female    DOB: July 08, 1945, 75 y.o.   MRN: [id number]  HPI The patient is here for an acute visit.   Discuss biopsy results of thyroid - thyroid FNA by ENT her biopsy shows atypia of undetermined significance.  She is unsure if the sample was sent to [place] for molecular studies.  She is trying to find this out from their office and has not been successful.  She knows that the protocol for some ENT, but she is not sure if it happened or not.  She is interested in seeing an endocrinologist.  She does not want to go back to this to ENT.    Medications and allergies reviewed with patient and updated if appropriate.  Patient Active Problem List   Diagnosis Date Noted  . Food allergy 03/20/2020  . Multiple thyroid nodules 03/20/2020  . Throat swelling 02/28/2020  . Lumbar radiculopathy 07/12/2019  . Right groin pain 07/12/2019  . Allergic rhinitis 04/10/2019  . Mild intermittent reactive airway disease 04/10/2019  . Skin yeast infection 07/13/2018  . Hyperglycemia 07/12/2018  . Hearing loss of right ear 12/15/2016  . Tinnitus 06/04/2016  . Chronic sinusitis 03/23/2016  . Right hip pain 03/23/2016  . Palpitations 03/23/2016  . Vitamin D deficiency 06/08/2015  . 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 Cough 06/14/2014  . NONSPECIFIC ABNORMAL ELECTROCARDIOGRAM 07/19/2008  . Hyperlipemia 12/13/2006  . AMAUROSIS FUGAX 11/30/2006  . Essential hypertension 11/30/2006  . Irritable bowel syndrome 11/30/2006  . OVARIAN CYST 11/30/2006  . Rosacea 11/30/2006    Current Outpatient Medications on File Prior to Visit  Medication Sig Dispense Refill  . albuterol (VENTOLIN HFA) 108 (90 Base) MCG/ACT inhaler Inhale 2 puffs into the lungs every 6 (six) hours as needed for wheezing or shortness of breath. 6.7 g 5  . aspirin-acetaminophen-caffeine (EXCEDRIN MIGRAINE) 250-250-65 MG tablet Take 1 tablet by mouth daily.  Reported on 04/08/2015    . Cholecalciferol (VITAMIN D3) 25 MCG (1000 UT) CAPS Take 1 capsule (1,000 Units total) by mouth daily. 60 capsule   . Clindamycin-Benzoyl Per, Refr, gel Apply topically 2 (two) times daily.    . diphenhydrAMINE HCl (BENADRYL ALLERGY PO) Take by mouth.    . famotidine (PEPCID) 40 MG tablet Take by mouth.    Mar[place] Kitchen ketoconazole (NIZORAL) 2 % cream APPLY TOPICALLY TO THE AFFECTED AREA TWICE DAILY    . levocetirizine (XYZAL) 5 MG tablet TAKE 1 TABLET(5 MG) BY MOUTH EVERY EVENING 30 tablet 5  . metoprolol succinate (TOPROL-XL) 50 MG 24 hr tablet Take 1.5 tablets (75 mg total) by mouth daily. 135 tablet 2  . mometasone (ELOCON) 0.1 % cream as needed.    . Multiple Vitamins-Calcium (ONE-A-DAY WOMENS PO) Take 1 each by mouth daily.       No current facility-administered medications on file prior to visit.    Past Medical History:  Diagnosis Date  . Amaurosis fugax    x 3 ; resolved with BP meds  . Colitis, ischemic ([place]) 2010   Dr. Earlean Shawl  . Irritable bowel syndrome    Dr Earlean Shawl, GI  . Milk intolerance   . Nonspecific elevation of levels of transaminase or lactic acid dehydrogenase (LDH)   . Other and unspecified hyperlipidemia    [place] study LDL goal=<130; NMR LDL  goal = < 160, ideally < 130  . Other and unspecified ovarian cyst    PMHx  . Polydipsia   .  Rosacea   . Sebaceous cyst     left buttock  . Unspecified essential hypertension     Past Surgical History:  Procedure Laterality Date  . APPENDECTOMY  1980  . CESAREAN SECTION     x 2; G2 P2  . COLONOSCOPY  2003, 2008   due 2018; Dr Earlean Shawl  . Peri ovarian cyst right resected  1980  . TONSILLECTOMY AND ADENOIDECTOMY    . UPPER GASTROINTESTINAL ENDOSCOPY  1986    Social History   Socioeconomic History  . Marital status: Divorced    Spouse name: Not on file  . Number of children: Not on file  . Years of education: Not on file  . Highest education level: Not on file  Occupational History  .  Occupation: Pharmacist, hospital  Tobacco Use  . Smoking status: Former Smoker    Quit date: 01/04/1969    Years since quitting: 51.4  . Smokeless tobacco: Never Used  . Tobacco comment: smoked [place], up to 2 ppd  Substance and Sexual Activity  . Alcohol use: No    Comment:  very rarely, < 1 glass of wine/ month  . Drug use: No  . Sexual activity: Not on file  Other Topics Concern  . 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 Diabetes Father   . Dementia Father   . Heart attack Father 30  . Heart disease Father   . Basal cell carcinoma Mother   . Hypertension Mother 69       Angioplasty  . Lung cancer Mother        Small cell  . Heart attack Mother 46  . Cancer Mother        lung and skin (basal cell carcinoma)  . Diabetes Sister   . Diabetes Maternal Grandfather   . Heart attack Maternal Aunt         X 2;ages 56  & 65  . Lung cancer Maternal Aunt        smoker  . Stroke Paternal Grandfather   . Dementia Paternal Grandfather   . Heart failure Maternal Aunt 62  . Obesity Sister        x 2  . Thyroid nodules Sister   . Prostate cancer Other        nephew  . Other Other        PTH nodule, nephew  . Diabetes Paternal Grandmother     Review of Systems     Objective:   Vitals:   05/27/20 1043  BP: (!) 142/72  Pulse: 68  Temp: 98.4 F (36.9 C)  SpO2: 98%   BP Readings from Last 3 Encounters:  05/27/20 (!) 142/72  03/20/20 132/78  10/16/19 (!) 148/78   Wt Readings from Last 3 Encounters:  05/27/20 158 lb 3.2 oz (71.8 kg)  03/20/20 158 lb 3.2 oz (71.8 kg)  10/16/19 157 lb 9.6 oz (71.5 kg)   Body mass index is 28.94 kg/m.    Physical Exam         Assessment & Plan:  See Problem List for Assessment and Plan of chronic medical problems.    This visit occurred during the SARS-CoV-2 public health emergency.  Safety protocols were in place, including screening questions prior to the visit, additional usage of staff PPE, and extensive cleaning of exam room while observing appropriate contact time as indicated for disinfecting solutions.

## 2020-05-26 NOTE — Patient Instructions (Addendum)
      A referral was ordered for Endocrine - Dr Cruzita Lederer.       Someone from their office will call you to schedule an appointment.

## 2020-05-27 ENCOUNTER — Encounter: Payer: Self-pay | Admitting: Internal Medicine

## 2020-05-27 ENCOUNTER — Ambulatory Visit: Payer: Medicare PPO | Admitting: Internal Medicine

## 2020-05-27 ENCOUNTER — Telehealth: Payer: Self-pay

## 2020-05-27 ENCOUNTER — Other Ambulatory Visit: Payer: Self-pay

## 2020-05-27 ENCOUNTER — Telehealth: Payer: Self-pay | Admitting: Internal Medicine

## 2020-05-27 VITALS — BP 142/72 | HR 68 | Temp 98.4°F | Ht 62.0 in | Wt 158.2 lb

## 2020-05-27 DIAGNOSIS — E042 Nontoxic multinodular goiter: Secondary | ICD-10-CM | POA: Diagnosis not present

## 2020-05-27 NOTE — Telephone Encounter (Signed)
Patient called and said that Dr. Danie Binder office called her and said that the biopsy was sent to [place] and that Dr. Quay Burow did not need to call them. Please advise

## 2020-05-27 NOTE — Assessment & Plan Note (Signed)
Thyroid biopsy done 05/09/2020-path showed atypia of undetermined significance ?  Specimen sent to [facility] for molecular testing-we will try to find this out from ENT since she has not been able to She would like to see endocrine to discuss this-referral ordered for [facility]

## 2020-05-27 NOTE — Telephone Encounter (Signed)
-----   Message from Binnie Rail, MD sent at 05/27/2020 12:50 PM EDT ----- Huge favor-can you call Dr. Victorio Palm office and ask them if he did send off her thyroid specimen to [facility] for molecular testing

## 2020-05-30 ENCOUNTER — Encounter (HOSPITAL_COMMUNITY): Payer: Self-pay

## 2020-08-13 ENCOUNTER — Other Ambulatory Visit: Payer: Self-pay | Admitting: Internal Medicine

## 2020-08-20 ENCOUNTER — Telehealth: Payer: Self-pay | Admitting: Internal Medicine

## 2020-08-20 NOTE — Telephone Encounter (Signed)
Pt is requesting a urine culture. She is having burning, frequent urination, and mild pain. Offered an appointment but pt declined.   Please advise.

## 2020-08-20 NOTE — Telephone Encounter (Signed)
My concern is who will follow this up?

## 2020-08-21 NOTE — Telephone Encounter (Signed)
Message left for patient today.  If she calls back please let her know Dr. Quay Burow is out of the office until Monday and I will not be able to order the culture without her being evaluated.  Please schedule appointment.  Angela Nevin

## 2020-08-22 ENCOUNTER — Other Ambulatory Visit: Payer: Self-pay | Admitting: Internal Medicine

## 2020-08-25 ENCOUNTER — Ambulatory Visit: Payer: Medicare PPO | Admitting: Internal Medicine

## 2020-09-23 ENCOUNTER — Telehealth: Payer: Self-pay | Admitting: Internal Medicine

## 2020-09-23 ENCOUNTER — Other Ambulatory Visit: Payer: Self-pay

## 2020-09-23 DIAGNOSIS — R300 Dysuria: Secondary | ICD-10-CM

## 2020-09-23 NOTE — Telephone Encounter (Signed)
Spoke with patient today.  Urine culture and UA put in for tomorrow. Patient advised on increasing blood pressure meds to 50 mg in the morning and 50 mg at night and to watch for heart-rate less than 60.

## 2020-09-23 NOTE — Telephone Encounter (Signed)
Patient calling in about bp concerns & possiblt UTI  Advised patient provider did not have available appts today but has same day appt available for tomorrow @ 3:40.. made pat aware she would need to call in 1st thing tomorrow morning in order to schedule same day appt  Patient requesting call back from nurse

## 2020-09-24 ENCOUNTER — Other Ambulatory Visit: Payer: Self-pay

## 2020-09-24 ENCOUNTER — Other Ambulatory Visit: Payer: Medicare PPO

## 2020-09-24 DIAGNOSIS — R300 Dysuria: Secondary | ICD-10-CM

## 2020-09-24 LAB — URINALYSIS, ROUTINE W REFLEX MICROSCOPIC
Bilirubin Urine: NEGATIVE
Hgb urine dipstick: NEGATIVE
Ketones, ur: NEGATIVE
Nitrite: NEGATIVE
Specific Gravity, Urine: 1.01 (ref 1.000–1.030)
Total Protein, Urine: NEGATIVE
Urine Glucose: NEGATIVE
Urobilinogen, UA: 0.2 — AB (ref 0.0–1.0)
pH: 6 (ref 5.0–8.0)

## 2020-09-25 NOTE — Progress Notes (Signed)
Subjective:    Patient ID: Kimberly Stephenson, female    DOB: 25-Jul-1945, 75 y.o.   MRN: [id number]  This visit occurred during the SARS-CoV-2 public health emergency.  Safety protocols were in place, including screening questions prior to the visit, additional usage of staff PPE, and extensive cleaning of exam room while observing appropriate contact time as indicated for disinfecting solutions.    HPI The patient is here for an acute visit.   having increased stress - her dog is sick and she is up several times a night and she thinks that is causing her BP to be up.  She is also buying a second house in [place].    Elevated BP - 9/20 - 182/87 at 8:50 am  increased metoprolol xl to 100 mg daily.BP still high at times.  No concerning symptoms.    Urinary symptoms - 3 weeks of mild pain and stinging. Not drinking enough fluids.  No blood.     Medications and allergies reviewed with patient and updated if appropriate.  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 Reported on 04/08/2015     Cholecalciferol (VITAMIN D3) 25 MCG (1000 UT) CAPS Take 1 capsule (1,000 Units total) by mouth daily. 60 capsule    Clindamycin-Benzoyl Per, Refr, gel Apply topically 2 (two) times daily.     diphenhydrAMINE HCl (BENADRYL ALLERGY PO) Take by mouth.     ketoconazole (NIZORAL) 2 % cream APPLY TOPICALLY TO THE AFFECTED AREA TWICE DAILY     levocetirizine (XYZAL) 5 MG tablet TAKE 1 TABLET(5 MG) BY MOUTH EVERY EVENING 30 tablet 5   metoprolol succinate (TOPROL-XL) 50 MG 24 hr tablet TAKE 1 AND 1/2 TABLETS(75 MG) BY MOUTH DAILY 135 tablet 2   mometasone (ELOCON) 0.1 % cream as needed.     Multiple Vitamins-Calcium (ONE-A-DAY WOMENS PO) Take 1 each by mouth daily.       No current facility-administered medications on file prior to visit.    Past Medical History:  Diagnosis Date   Amaurosis fugax    x 3 ; resolved with BP meds   Colitis, ischemic ([place]) 2010   Dr. Earlean Shawl   Irritable bowel syndrome    Dr Earlean Shawl, GI   Milk intolerance    Nonspecific elevation of levels of transaminase or lactic acid dehydrogenase (LDH)    Other and unspecified hyperlipidemia    [place] study LDL goal=<130; NMR LDL  goal = < 160, ideally < 130   Other and unspecified ovarian cyst    PMHx  Polydipsia    Rosacea    Sebaceous cyst     left buttock   Unspecified essential hypertension     Past Surgical History:  Procedure Laterality Date   APPENDECTOMY  1980   CESAREAN SECTION     x 2; G2 P2   COLONOSCOPY  2003, 2008   due 2018; Dr Earlean Shawl   Peri ovarian cyst right resected  1980   TONSILLECTOMY AND ADENOIDECTOMY     UPPER GASTROINTESTINAL ENDOSCOPY  1986    Social History   Socioeconomic History   Marital status: Divorced    Spouse name: Not on file   Number of children: Not on file   Years of education: Not on file   Highest education level: Not on file  Occupational History   Occupation:  Teacher  Tobacco Use   Smoking status: Former    Types: Cigarettes    Quit date: 01/04/1969    Years since quitting: 51.7   Smokeless tobacco: Never   Tobacco comments:    smoked [place], up to 2 ppd  Substance and Sexual Activity   Alcohol use: No    Comment:  very rarely, < 1 glass of wine/ month   Drug use: No   Sexual activity: Not on file  Other Topics Concern   Not on file  Social History Narrative   Modified [place]      Teacher      Walks 30 minutes daily   Social Determinants of Health   Financial Resource Strain: Not on file  Food Insecurity: Not on file  Transportation Needs: Not on file  Physical Activity: Not on file  Stress: Not on file  Social Connections: Not on file    Family History  Problem Relation Age of Onset   Diabetes Father    Dementia Father    Heart attack Father 34   Heart disease Father    Basal cell carcinoma Mother    Hypertension Mother 31       Angioplasty   Lung cancer Mother        Small cell   Heart attack Mother 92   Cancer Mother        lung and skin (basal cell carcinoma)   Diabetes Sister    Diabetes Maternal Grandfather    Heart attack Maternal Aunt         X 2;ages 69  & 60   Lung cancer Maternal Aunt        smoker   Stroke Paternal Grandfather    Dementia Paternal Grandfather    Heart failure Maternal Aunt 62   Obesity Sister        x 2   Thyroid nodules Sister    Prostate cancer Other        nephew   Other Other        PTH nodule, nephew   Diabetes Paternal Grandmother     Review of Systems  Constitutional:  Positive for fatigue. Negative for fever.  Respiratory:  Negative for shortness of breath.   Cardiovascular:  Negative for chest pain, palpitations and leg swelling.  Genitourinary:  Positive for dysuria (mild). Negative for vaginal bleeding and vaginal discharge.  Neurological:  Positive for headaches. Negative for dizziness and light-headedness.      Objective:   Vitals:   09/26/20 1413   BP: (!) 142/82  Pulse: 82  Temp: 98.6 F (37 C)  SpO2: 96%   BP Readings from Last 3 Encounters:  09/26/20 (!) 142/82  05/27/20 (!) 142/72  03/20/20 132/78   Wt Readings from Last 3 Encounters:  09/26/20 157 lb (71.2 kg)  05/27/20 158 lb 3.2 oz (71.8 kg)  03/20/20 158 lb 3.2 oz (71.8 kg)   Body mass index is 28.72 kg/m.   Physical Exam    Constitutional: Appears well-developed and well-nourished. No distress.  Head: Normocephalic and atraumatic.  Neck: Neck supple. No tracheal deviation present. No thyromegaly present.  No cervical lymphadenopathy Cardiovascular: Normal rate, regular rhythm and normal heart sounds.  No murmur heard. No carotid bruit .  No edema Pulmonary/Chest: Effort normal and breath sounds normal. No respiratory distress. No has no wheezes. No rales.  Skin: Skin is warm and dry. Not diaphoretic.  Psychiatric: Normal mood and affect. Behavior is normal.       Assessment & Plan:    See Problem List for Assessment and Plan of chronic medical problems.

## 2020-09-26 ENCOUNTER — Ambulatory Visit: Payer: Medicare PPO | Admitting: Internal Medicine

## 2020-09-26 ENCOUNTER — Other Ambulatory Visit: Payer: Self-pay

## 2020-09-26 ENCOUNTER — Encounter: Payer: Self-pay | Admitting: Internal Medicine

## 2020-09-26 VITALS — BP 142/82 | HR 82 | Temp 98.6°F | Ht 62.0 in | Wt 157.0 lb

## 2020-09-26 DIAGNOSIS — R300 Dysuria: Secondary | ICD-10-CM | POA: Diagnosis not present

## 2020-09-26 DIAGNOSIS — I10 Essential (primary) hypertension: Secondary | ICD-10-CM | POA: Diagnosis not present

## 2020-09-26 MED ORDER — METOPROLOL SUCCINATE ER 50 MG PO TB24: 75.0000 mg | ORAL_TABLET | Freq: Two times a day (BID) | ORAL | 2 refills | Status: DC

## 2020-09-26 NOTE — Patient Instructions (Addendum)
     Medications changes include :   metoprolol 75 mg twice daily  Your prescription(s) have been submitted to your pharmacy. Please take as directed and contact our office if you believe you are having problem(s) with the medication(s).

## 2020-09-27 DIAGNOSIS — R300 Dysuria: Secondary | ICD-10-CM | POA: Insufficient documentation

## 2020-09-27 NOTE — Assessment & Plan Note (Signed)
Acute UA indeterminate - will wait for culture to see if she needs treatment Increase fluids, drinking cranberry juice.

## 2020-09-27 NOTE — Assessment & Plan Note (Signed)
Chronic Not controlled -- may be related to increased stress and not sleeping, but we still need to treat it Increase metoprolol to 75 mg bid Monitor BP at home

## 2020-09-28 LAB — CULTURE, URINE COMPREHENSIVE

## 2020-09-28 MED ORDER — NITROFURANTOIN MONOHYD MACRO 100 MG PO CAPS: 100.0000 mg | ORAL_CAPSULE | Freq: Two times a day (BID) | ORAL | 0 refills | Status: DC

## 2020-09-29 ENCOUNTER — Encounter: Payer: Self-pay | Admitting: Internal Medicine

## 2020-10-01 ENCOUNTER — Other Ambulatory Visit: Payer: Self-pay | Admitting: Internal Medicine

## 2020-10-01 MED ORDER — CLONAZEPAM 0.5 MG PO TABS: 0.2500 mg | ORAL_TABLET | Freq: Two times a day (BID) | ORAL | 0 refills | Status: DC | PRN

## 2020-10-08 ENCOUNTER — Encounter: Payer: Self-pay | Admitting: Internal Medicine

## 2020-10-09 ENCOUNTER — Other Ambulatory Visit: Payer: Self-pay

## 2020-10-09 ENCOUNTER — Other Ambulatory Visit: Payer: Medicare PPO

## 2020-10-09 DIAGNOSIS — R300 Dysuria: Secondary | ICD-10-CM | POA: Diagnosis not present

## 2020-10-11 LAB — CULTURE, URINE COMPREHENSIVE: RESULT:: NO GROWTH

## 2020-10-21 ENCOUNTER — Telehealth: Payer: Self-pay | Admitting: Internal Medicine

## 2020-10-21 NOTE — Telephone Encounter (Signed)
Patient says she spoke w/ pharmacy & they advised her that a new script & PA was needed for metoprolol succinate (TOPROL-XL) 50 MG 24 hr tablet  Patient says she is going to be out of medication next Tuesday (10/25).. says she called in about this previously in September   Pharmacy:[facility] [place], [address] AT [address]  Phone:  [phone] Fax:  [phone]

## 2020-10-23 ENCOUNTER — Other Ambulatory Visit: Payer: Self-pay

## 2020-10-23 MED ORDER — METOPROLOL SUCCINATE ER 50 MG PO TB24: 75.0000 mg | ORAL_TABLET | Freq: Two times a day (BID) | ORAL | 2 refills | Status: DC

## 2020-10-23 NOTE — Telephone Encounter (Signed)
Kimberly Stephenson (Key: [id number])  Your information has been sent to [facility].  PA started today but daily allowance for medication is 118 for dispensing amount.

## 2020-10-23 NOTE — Telephone Encounter (Signed)
Lysle Morales Key: [id number] - PA Case ID: [id number] Need help?   Call us at [phone]  Outcome: Approved today  PA Case: [id number],   Status: Approved  Coverage Starts on: 01/05/2020 12:00:00 AM,   Coverage Ends on: 01/03/2022 12:00:00 AM.   Questions? Contact [phone].

## 2020-12-01 ENCOUNTER — Encounter: Payer: Self-pay | Admitting: Internal Medicine

## 2020-12-01 ENCOUNTER — Telehealth: Payer: Self-pay | Admitting: Internal Medicine

## 2020-12-01 MED ORDER — BENZONATATE 200 MG PO CAPS
200.0000 mg | ORAL_CAPSULE | Freq: Three times a day (TID) | ORAL | 0 refills | Status: DC | PRN
Start: 2020-12-01 — End: 2021-05-27

## 2020-12-01 NOTE — Telephone Encounter (Signed)
Patient calling in  Severe cold symptoms..3rd day of fever 100.7 12/01/20..severe cough/congestion..sore throat.. 3 neg covid test  Offered OV & VV w/ available provider.. patient declined  Wants to know if Quay Burow can send over cough suppressant to Pharmacy   [facility] [place], [address] AT [address]  Phone:  [phone] Fax:  [phone]

## 2020-12-01 NOTE — Telephone Encounter (Signed)
Tessalon perles sent - does she want cough syrup with hydrocodone?

## 2020-12-01 NOTE — Telephone Encounter (Signed)
Noted  

## 2021-01-09 ENCOUNTER — Telehealth: Payer: Self-pay | Admitting: Internal Medicine

## 2021-01-09 NOTE — Telephone Encounter (Signed)
Patient declined the Medicare Wellness Visit with [facility]   Does not feel she needs to complete AWV any longer

## 2021-03-06 ENCOUNTER — Encounter: Payer: Self-pay | Admitting: Internal Medicine

## 2021-03-17 DIAGNOSIS — Z1211 Encounter for screening for malignant neoplasm of colon: Secondary | ICD-10-CM | POA: Diagnosis not present

## 2021-03-24 LAB — COLOGUARD: COLOGUARD: NEGATIVE

## 2021-04-08 DIAGNOSIS — L821 Other seborrheic keratosis: Secondary | ICD-10-CM | POA: Diagnosis not present

## 2021-04-08 DIAGNOSIS — D1801 Hemangioma of skin and subcutaneous tissue: Secondary | ICD-10-CM | POA: Diagnosis not present

## 2021-04-08 DIAGNOSIS — L820 Inflamed seborrheic keratosis: Secondary | ICD-10-CM | POA: Diagnosis not present

## 2021-04-08 DIAGNOSIS — D225 Melanocytic nevi of trunk: Secondary | ICD-10-CM | POA: Diagnosis not present

## 2021-05-26 ENCOUNTER — Encounter: Payer: Self-pay | Admitting: Internal Medicine

## 2021-05-26 NOTE — Patient Instructions (Addendum)
Blood work was ordered.     Medications changes include :  try increasing the metoprolol xl to 100 mg twice a day    A Ct scan of your heart arteries was ordered. [place] [address] [phone]  A carotid artery ultrasound was ordered.        Return in about 1 year (around 05/28/2022) for Physical Exam.   Health Maintenance, Female Adopting a healthy lifestyle and getting preventive care are important in promoting health and wellness. Ask your health care provider about: The right schedule for you to have regular tests and exams. Things you can do on your own to prevent diseases and keep yourself healthy. What should I know about diet, weight, and exercise? Eat a healthy diet  Eat a diet that includes plenty of vegetables, fruits, low-fat dairy products, and lean protein. Do not eat a lot of foods that are high in solid fats, added sugars, or sodium. Maintain a healthy weight Body mass index (BMI) is used to identify weight problems. It estimates body fat based on height and weight. Your health care provider can help determine your BMI and help you achieve or maintain a healthy weight. Get regular exercise Get regular exercise. This is one of the most important things you can do for your health. Most adults should: Exercise for at least 150 minutes each week. The exercise should increase your heart rate and make you sweat (moderate-intensity exercise). Do strengthening exercises at least twice a week. This is in addition to the moderate-intensity exercise. Spend less time sitting. Even light physical activity can be beneficial. Watch cholesterol and blood lipids Have your blood tested for lipids and cholesterol at 74 years of age, then have this test every 5 years. Have your cholesterol levels checked more often if: Your lipid or cholesterol levels are high. You are older than 74 years of age. You are at high risk for heart disease. What should I know about cancer  screening? Depending on your health history and family history, you may need to have cancer screening at various ages. This may include screening for: Breast cancer. Cervical cancer. Colorectal cancer. Skin cancer. Lung cancer. What should I know about heart disease, diabetes, and high blood pressure? Blood pressure and heart disease High blood pressure causes heart disease and increases the risk of stroke. This is more likely to develop in people who have high blood pressure readings or are overweight. Have your blood pressure checked: Every 3-5 years if you are 22-40 years of age. Every year if you are 80 years old or older. Diabetes Have regular diabetes screenings. This checks your fasting blood sugar level. Have the screening done: Once every three years after age 28 if you are at a normal weight and have a low risk for diabetes. More often and at a younger age if you are overweight or have a high risk for diabetes. What should I know about preventing infection? Hepatitis B If you have a higher risk for hepatitis B, you should be screened for this virus. Talk with your health care provider to find out if you are at risk for hepatitis B infection. Hepatitis C Testing is recommended for: Everyone born from [birth year] through 1963. Anyone with known risk factors for hepatitis C. Sexually transmitted infections (STIs) Get screened for STIs, including gonorrhea and chlamydia, if: You are sexually active and are younger than 74 years of age. You are older than 74 years of age and your health care provider tells  you that you are at risk for this type of infection. Your sexual activity has changed since you were last screened, and you are at increased risk for chlamydia or gonorrhea. Ask your health care provider if you are at risk. Ask your health care provider about whether you are at high risk for HIV. Your health care provider may recommend a prescription medicine to help prevent HIV  infection. If you choose to take medicine to prevent HIV, you should first get tested for HIV. You should then be tested every 3 months for as long as you are taking the medicine. Pregnancy If you are about to stop having your period (premenopausal) and you may become pregnant, seek counseling before you get pregnant. Take 400 to 800 micrograms (mcg) of folic acid every day if you become pregnant. Ask for birth control (contraception) if you want to prevent pregnancy. Osteoporosis and menopause Osteoporosis is a disease in which the bones lose minerals and strength with aging. This can result in bone fractures. If you are 41 years old or older, or if you are at risk for osteoporosis and fractures, ask your health care provider if you should: Be screened for bone loss. Take a calcium or vitamin D supplement to lower your risk of fractures. Be given hormone replacement therapy (HRT) to treat symptoms of menopause. Follow these instructions at home: Alcohol use Do not drink alcohol if: Your health care provider tells you not to drink. You are pregnant, may be pregnant, or are planning to become pregnant. If you drink alcohol: Limit how much you have to: 0-1 drink a day. Know how much alcohol is in your drink. In the [place] one drink equals one 12 oz bottle of beer (355 mL), one 5 oz glass of wine (148 mL), or one 1 oz glass of hard liquor (44 mL). Lifestyle Do not use any products that contain nicotine or tobacco. These products include cigarettes, chewing tobacco, and vaping devices, such as e-cigarettes. If you need help quitting, ask your health care provider. Do not use street drugs. Do not share needles. Ask your health care provider for help if you need support or information about quitting drugs. General instructions Schedule regular health, dental, and eye exams. Stay current with your vaccines. Tell your health care provider if: You often feel depressed. You have ever been abused  or do not feel safe at home. Summary Adopting a healthy lifestyle and getting preventive care are important in promoting health and wellness. Follow your health care provider's instructions about healthy diet, exercising, and getting tested or screened for diseases. Follow your health care provider's instructions on monitoring your cholesterol and blood pressure. This information is not intended to replace advice given to you by your health care provider. Make sure you discuss any questions you have with your health care provider. Document Revised: 05/12/2020 Document Reviewed: 05/12/2020 Elsevier Patient Education  [place].

## 2021-05-26 NOTE — Progress Notes (Unsigned)
Subjective:    Patient ID: Kimberly Stephenson, female    DOB: 03-Apr-1945, 76 y.o.   MRN: [id number]      HPI Kimberly Stephenson is here for No chief complaint on file.      Medications and allergies reviewed with patient and updated if appropriate.  Current Outpatient Medications on File Prior to Visit  Medication Sig Dispense Refill   aspirin-acetaminophen-caffeine (EXCEDRIN MIGRAINE) 250-250-65 MG tablet Take 1 tablet by mouth daily. Reported on 04/08/2015     benzonatate (TESSALON) 200 MG capsule Take 1 capsule (200 mg total) by mouth 3 (three) times daily as needed for cough. 30 capsule 0   Cholecalciferol (VITAMIN D3) 25 MCG (1000 UT) CAPS Take 1 capsule (1,000 Units total) by mouth daily. 60 capsule    Clindamycin-Benzoyl Per, Refr, gel Apply topically 2 (two) times daily.     clonazePAM (KLONOPIN) 0.5 MG tablet Take 0.5-1 tablets (0.25-0.5 mg total) by mouth 2 (two) times daily as needed for anxiety. 20 tablet 0   diphenhydrAMINE HCl (BENADRYL ALLERGY PO) Take by mouth.     ketoconazole (NIZORAL) 2 % cream APPLY TOPICALLY TO THE AFFECTED AREA TWICE DAILY     levocetirizine (XYZAL) 5 MG tablet TAKE 1 TABLET(5 MG) BY MOUTH EVERY EVENING 30 tablet 5   metoprolol succinate (TOPROL-XL) 50 MG 24 hr tablet Take 1.5 tablets (75 mg total) by mouth in the morning and at bedtime. Take with or immediately following a meal. 270 tablet 2   Multiple Vitamins-Calcium (ONE-A-DAY WOMENS PO) Take 1 each by mouth daily.       nitrofurantoin, macrocrystal-monohydrate, (MACROBID) 100 MG capsule Take 1 capsule (100 mg total) by mouth 2 (two) times daily. 14 capsule 0   No current facility-administered medications on file prior to visit.    Review of Systems     Objective:  There were no vitals filed for this visit. There were no vitals filed for this visit. There is no height or weight on file to calculate BMI.  BP Readings from Last 3 Encounters:  09/26/20 (!) 142/82  05/27/20 (!) 142/72  03/20/20  132/78    Wt Readings from Last 3 Encounters:  09/26/20 157 lb (71.2 kg)  05/27/20 158 lb 3.2 oz (71.8 kg)  03/20/20 158 lb 3.2 oz (71.8 kg)       09/27/2020   12:49 PM 07/17/2019    8:40 AM 03/24/2017    9:34 AM 04/08/2015   12:05 PM 03/04/2014    3:29 PM  Depression screen PHQ 2/9  Decreased Interest 1 0 0 0 0  Down, Depressed, Hopeless 1 0 1 0 0  PHQ - 2 Score 2 0 1 0 0  Altered sleeping 3      Tired, decreased energy 3      Change in appetite 3      Feeling bad or failure about yourself  1      Trouble concentrating 2      Moving slowly or fidgety/restless 2      Suicidal thoughts 0      PHQ-9 Score 16            09/27/2020   12:49 PM  GAD 7 : Generalized Anxiety Score  Nervous, Anxious, on Edge 0  Control/stop worrying 1  Worry too much - different things 1  Trouble relaxing 1  Restless 0  Easily annoyed or irritable 0  Afraid - awful might happen 0  Total GAD 7 Score 3  Anxiety Difficulty  Somewhat difficult        Physical Exam Constitutional: She appears well-developed and well-nourished. No distress.  HENT:  Head: Normocephalic and atraumatic.  Right Ear: External ear normal. Normal ear canal and TM Left Ear: External ear normal.  Normal ear canal and TM Mouth/Throat: Oropharynx is clear and moist.  Eyes: Conjunctivae normal.  Neck: Neck supple. No tracheal deviation present. No thyromegaly present.  No carotid bruit  Cardiovascular: Normal rate, regular rhythm and normal heart sounds.   No murmur heard.  No edema. Pulmonary/Chest: Effort normal and breath sounds normal. No respiratory distress. She has no wheezes. She has no rales.  Breast: deferred   Abdominal: Soft. She exhibits no distension. There is no tenderness.  Lymphadenopathy: She has no cervical adenopathy.  Skin: Skin is warm and dry. She is not diaphoretic.  Psychiatric: She has a normal mood and affect. Her behavior is normal.     Lab Results  Component Value Date   WBC 5.6  07/17/2019   HGB 13.9 07/17/2019   HCT 40.5 07/17/2019   PLT 229 07/17/2019   GLUCOSE 89 07/17/2019   CHOL 225 (H) 07/17/2019   TRIG 103 07/17/2019   HDL 69 07/17/2019   LDLDIRECT 142.7 08/21/2012   LDLCALC 135 (H) 07/17/2019   ALT 17 07/17/2019   AST 21 07/17/2019   NA 140 07/17/2019   K 4.1 07/17/2019   CL 105 07/17/2019   CREATININE 0.83 07/17/2019   BUN 12 07/17/2019   CO2 27 07/17/2019   TSH 4.00 07/17/2019   HGBA1C 5.1 07/17/2019         Assessment & Plan:   Physical exam: Screening blood work  ordered Exercise   Weight   Substance abuse  none   Reviewed recommended immunizations.   Health Maintenance  Topic Date Due   Zoster Vaccines- Shingrix (1 of 2) Never done   Pneumonia Vaccine 68+ Years old (1 - PCV) Never done   DEXA SCAN  Never done   MAMMOGRAM  07/02/2017   COVID-19 Vaccine (3 - Booster for Pfizer series) 05/03/2019   INFLUENZA VACCINE  08/04/2021   TETANUS/TDAP  08/23/2021   Fecal DNA (Cologuard)  03/17/2024   Hepatitis C Screening  Completed   HPV VACCINES  Aged Out   COLONOSCOPY (Pts 45-92yr Insurance coverage will need to be confirmed)  Discontinued          See Problem List for Assessment and Plan of chronic medical problems.

## 2021-05-27 ENCOUNTER — Ambulatory Visit (INDEPENDENT_AMBULATORY_CARE_PROVIDER_SITE_OTHER): Payer: Medicare PPO | Admitting: Internal Medicine

## 2021-05-27 VITALS — BP 140/80 | HR 63 | Temp 98.5°F | Ht 62.0 in | Wt 163.4 lb

## 2021-05-27 DIAGNOSIS — Z0000 Encounter for general adult medical examination without abnormal findings: Secondary | ICD-10-CM | POA: Diagnosis not present

## 2021-05-27 DIAGNOSIS — E559 Vitamin D deficiency, unspecified: Secondary | ICD-10-CM | POA: Diagnosis not present

## 2021-05-27 DIAGNOSIS — Z136 Encounter for screening for cardiovascular disorders: Secondary | ICD-10-CM | POA: Diagnosis not present

## 2021-05-27 DIAGNOSIS — I10 Essential (primary) hypertension: Secondary | ICD-10-CM | POA: Diagnosis not present

## 2021-05-27 DIAGNOSIS — H9313 Tinnitus, bilateral: Secondary | ICD-10-CM

## 2021-05-27 DIAGNOSIS — E7849 Other hyperlipidemia: Secondary | ICD-10-CM | POA: Diagnosis not present

## 2021-05-27 DIAGNOSIS — E042 Nontoxic multinodular goiter: Secondary | ICD-10-CM | POA: Diagnosis not present

## 2021-05-27 DIAGNOSIS — R739 Hyperglycemia, unspecified: Secondary | ICD-10-CM | POA: Diagnosis not present

## 2021-05-27 DIAGNOSIS — Z113 Encounter for screening for infections with a predominantly sexual mode of transmission: Secondary | ICD-10-CM

## 2021-05-27 DIAGNOSIS — I779 Disorder of arteries and arterioles, unspecified: Secondary | ICD-10-CM | POA: Insufficient documentation

## 2021-05-27 DIAGNOSIS — I6523 Occlusion and stenosis of bilateral carotid arteries: Secondary | ICD-10-CM | POA: Diagnosis not present

## 2021-05-27 MED ORDER — METOPROLOL SUCCINATE ER 50 MG PO TB24: 100.0000 mg | ORAL_TABLET | Freq: Two times a day (BID) | ORAL | 2 refills | Status: DC

## 2021-05-27 NOTE — Assessment & Plan Note (Signed)
Chronic Has mild disease Recheck carotid artery ultrasound, especially given tinnitus and hearing her heartbeat in her head

## 2021-05-27 NOTE — Assessment & Plan Note (Addendum)
Chronic Check lipid panel  Lifestyle controlled Regular exercise and healthy diet encouraged Has risk factors and family history of CAD-we will get CT coronary artery calcium score to evaluate further and see if statin is necessary

## 2021-05-27 NOTE — Assessment & Plan Note (Signed)
Chronic Taking vitamin d daily Check vitamin d level  

## 2021-05-27 NOTE — Assessment & Plan Note (Addendum)
Chronic Plans on establishing with endocrine for follow-up tsh

## 2021-05-27 NOTE — Assessment & Plan Note (Signed)
Chronic Check a1c Low sugar / carb diet Stressed regular exercise  

## 2021-05-27 NOTE — Assessment & Plan Note (Signed)
Chronic Has gotten worse-initially intermittent and now more persistent Denies hearing loss Feels like it is in her head and is concerned about possible cause Carotid ultrasound Deferred ENT evaluation

## 2021-05-27 NOTE — Assessment & Plan Note (Addendum)
Chronic BP not ideally controlled Try increasing metoprolol xl 75 mg bid to 100 mg twice daily to see if that helps without lowering her blood pressure or heart rate too much.  Discussed option of adding a second medication, but given her sensitivities medication we will try this first cmp

## 2021-05-29 LAB — LIPID PANEL
Cholesterol: 203 mg/dL — ABNORMAL HIGH (ref 0–200)
HDL: 60.3 mg/dL (ref >39.00)
LDL Cholesterol: 124 mg/dL — ABNORMAL HIGH (ref 0–99)
NonHDL: 142.51
Total CHOL/HDL Ratio: 3
Triglycerides: 91 mg/dL (ref 0.0–149.0)
VLDL: 18.2 mg/dL (ref 0.0–40.0)

## 2021-05-29 LAB — COMPREHENSIVE METABOLIC PANEL
ALT: 21 U/L (ref 0–35)
AST: 22 U/L (ref 0–37)
Albumin: 4.1 g/dL (ref 3.5–5.2)
Alkaline Phosphatase: 63 U/L (ref 39–117)
BUN: 14 mg/dL (ref 6–23)
CO2: 29 mEq/L (ref 19–32)
Calcium: 9.8 mg/dL (ref 8.4–10.5)
Chloride: 105 mEq/L (ref 96–112)
Creatinine, Ser: 0.77 mg/dL (ref 0.40–1.20)
GFR: 76.45 mL/min (ref >60.00)
Glucose, Bld: 92 mg/dL (ref 70–99)
Potassium: 4.5 mEq/L (ref 3.5–5.1)
Sodium: 140 mEq/L (ref 135–145)
Total Bilirubin: 0.7 mg/dL (ref 0.2–1.2)
Total Protein: 6.8 g/dL (ref 6.0–8.3)

## 2021-05-29 LAB — TSH: TSH: 3.03 u[IU]/mL (ref 0.35–5.50)

## 2021-05-29 LAB — CBC WITH DIFFERENTIAL/PLATELET
Basophils Absolute: 0.1 10*3/uL (ref 0.0–0.1)
Basophils Relative: 1.3 % (ref 0.0–3.0)
Eosinophils Absolute: 0.2 10*3/uL (ref 0.0–0.7)
Eosinophils Relative: 4.2 % (ref 0.0–5.0)
HCT: 39.9 % (ref 36.0–46.0)
Hemoglobin: 13.6 g/dL (ref 12.0–15.0)
Lymphocytes Relative: 37.9 % (ref 12.0–46.0)
Lymphs Abs: 1.8 10*3/uL (ref 0.7–4.0)
MCHC: 34.1 g/dL (ref 30.0–36.0)
MCV: 90.3 fl (ref 78.0–100.0)
Monocytes Absolute: 0.5 10*3/uL (ref 0.1–1.0)
Monocytes Relative: 11.1 % (ref 3.0–12.0)
Neutro Abs: 2.2 10*3/uL (ref 1.4–7.7)
Neutrophils Relative %: 45.5 % (ref 43.0–77.0)
Platelets: 222 10*3/uL (ref 150.0–400.0)
RBC: 4.41 Mil/uL (ref 3.87–5.11)
RDW: 12.9 % (ref 11.5–15.5)
WBC: 4.8 10*3/uL (ref 4.0–10.5)

## 2021-05-29 LAB — VITAMIN D 25 HYDROXY (VIT D DEFICIENCY, FRACTURES): VITD: 40.06 ng/mL (ref 30.00–100.00)

## 2021-05-29 LAB — HEMOGLOBIN A1C: Hgb A1c MFr Bld: 5.3 % (ref 4.6–6.5)

## 2021-05-30 LAB — RPR: RPR Ser Ql: NONREACTIVE

## 2021-06-18 ENCOUNTER — Encounter: Payer: Self-pay | Admitting: Internal Medicine

## 2021-06-18 ENCOUNTER — Ambulatory Visit (HOSPITAL_COMMUNITY)
Admission: RE | Admit: 2021-06-18 | Discharge: 2021-06-18 | Disposition: A | Discharge status: Home / Self Care | Payer: Medicare PPO | Source: Ambulatory Visit | Attending: Internal Medicine | Admitting: Internal Medicine

## 2021-06-18 DIAGNOSIS — I6523 Occlusion and stenosis of bilateral carotid arteries: Secondary | ICD-10-CM | POA: Diagnosis not present

## 2021-06-18 NOTE — Progress Notes (Signed)
Subjective:    Patient ID: Kimberly Stephenson, female    DOB: 1945/08/25, 76 y.o.   MRN: [id number]      HPI Arianis is here for  Chief Complaint  Patient presents with   Ear Pain    Both ears (pain noted from traveling to [place] and back) Left side worse than right    L > R ear pain - related to flying.  It is a little better.  She has tried popping her ears.  The left ear was painful > right ear three days ago.   It was getting worse.  She has tried benadryl, hot water bottle, advil.  It is beginning to clear.  If she blows her nose it feels like liquid coming out of the left ear and it clears a little.  If she leans forward she gets a fluid sensation in the ears and they get clogged.  Hearing is decreased - it is getting better.     Uses flonase intermittently.      Medications and allergies reviewed with patient and updated if appropriate.  Current Outpatient Medications on File Prior to Visit  Medication Sig Dispense Refill   aspirin-acetaminophen-caffeine (EXCEDRIN MIGRAINE) 250-250-65 MG tablet Take 1 tablet by mouth daily. Reported on 04/08/2015     Cholecalciferol (VITAMIN D3) 25 MCG (1000 UT) CAPS Take 1 capsule (1,000 Units total) by mouth daily. 60 capsule    Clindamycin-Benzoyl Per, Refr, gel Apply topically 2 (two) times daily.     diphenhydrAMINE HCl (BENADRYL ALLERGY PO) Take by mouth.     ketoconazole (NIZORAL) 2 % cream APPLY TOPICALLY TO THE AFFECTED AREA TWICE DAILY     levocetirizine (XYZAL) 5 MG tablet TAKE 1 TABLET(5 MG) BY MOUTH EVERY EVENING 30 tablet 5   metoprolol succinate (TOPROL-XL) 50 MG 24 hr tablet Take 2 tablets (100 mg total) by mouth in the morning and at bedtime. Take with or immediately following a meal.  2   Multiple Vitamins-Calcium (ONE-A-DAY WOMENS PO) Take 1 each by mouth daily.       ZYLET 0.5-0.3 % SUSP Apply 1 drop to eye 4 (four) times daily.     No current facility-administered medications on file prior to visit.    Review of  Systems  Constitutional:  Negative for fever.  HENT:  Negative for congestion, ear discharge, sinus pressure and sore throat.        Objective:   Vitals:   06/19/21 1339  BP: 140/78  Pulse: 71  Temp: 97.6 F (36.4 C)  SpO2: 98%   BP Readings from Last 3 Encounters:  06/19/21 140/78  05/27/21 140/80  09/26/20 (!) 142/82   Wt Readings from Last 3 Encounters:  06/19/21 163 lb 12.8 oz (74.3 kg)  05/27/21 163 lb 6.4 oz (74.1 kg)  09/26/20 157 lb (71.2 kg)   Body mass index is 29.96 kg/m.    Physical Exam Constitutional:      General: She is not in acute distress.    Appearance: Normal appearance.  HENT:     Head: Normocephalic and atraumatic.     Right Ear: Tympanic membrane, ear canal and external ear normal. There is no impacted cerumen.     Left Ear: Ear canal and external ear normal. There is no impacted cerumen.     Ears:     Comments: TM on the left slightly bulging-?  Chronic Eyes:     Conjunctiva/sclera: Conjunctivae normal.  Musculoskeletal:     Cervical back: Neck  supple. No tenderness.  Lymphadenopathy:     Cervical: No cervical adenopathy.  Skin:    General: Skin is warm and dry.  Neurological:     Mental Status: She is alert.            Assessment & Plan:    See Problem List for Assessment and Plan of chronic medical problems.

## 2021-06-19 ENCOUNTER — Ambulatory Visit: Payer: Medicare PPO | Admitting: Internal Medicine

## 2021-06-19 DIAGNOSIS — I10 Essential (primary) hypertension: Secondary | ICD-10-CM | POA: Diagnosis not present

## 2021-06-19 DIAGNOSIS — H6993 Unspecified Eustachian tube disorder, bilateral: Secondary | ICD-10-CM | POA: Insufficient documentation

## 2021-06-19 DIAGNOSIS — H6983 Other specified disorders of Eustachian tube, bilateral: Secondary | ICD-10-CM

## 2021-06-19 NOTE — Assessment & Plan Note (Addendum)
Acute b/l ears Left ear > right ear She has seen improvement in the past 3 days which is positive Discussed etiology and things that typically help Advised using Flonase twice daily, but use saline spray on top of that to prevent over drying the nasal passageways She does not tolerate Sudafed Continue OTC antihistamine Deferred prednisone She will continually try to pop her ears gently Since she has been seeing improvement in the past 3 days hopefully she will continue to see improvement Can see ENT if needed Discussed premedicating 24-48 hours before future flights with Flonase, antihistamines

## 2021-06-19 NOTE — Patient Instructions (Addendum)
[facility] [phone] - for the Ct scan of your heart   Dr Benjamine Mola - ENT in [place].       Continue the flonase and trying to pop your ears.  Continue your allergy medication   Eustachian Tube Dysfunction  Eustachian tube dysfunction refers to a condition in which a blockage develops in the narrow passage that connects the middle ear to the back of the nose (eustachian tube). The eustachian tube regulates air pressure in the middle ear by letting air move between the ear and nose. It also helps to drain fluid from the middle ear space. Eustachian tube dysfunction can affect one or both ears. When the eustachian tube does not function properly, air pressure, fluid, or both can build up in the middle ear. What are the causes? This condition occurs when the eustachian tube becomes blocked or cannot open normally. Common causes of this condition include: Ear infections. Colds and other infections that affect the nose, mouth, and throat (upper respiratory tract). Allergies. Irritation from cigarette smoke. Irritation from stomach acid coming up into the esophagus (gastroesophageal reflux). The esophagus is the part of the body that moves food from the mouth to the stomach. Sudden changes in air pressure, such as from descending in an airplane or scuba diving. Abnormal growths in the nose or throat, such as: Growths that line the nose (nasal polyps). Abnormal growth of cells (tumors). Enlarged tissue at the back of the throat (adenoids). What increases the risk? You are more likely to develop this condition if: You smoke. You are overweight. You are a child who has: Certain birth defects of the mouth, such as cleft palate. Large tonsils or adenoids. What are the signs or symptoms? Common symptoms of this condition include: A feeling of fullness in the ear. Ear pain. Clicking or popping noises in the ear. Ringing in the ear (tinnitus). Hearing loss. Loss of  balance. Dizziness. Symptoms may get worse when the air pressure around you changes, such as when you travel to an area of high elevation, fly on an airplane, or go scuba diving. How is this diagnosed? This condition may be diagnosed based on: Your symptoms. A physical exam of your ears, nose, and throat. Tests, such as those that measure: The movement of your eardrum. Your hearing (audiometry). How is this treated? Treatment depends on the cause and severity of your condition. In mild cases, you may relieve your symptoms by moving air into your ears. This is called "popping the ears." In more severe cases, or if you have symptoms of fluid in your ears, treatment may include: Medicines to relieve congestion (decongestants). Medicines that treat allergies (antihistamines). Nasal sprays or ear drops that contain medicines that reduce swelling (steroids). A procedure to drain the fluid in your eardrum. In this procedure, a small tube may be placed in the eardrum to: Drain the fluid. Restore the air in the middle ear space. A procedure to insert a balloon device through the nose to inflate the opening of the eustachian tube (balloon dilation). Follow these instructions at home: Lifestyle Do not do any of the following until your health care provider approves: Travel to high altitudes. Fly in airplanes. Work in a Pension scheme manager or room. Scuba dive. Do not use any products that contain nicotine or tobacco. These products include cigarettes, chewing tobacco, and vaping devices, such as e-cigarettes. If you need help quitting, ask your health care provider. Keep your ears dry. Wear fitted earplugs during showering and bathing. Dry your  ears completely after. General instructions Take over-the-counter and prescription medicines only as told by your health care provider. Use techniques to help pop your ears as recommended by your health care provider. These may include: Chewing  gum. Yawning. Frequent, forceful swallowing. Closing your mouth, holding your nose closed, and gently blowing as if you are trying to blow air out of your nose. Keep all follow-up visits. This is important. Contact a health care provider if: Your symptoms do not go away after treatment. Your symptoms come back after treatment. You are unable to pop your ears. You have: A fever. Pain in your ear. Pain in your head or neck. Fluid draining from your ear. Your hearing suddenly changes. You become very dizzy. You lose your balance. Get help right away if: You have a sudden, severe increase in any of your symptoms. Summary Eustachian tube dysfunction refers to a condition in which a blockage develops in the eustachian tube. It can be caused by ear infections, allergies, inhaled irritants, or abnormal growths in the nose or throat. Symptoms may include ear pain or fullness, hearing loss, or ringing in the ears. Mild cases are treated with techniques to unblock the ears, such as yawning or chewing gum. More severe cases are treated with medicines or procedures. This information is not intended to replace advice given to you by your health care provider. Make sure you discuss any questions you have with your health care provider. Document Revised: 03/03/2020 Document Reviewed: 03/03/2020 Elsevier Patient Education  [place].

## 2021-06-19 NOTE — Assessment & Plan Note (Signed)
Chronic Blood pressure borderline, but very stable for her I am okay with where her blood pressure is so I do not feel we need to make any changes to her medications Continue metoprolol XL 100 mg daily

## 2021-06-23 ENCOUNTER — Ambulatory Visit (INDEPENDENT_AMBULATORY_CARE_PROVIDER_SITE_OTHER)
Admission: RE | Admit: 2021-06-23 | Discharge: 2021-06-23 | Disposition: A | Discharge status: Home / Self Care | Payer: Self-pay | Source: Ambulatory Visit | Attending: Internal Medicine | Admitting: Internal Medicine

## 2021-06-23 DIAGNOSIS — Z136 Encounter for screening for cardiovascular disorders: Secondary | ICD-10-CM

## 2021-06-24 ENCOUNTER — Ambulatory Visit: Payer: Medicare PPO | Admitting: Internal Medicine

## 2021-06-24 DIAGNOSIS — H1045 Other chronic allergic conjunctivitis: Secondary | ICD-10-CM | POA: Diagnosis not present

## 2021-06-24 DIAGNOSIS — H47323 Drusen of optic disc, bilateral: Secondary | ICD-10-CM | POA: Diagnosis not present

## 2021-06-24 DIAGNOSIS — H2513 Age-related nuclear cataract, bilateral: Secondary | ICD-10-CM | POA: Diagnosis not present

## 2021-06-24 DIAGNOSIS — H04123 Dry eye syndrome of bilateral lacrimal glands: Secondary | ICD-10-CM | POA: Diagnosis not present

## 2021-06-24 DIAGNOSIS — H34231 Retinal artery branch occlusion, right eye: Secondary | ICD-10-CM | POA: Diagnosis not present

## 2021-07-15 ENCOUNTER — Other Ambulatory Visit: Payer: Self-pay | Admitting: Internal Medicine

## 2021-10-05 ENCOUNTER — Encounter: Payer: Self-pay | Admitting: Internal Medicine

## 2021-10-07 ENCOUNTER — Other Ambulatory Visit: Payer: Self-pay

## 2021-10-07 MED ORDER — METOPROLOL SUCCINATE ER 100 MG PO TB24: 100.0000 mg | ORAL_TABLET | Freq: Two times a day (BID) | ORAL | 3 refills | Status: DC

## 2021-10-07 NOTE — Telephone Encounter (Signed)
MEDICATION: metoprolol succinate (TOPROL-XL) 50 MG 24 hr tablet  PHARMACY: [id number] - [place], [address]  Comments: See message below about dosage change   **Let patient know to contact pharmacy at the end of the day to make sure medication is ready. **  ** Please notify patient to allow 48-72 hours to process**  **Encourage patient to contact the pharmacy for refills or they can request refills through [facility]**

## 2022-01-21 ENCOUNTER — Other Ambulatory Visit: Payer: Self-pay | Admitting: Internal Medicine

## 2022-02-23 ENCOUNTER — Encounter: Payer: Self-pay | Admitting: Internal Medicine

## 2022-02-24 ENCOUNTER — Encounter: Payer: Self-pay | Admitting: Internal Medicine

## 2022-02-24 NOTE — Progress Notes (Unsigned)
    Subjective:    Patient ID: Kimberly Stephenson, female    DOB: 09/23/1945, 77 y.o.   MRN: [id number]      HPI Kimberly Stephenson is here for No chief complaint on file.    Blood pressure issues -   BP spiking up in December after she had URI - BP 182/92 - she upped her metoprolol to 300 mg /day - BP has been controlled.      Medications and allergies reviewed with patient and updated if appropriate.  Current Outpatient Medications on File Prior to Visit  Medication Sig Dispense Refill   aspirin-acetaminophen-caffeine (EXCEDRIN MIGRAINE) 250-250-65 MG tablet Take 1 tablet by mouth daily. Reported on 04/08/2015     Cholecalciferol (VITAMIN D3) 25 MCG (1000 UT) CAPS Take 1 capsule (1,000 Units total) by mouth daily. 60 capsule    Clindamycin-Benzoyl Per, Refr, gel Apply topically 2 (two) times daily.     diphenhydrAMINE HCl (BENADRYL ALLERGY PO) Take by mouth.     ketoconazole (NIZORAL) 2 % cream APPLY TOPICALLY TO THE AFFECTED AREA TWICE DAILY     levocetirizine (XYZAL) 5 MG tablet TAKE 1 TABLET(5 MG) BY MOUTH EVERY EVENING 30 tablet 5   metoprolol succinate (TOPROL-XL) 100 MG 24 hr tablet Take 1 tablet (100 mg total) by mouth 2 (two) times daily. Take with or immediately following a meal. 180 tablet 3   Multiple Vitamins-Calcium (ONE-A-DAY WOMENS PO) Take 1 each by mouth daily.       ZYLET 0.5-0.3 % SUSP Apply 1 drop to eye 4 (four) times daily.     No current facility-administered medications on file prior to visit.    Review of Systems     Objective:  There were no vitals filed for this visit. BP Readings from Last 3 Encounters:  06/19/21 140/78  05/27/21 140/80  09/26/20 (!) 142/82   Wt Readings from Last 3 Encounters:  06/19/21 163 lb 12.8 oz (74.3 kg)  05/27/21 163 lb 6.4 oz (74.1 kg)  09/26/20 157 lb (71.2 kg)   There is no height or weight on file to calculate BMI.    Physical Exam         Assessment & Plan:    See Problem List for Assessment and Plan of chronic  medical problems.

## 2022-02-25 ENCOUNTER — Ambulatory Visit: Payer: Medicare PPO | Admitting: Internal Medicine

## 2022-02-25 ENCOUNTER — Encounter: Payer: Self-pay | Admitting: Internal Medicine

## 2022-02-25 VITALS — BP 162/90 | HR 60 | Temp 98.4°F | Ht 62.0 in | Wt 166.0 lb

## 2022-02-25 DIAGNOSIS — L821 Other seborrheic keratosis: Secondary | ICD-10-CM | POA: Diagnosis not present

## 2022-02-25 DIAGNOSIS — L820 Inflamed seborrheic keratosis: Secondary | ICD-10-CM | POA: Diagnosis not present

## 2022-02-25 DIAGNOSIS — L720 Epidermal cyst: Secondary | ICD-10-CM | POA: Diagnosis not present

## 2022-02-25 DIAGNOSIS — I10 Essential (primary) hypertension: Secondary | ICD-10-CM

## 2022-02-25 MED ORDER — METOPROLOL SUCCINATE ER 100 MG PO TB24: 150.0000 mg | ORAL_TABLET | Freq: Two times a day (BID) | ORAL | 3 refills | Status: DC

## 2022-02-25 MED ORDER — HYDROCHLOROTHIAZIDE 12.5 MG PO TABS: 12.5000 mg | ORAL_TABLET | Freq: Every day | ORAL | 3 refills | Status: DC

## 2022-02-25 NOTE — Assessment & Plan Note (Signed)
Chronic Not ideally controlled even with metoprolol increase Continue metoprolol xl 150 mg bid Start hctz 12.5 mg daily Stressed low sodium diet Start regular exercise Work on weight loss BMP in 1-2 weeks

## 2022-02-25 NOTE — Patient Instructions (Addendum)
      Blood work was ordered.   Have this done in 1-2 weeks    Medications changes include :   continue metoprolol xl 300 mg per day and start hydrochlorothiazide 12.5 mg daily    Return for Physical Exam in June.

## 2022-03-11 ENCOUNTER — Other Ambulatory Visit (INDEPENDENT_AMBULATORY_CARE_PROVIDER_SITE_OTHER): Payer: Medicare PPO

## 2022-03-11 DIAGNOSIS — I10 Essential (primary) hypertension: Secondary | ICD-10-CM | POA: Diagnosis not present

## 2022-03-11 LAB — BASIC METABOLIC PANEL
BUN: 14 mg/dL (ref 6–23)
CO2: 28 mEq/L (ref 19–32)
Calcium: 9.3 mg/dL (ref 8.4–10.5)
Chloride: 102 mEq/L (ref 96–112)
Creatinine, Ser: 0.79 mg/dL (ref 0.40–1.20)
GFR: 73.73 mL/min (ref >60.00)
Glucose, Bld: 104 mg/dL — ABNORMAL HIGH (ref 70–99)
Potassium: 3.8 mEq/L (ref 3.5–5.1)
Sodium: 139 mEq/L (ref 135–145)

## 2022-03-27 IMAGING — MR MR HIP*R* W/O CM
4 of 5 series · 29 of 40 positions shown · non-contrast
Comparison: Plain films right hip 07/12/2019 and 03/23/2016.

CLINICAL DATA: Right hip pain for 5 months.  No known injury.

EXAM:
MR OF THE RIGHT HIP WITHOUT CONTRAST
TECHNIQUE: Multiplanar, multisequence MR imaging was performed. No intravenous
contrast was administered.

[Series 4: T1 · coronal · 4.0mm · 0.74mm/px · 7 of 18 slices shown]
[im 1/18]
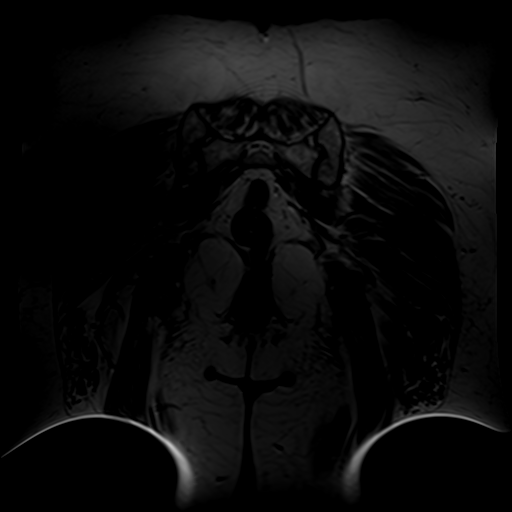
[im 3/18]
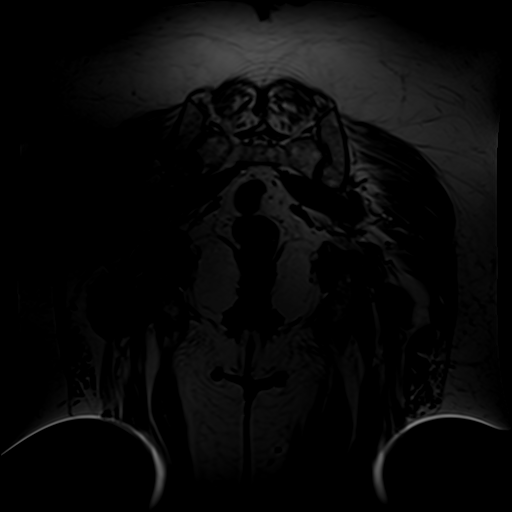
[im 6/18]
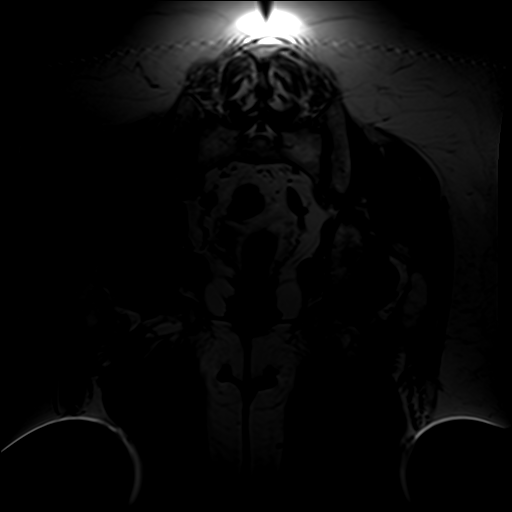
[im 9/18]
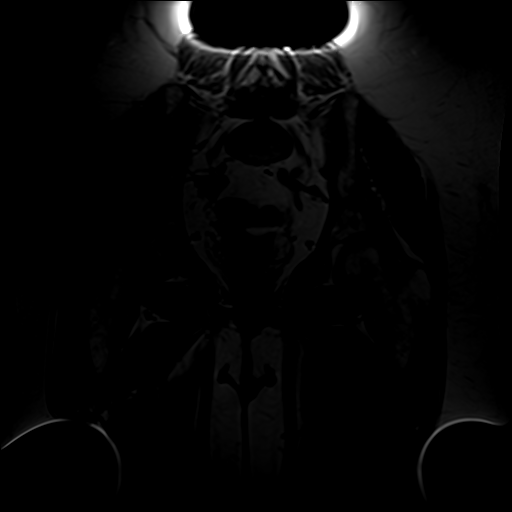
[im 12/18]
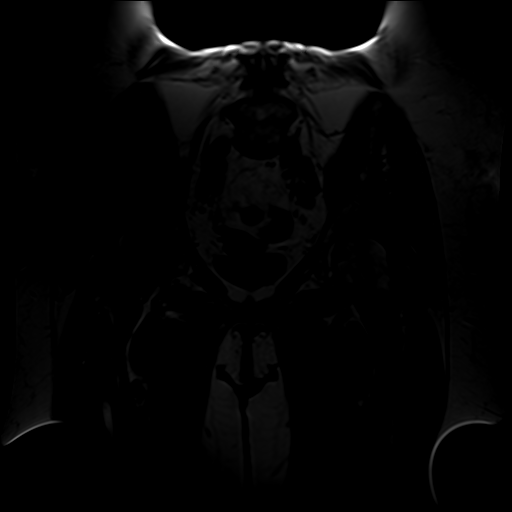
[im 15/18]
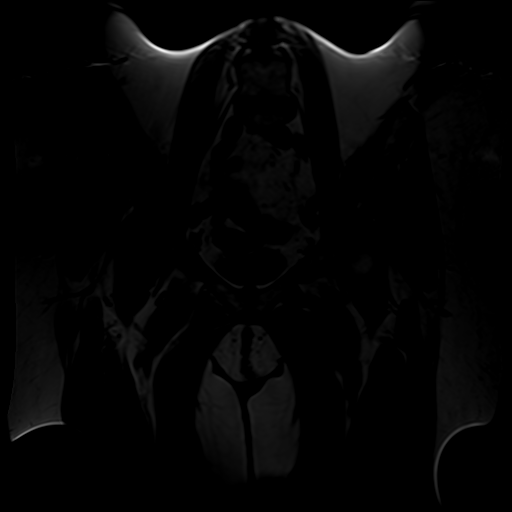
[im 18/18]
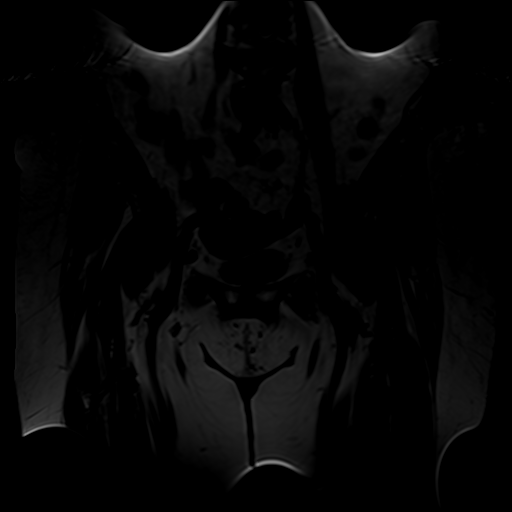

[Series 5: T2 fat-sat · coronal · 4.0mm · 0.74mm/px · 9 of 24 slices shown]
[im 1/24]
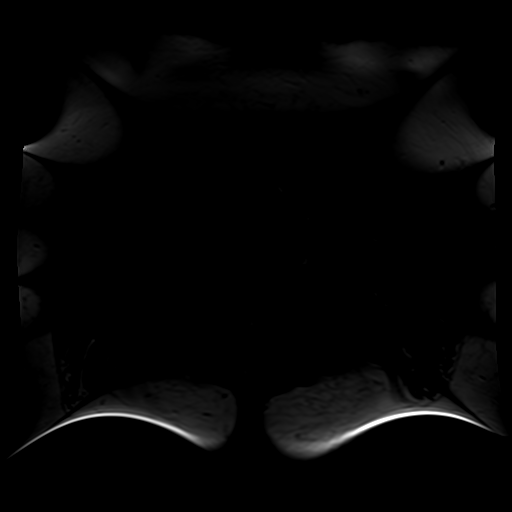
[im 3/24]
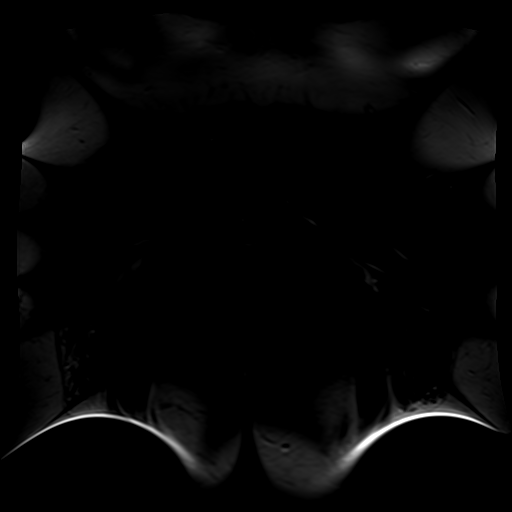
[im 6/24]
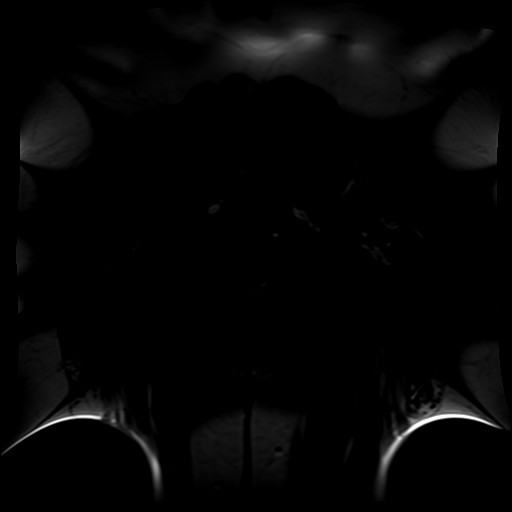
[im 9/24]
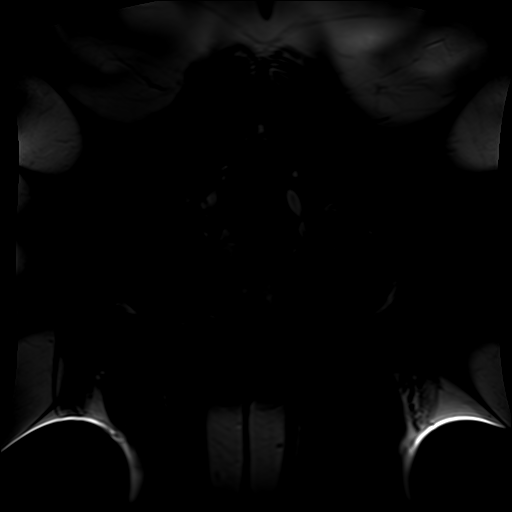
[im 12/24]
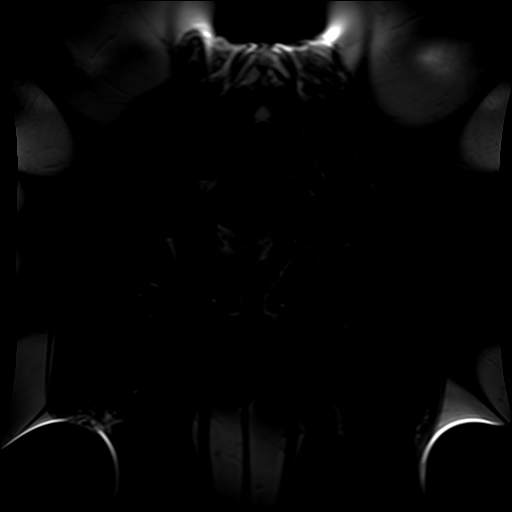
[im 15/24]
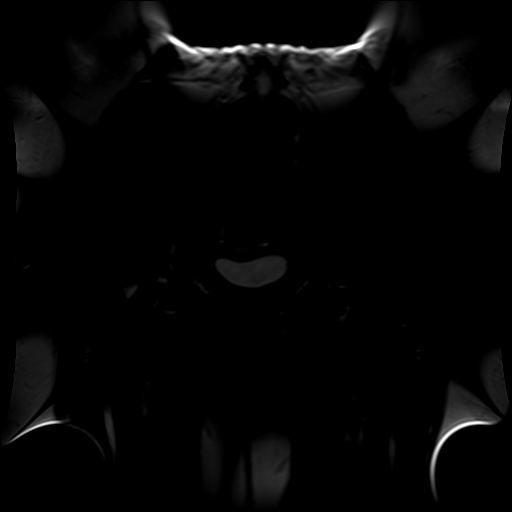
[im 18/24]
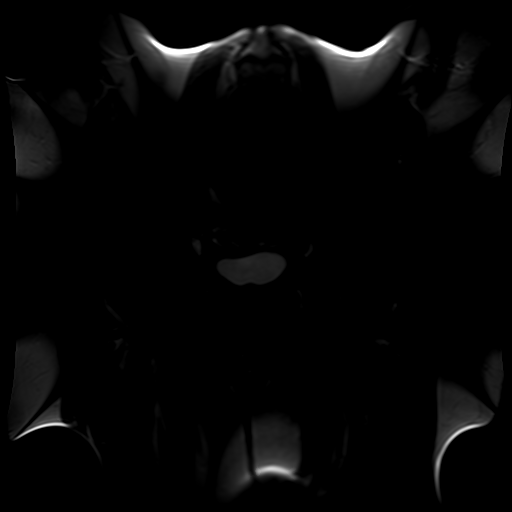
[im 21/24]
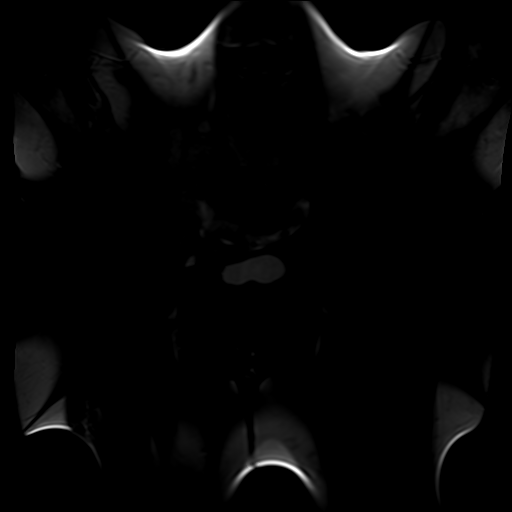
[im 24/24]
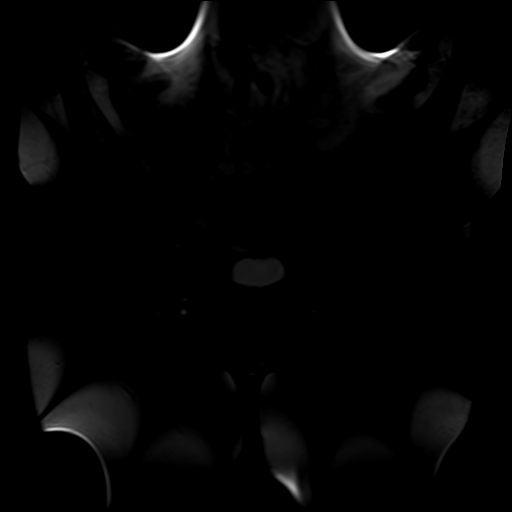

[Series 6: PD fat-sat · sagittal · 4.0mm · 0.70mm/px · 8 of 22 slices shown (1 of 2)]
[im 1/22]
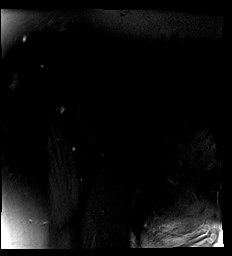
[im 4/22]
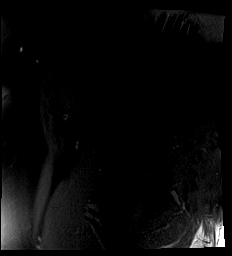
[im 7/22]
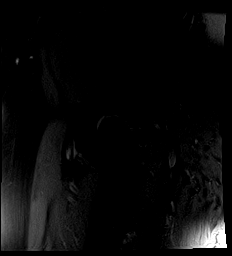
[im 10/22]
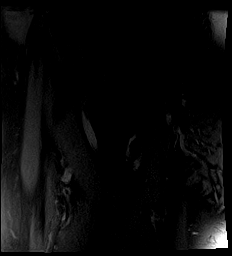
[im 13/22]
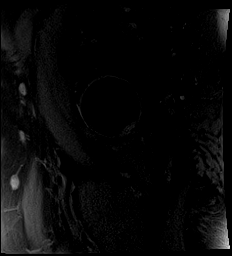
[im 16/22]
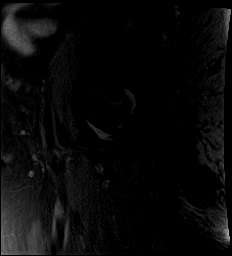
[im 19/22]
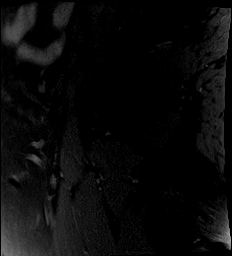
[im 22/22]
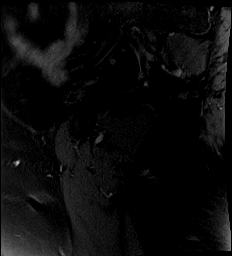

[Series 7: PD fat-sat · coronal · 4.0mm · 0.70mm/px · 5 of 19 slices shown (2 of 2)]
[im 1/19]
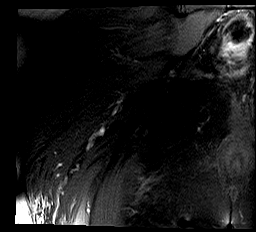
[im 4/19]
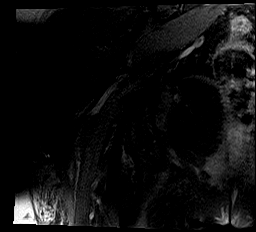
[im 7/19]
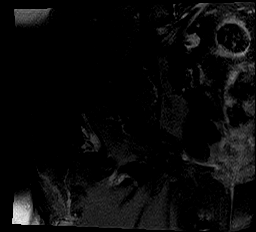
[im 10/19]
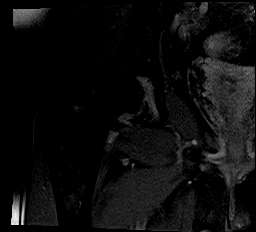
[im 16/19]
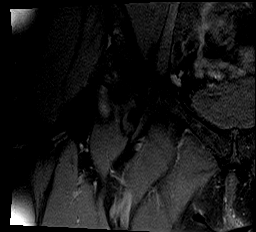

[29 of 40 positions shown; findings below may reference images not displayed]

FINDINGS: Bones: No fracture or worrisome lesion. Mild-to-moderate subchondral
edema is seen about the right hip. No subchondral edema or cyst
formation about the left hip.

Articular cartilage and labrum

Articular cartilage: Markedly thinned on the right with associated
joint space narrowing.

Labrum: Degenerative tearing of the right anterior and superior
labrum is seen.

Joint or bursal effusion

Joint effusion:  Small right effusion.

Bursae: Negative.

Muscles and tendons

Muscles and tendons:  Intact and normal in appearance.

Other findings

Miscellaneous:   None.
IMPRESSION: No acute finding.

Right hip osteoarthritis with some associated subchondral edema
about the joint and degenerative tearing of the anterior and
superior labrum. Milder degree of left hip osteoarthritis also
noted.

## 2022-04-21 DIAGNOSIS — L820 Inflamed seborrheic keratosis: Secondary | ICD-10-CM | POA: Diagnosis not present

## 2022-04-21 DIAGNOSIS — B353 Tinea pedis: Secondary | ICD-10-CM | POA: Diagnosis not present

## 2022-04-21 DIAGNOSIS — L570 Actinic keratosis: Secondary | ICD-10-CM | POA: Diagnosis not present

## 2022-04-21 DIAGNOSIS — D1801 Hemangioma of skin and subcutaneous tissue: Secondary | ICD-10-CM | POA: Diagnosis not present

## 2022-04-21 DIAGNOSIS — L821 Other seborrheic keratosis: Secondary | ICD-10-CM | POA: Diagnosis not present

## 2022-05-12 DIAGNOSIS — L820 Inflamed seborrheic keratosis: Secondary | ICD-10-CM | POA: Diagnosis not present

## 2022-05-12 DIAGNOSIS — L821 Other seborrheic keratosis: Secondary | ICD-10-CM | POA: Diagnosis not present

## 2022-09-08 ENCOUNTER — Telehealth: Payer: Self-pay | Admitting: Internal Medicine

## 2022-09-08 NOTE — Telephone Encounter (Signed)
Patient is having her physical on 11/08/2022 - she wants to know if you could put orders in for her blood work so that she can get them the done the week before the appointment.  Patient would like to be notified on my chart when orders have been placed.

## 2022-09-09 ENCOUNTER — Other Ambulatory Visit: Payer: Self-pay

## 2022-09-09 ENCOUNTER — Encounter: Payer: Self-pay | Admitting: Internal Medicine

## 2022-09-09 DIAGNOSIS — R739 Hyperglycemia, unspecified: Secondary | ICD-10-CM

## 2022-09-09 DIAGNOSIS — E042 Nontoxic multinodular goiter: Secondary | ICD-10-CM

## 2022-09-09 DIAGNOSIS — I10 Essential (primary) hypertension: Secondary | ICD-10-CM

## 2022-09-09 DIAGNOSIS — E041 Nontoxic single thyroid nodule: Secondary | ICD-10-CM

## 2022-09-09 DIAGNOSIS — E7849 Other hyperlipidemia: Secondary | ICD-10-CM

## 2022-09-09 NOTE — Telephone Encounter (Signed)
Labs placed and my chart message sent to patient.  

## 2022-09-16 DIAGNOSIS — L720 Epidermal cyst: Secondary | ICD-10-CM | POA: Diagnosis not present

## 2022-09-16 DIAGNOSIS — L821 Other seborrheic keratosis: Secondary | ICD-10-CM | POA: Diagnosis not present

## 2022-09-29 ENCOUNTER — Encounter: Payer: Self-pay | Admitting: Internal Medicine

## 2022-10-01 ENCOUNTER — Ambulatory Visit: Payer: Medicare PPO | Admitting: Nurse Practitioner

## 2022-10-01 VITALS — BP 118/78 | HR 59 | Temp 97.7°F | Ht 62.0 in | Wt 165.0 lb

## 2022-10-01 DIAGNOSIS — N3090 Cystitis, unspecified without hematuria: Secondary | ICD-10-CM | POA: Diagnosis not present

## 2022-10-01 DIAGNOSIS — R300 Dysuria: Secondary | ICD-10-CM

## 2022-10-01 LAB — URINALYSIS, ROUTINE W REFLEX MICROSCOPIC
Bilirubin Urine: NEGATIVE
Hgb urine dipstick: NEGATIVE
Ketones, ur: NEGATIVE
Leukocytes,Ua: NEGATIVE
Nitrite: NEGATIVE
RBC / HPF: NONE SEEN (ref 0–?)
Specific Gravity, Urine: 1.01 (ref 1.000–1.030)
Total Protein, Urine: NEGATIVE
Urine Glucose: NEGATIVE
Urobilinogen, UA: 0.2 (ref 0.0–1.0)
pH: 7 (ref 5.0–8.0)

## 2022-10-01 LAB — POC URINALSYSI DIPSTICK (AUTOMATED)
Bilirubin, UA: NEGATIVE
Blood, UA: NEGATIVE
Glucose, UA: NEGATIVE
Ketones, UA: NEGATIVE
Leukocytes, UA: NEGATIVE
Nitrite, UA: NEGATIVE
Protein, UA: NEGATIVE
Spec Grav, UA: 1.015 (ref 1.010–1.025)
Urobilinogen, UA: 0.2 U/dL
pH, UA: 7 (ref 5.0–8.0)

## 2022-10-01 NOTE — Progress Notes (Signed)
Established Patient Office Visit  Subjective   Patient ID: Kimberly Stephenson, female    DOB: 04-01-1945  Age: 77 y.o. MRN: [id number]  Chief Complaint  Patient presents with   Urinary Tract Infection    Mild painful urination, weak flow, burning, been going on for more than one month     Symptom onset about 1.5 months ago.  Is having dysuria, frequency, and urgency.  No hematuria.  Does have chronic right-sided low back pain.  Feeling general malaise and fatigue.  Would like to see urogynecologist for further evaluation.  Creatinine clearance 59 based on last metabolic panel.    Review of Systems  Constitutional:  Positive for malaise/fatigue.  Genitourinary:  Positive for dysuria, frequency and urgency. Negative for flank pain and hematuria.      Objective:     BP 118/78   Pulse (!) 59   Temp 97.7 F (36.5 C) (Temporal)   Ht 5\' 2"  (1.575 m)   Wt 165 lb (74.8 kg)   SpO2 97%   BMI 30.18 kg/m    Physical Exam Vitals reviewed.  Constitutional:      General: She is not in acute distress.    Appearance: Normal appearance.  HENT:     Head: Normocephalic and atraumatic.  Neck:     Vascular: No carotid bruit.  Cardiovascular:     Rate and Rhythm: Normal rate and regular rhythm.     Pulses: Normal pulses.     Heart sounds: Normal heart sounds.  Pulmonary:     Effort: Pulmonary effort is normal.     Breath sounds: Normal breath sounds.  Abdominal:     Tenderness: There is no right CVA tenderness or left CVA tenderness.  Skin:    General: Skin is warm and dry.  Neurological:     General: No focal deficit present.     Mental Status: She is alert and oriented to person, place, and time.  Psychiatric:        Mood and Affect: Mood normal.        Behavior: Behavior normal.        Judgment: Judgment normal.      Results for orders placed or performed in visit on 10/01/22  Urinalysis, Routine w reflex microscopic  Result Value Ref Range   Color, Urine YELLOW  Yellow;Lt. Yellow;Straw;Dark Yellow;Amber;Green;Red;Brown   APPearance CLEAR Clear;Turbid;Slightly Cloudy;Cloudy   Specific Gravity, Urine 1.010 1.000 - 1.030   pH 7.0 5.0 - 8.0   Total Protein, Urine NEGATIVE Negative   Urine Glucose NEGATIVE Negative   Ketones, ur NEGATIVE Negative   Bilirubin Urine NEGATIVE Negative   Hgb urine dipstick NEGATIVE Negative   Urobilinogen, UA 0.2 0.0 - 1.0   Leukocytes,Ua NEGATIVE Negative   Nitrite NEGATIVE Negative   WBC, UA 0-2/hpf 0-2/hpf   RBC / HPF none seen 0-2/hpf   Mucus, UA Presence of (A) None   Squamous Epithelial / HPF Rare(0-4/hpf) Rare(0-4/hpf)   Renal Epithel, UA Rare(0-4/hpf) (A) None  POCT Urinalysis Dipstick (Automated)  Result Value Ref Range   Color, UA     Clarity, UA     Glucose, UA Negative Negative   Bilirubin, UA negative    Ketones, UA negative    Spec Grav, UA 1.015 1.010 - 1.025   Blood, UA negative    pH, UA 7.0 5.0 - 8.0   Protein, UA Negative Negative   Urobilinogen, UA 0.2 0.2 or 1.0 E.U./dL   Nitrite, UA negative    Leukocytes,  UA Negative Negative      The 10-year ASCVD risk score (Arnett DK, et al., 2019) is: 16.4%    Assessment & Plan:   Problem List Items Addressed This Visit       Genitourinary   Cystitis - Primary    Acute Will order referral to urogynecology as per patient request Point-of-care UA: Negative for signs of UTI Will send urine out for culture, check BMP to monitor stable kidney function.  Further recommendations may be made based upon these results.      Relevant Orders   Urinalysis, Routine w reflex microscopic (Completed)   Urine Culture   Ambulatory referral to Urogynecology   Basic metabolic panel   Other Visit Diagnoses     Dysuria       Relevant Orders   POCT Urinalysis Dipstick (Automated) (Completed)       Return if symptoms worsen or fail to improve.    Elenore Paddy, NP

## 2022-10-01 NOTE — Assessment & Plan Note (Addendum)
Acute Will order referral to urogynecology as per patient request Point-of-care UA: Negative for signs of UTI Will send urine out for culture, check BMP to monitor stable kidney function.  Further recommendations may be made based upon these results.

## 2022-10-02 LAB — URINE CULTURE

## 2022-10-05 ENCOUNTER — Other Ambulatory Visit: Payer: Self-pay | Admitting: Internal Medicine

## 2022-10-05 DIAGNOSIS — Z0000 Encounter for general adult medical examination without abnormal findings: Secondary | ICD-10-CM

## 2022-10-06 ENCOUNTER — Ambulatory Visit
Admission: RE | Admit: 2022-10-06 | Discharge: 2022-10-06 | Disposition: A | Discharge status: Home / Self Care | Payer: Medicare PPO | Source: Ambulatory Visit | Attending: Internal Medicine | Admitting: Internal Medicine

## 2022-10-06 DIAGNOSIS — Z0000 Encounter for general adult medical examination without abnormal findings: Secondary | ICD-10-CM

## 2022-10-08 ENCOUNTER — Encounter: Payer: Self-pay | Admitting: Internal Medicine

## 2022-10-12 ENCOUNTER — Other Ambulatory Visit: Payer: Self-pay | Admitting: Internal Medicine

## 2022-10-12 DIAGNOSIS — N644 Mastodynia: Secondary | ICD-10-CM

## 2022-10-12 DIAGNOSIS — N6320 Unspecified lump in the left breast, unspecified quadrant: Secondary | ICD-10-CM

## 2022-10-22 ENCOUNTER — Encounter: Payer: Self-pay | Admitting: Obstetrics

## 2022-10-22 ENCOUNTER — Ambulatory Visit: Payer: Medicare PPO | Admitting: Obstetrics

## 2022-10-22 VITALS — BP 136/72 | HR 76 | Ht <= 58 in | Wt 170.0 lb

## 2022-10-22 DIAGNOSIS — R350 Frequency of micturition: Secondary | ICD-10-CM | POA: Insufficient documentation

## 2022-10-22 DIAGNOSIS — N952 Postmenopausal atrophic vaginitis: Secondary | ICD-10-CM | POA: Insufficient documentation

## 2022-10-22 LAB — POCT URINALYSIS DIPSTICK
Bilirubin, UA: NEGATIVE
Blood, UA: NEGATIVE
Glucose, UA: NEGATIVE
Ketones, UA: NEGATIVE
Leukocytes, UA: NEGATIVE
Nitrite, UA: NEGATIVE
Protein, UA: NEGATIVE
Spec Grav, UA: 1.01 (ref 1.010–1.025)
Urobilinogen, UA: 0.2 U/dL
pH, UA: 7 (ref 5.0–8.0)

## 2022-10-22 MED ORDER — ESTRADIOL 0.1 MG/GM VA CREA
0.5000 g | TOPICAL_CREAM | VAGINAL | 3 refills | Status: DC
Start: 2022-10-25 — End: 2023-11-08

## 2022-10-22 NOTE — Assessment & Plan Note (Signed)
-   For symptomatic vaginal atrophy options include lubrication with a water-based lubricant, personal hygiene measures and barrier protection against wetness, and estrogen replacement in the form of vaginal cream, vaginal tablets, or a time-released vaginal ring.   - start vaginal estrogen with Rx sent - encouraged barrier ointment as needed for comfort

## 2022-10-22 NOTE — Assessment & Plan Note (Addendum)
-   We discussed the symptoms of overactive bladder (OAB), which include urinary urgency, urinary frequency, nocturia, with or without urge incontinence.  While we do not know the exact etiology of OAB, several treatment options exist. We discussed management including behavioral therapy (decreasing bladder irritants, urge suppression strategies, timed voids, bladder retraining), physical therapy, medication; for refractory cases posterior tibial nerve stimulation, sacral neuromodulation, and intravesical botulinum toxin injection.  - reviewed instructions for bladder training with bladder diary provided - avoid Kegels due to increased pelvic floor muscle tone

## 2022-10-22 NOTE — Progress Notes (Signed)
New Patient Evaluation and Consultation  Referring Provider: Elenore Paddy, NP PCP: Pincus Sanes, MD Date of Service: 10/22/2022  SUBJECTIVE Chief Complaint: New Patient (Initial Visit) Kimberly Stephenson is a 77 y.o. female here today for urinary burning and odor. )  History of Present Illness: Kimberly Stephenson is a 74 y.o. White or Caucasian female seen in consultation at the request of Jiles Prows, NP for evaluation of intermittent burning pain, malodorous urine, and weak stream.   Negative UA on 10/01/22 with mixed flora. 10/09/20 UA + leuk/uro with no growth Last UTI 2 years prior Attributed recent episode of UTI symptoms to yeast symptoms treated with Ketoconazole cream and powder   Review of records significant for:   Urinary Symptoms: Does not leak urine.   Day time voids 6-10, pt denies issues.  Nocturia: 1 times per night to void. Voiding dysfunction:  empties bladder well.  Patient does not use a catheter to empty bladder.  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   Denies history of blood in urine, kidney or bladder stones, pyelonephritis, bladder cancer, and kidney cancer  Pelvic Organ Prolapse Symptoms:                  Patient Denies a feeling of a bulge the vaginal area.   Bowel Symptom: Bowel movements: 1-5 time(s) per day with IBS with h/o campylobacter ischemic colitis with bloody diarrhea Stool consistency: soft  Straining: no.  Splinting: yes.  Incomplete evacuation: no.  Patient Denies accidental bowel leakage / fecal incontinence Bowel regimen: diet Last colonoscopy Results normal per patient. Negative cologuard HM Colonoscopy          Discontinued - Colonoscopy  Discontinued      Frequency changed to Never automatically (Topic No Longer Applies)   10/04/2001  HM Colonoscopy component of HM  COLONOSCOPY   Only the first 1 history entries have been loaded, but more history exists.            Sexual Function Sexually active: no.  Sexual orientation: Straight Pain with sex: No  Pelvic Pain Denies pelvic pain   Past Medical History:  Past Medical History:  Diagnosis Date   Amaurosis fugax    x 3 ; resolved with BP meds   Colitis, ischemic (HCC) 2010   Dr. Kinnie Scales   Irritable bowel syndrome    Dr Kinnie Scales, GI   Milk intolerance    Nonspecific elevation of levels of transaminase or lactic acid dehydrogenase (LDH)    Other and unspecified hyperlipidemia    [place] study LDL goal=<130; NMR LDL  goal = < 160, ideally < 130   Other and unspecified ovarian cyst    PMHx   Polydipsia    Rosacea    Sebaceous cyst     left buttock   Unspecified essential hypertension      Past Surgical History:   Past Surgical History:  Procedure Laterality Date   APPENDECTOMY  1980   CESAREAN SECTION     x 2; G2 P2   COLONOSCOPY  2003, 2008   due 2018; Dr Kinnie Scales   Peri ovarian cyst right resected  1980   TONSILLECTOMY AND ADENOIDECTOMY     UPPER GASTROINTESTINAL ENDOSCOPY  1986     Past OB/GYN History: OB History  Gravida Para Term Preterm AB Living  2 2 2     2   SAB IAB Ectopic Multiple Live Births          2    # Outcome Date GA Lbr Len/2nd Weight Sex Type Anes PTL Lv  2 Term      CS-Classical     1 Term      CS-Classical       Vaginal deliveries: 0,  Forceps/ Vacuum deliveries: 0, Cesarean section: 2 Menopausal: Yes, at age 89 Contraception: n/a. Last pap smear was last in 2014 or 2015.  Any history of abnormal pap smears: no.  Medications: Patient has a current medication list which includes the following prescription(s): aspirin-acetaminophen-caffeine, vitamin d3, clindamycin-benzoyl per (refr), comirnaty, diphenhydramine hcl, [START ON 10/25/2022] estradiol, hydrochlorothiazide, ketoconazole, levocetirizine, metoprolol succinate, multiple vitamins-minerals,  and zylet.   Allergies: Patient is allergic to cephalosporins, lamisil [terbinafine], sulfonamide derivatives, simvastatin, ezetimibe, and ramipril.   Social History:  Social History   Tobacco Use   Smoking status: Former    Current packs/day: 0.00    Types: Cigarettes    Quit date: 01/04/1969    Years since quitting: 53.8   Smokeless tobacco: Never   Tobacco comments:    smoked 1969- 1971, up to 2 ppd  Vaping Use   Vaping status: Never Used  Substance Use Topics   Alcohol use: No    Comment:  very rarely, < 1 glass of wine/ month   Drug use: No    Relationship status: divorced Patient lives with her dogs.   Patient is not employed. Regular exercise: No History of abuse: No  Family History:   Family History  Problem Relation Age of Onset   Diabetes Father    Dementia Father    Heart attack Father 39   Heart disease Father    Basal cell carcinoma Mother    Hypertension Mother 34       Angioplasty   Lung cancer Mother        Small cell   Heart attack Mother 46   Cancer Mother        lung and skin (basal cell carcinoma)   Diabetes Sister    Diabetes Maternal Grandfather    Heart attack Maternal Aunt         X 2;ages 56  & 65   Lung cancer Maternal Aunt        smoker   Stroke Paternal Grandfather    Dementia Paternal Grandfather    Heart failure Maternal Aunt 46   Obesity Sister        x 2   Thyroid nodules Sister    Prostate cancer Other        nephew   Other Other        PTH nodule, nephew   Diabetes Paternal Grandmother      Review of Systems: Review of Systems  Constitutional:  Positive for malaise/fatigue. Negative for fever and weight loss.       Weight gain  Respiratory:  Positive for cough and shortness of breath. Negative for wheezing.   Cardiovascular:  Negative for chest pain, palpitations and leg swelling.  Gastrointestinal:  Negative for abdominal pain and blood in stool.  Genitourinary:  Positive for dysuria (intermittent) and urgency.  Negative for frequency and hematuria.  Skin:  Negative for rash.  Neurological:  Positive for headaches. Negative for dizziness and weakness.  Endo/Heme/Allergies:  Bruises/bleeds easily.  Psychiatric/Behavioral:  Negative for depression. The patient is nervous/anxious.  OBJECTIVE Physical Exam: Vitals:   10/22/22 1115 10/22/22 1206  BP: (!) 169/83 136/72  Pulse: 65 76  Weight: 170 lb (77.1 kg)   Height: 1' 11.23" (0.59 m)     Physical Exam Constitutional:      General: She is not in acute distress.    Appearance: Normal appearance.  Genitourinary:     Bladder and urethral meatus normal.     No lesions in the vagina.     Right Labia: No rash, tenderness, lesions, skin changes or Bartholin's cyst.    Left Labia: No tenderness, lesions, skin changes, Bartholin's cyst or rash.    No vaginal discharge, erythema, tenderness, bleeding, ulceration or granulation tissue.     No vaginal prolapse present.    Severe vaginal atrophy present.     Right Adnexa: not tender, not full and no mass present.    Left Adnexa: not tender, not full and no mass present.    No cervical motion tenderness, discharge, friability, lesion, polyp or nabothian cyst.     Uterus is not enlarged, fixed, tender or irregular.     No uterine mass detected.    Urethral meatus caruncle not present.    No urethral prolapse, tenderness, mass, hypermobility or discharge present.     Bladder is not tender, urgency on palpation not present and masses not present.      Levator ani is tender (left sided pelvic floor muscle).     Obturator internus not tender, no asymmetrical contractions present and no pelvic spasms present.    Anal wink present and BC reflex present. Cardiovascular:     Rate and Rhythm: Normal rate.  Pulmonary:     Effort: Pulmonary effort is normal. No respiratory distress.  Abdominal:     General: There is no distension.     Palpations: There is no mass.     Tenderness: There is no abdominal  tenderness.     Hernia: No hernia is present.  Neurological:     Mental Status: She is alert.  Vitals reviewed. Exam conducted with a chaperone present.     POP-Q:   POP-Q  -3                                            Aa   -3                                           Ba  -7                                              C   1                                            Gh  4                                            Pb  8  tvl   -3                                            Ap  -3                                            Bp  -7                                              D   Post-Void Residual (PVR) by Bladder Scan: In order to evaluate bladder emptying, we discussed obtaining a postvoid residual and patient agreed to this procedure.  Procedure: The ultrasound unit was placed on the patient's abdomen in the suprapubic region after the patient had voided.    Post Void Residual - 10/22/22 1229       Post Void Residual   Post Void Residual 18 mL              Laboratory Results: Lab Results  Component Value Date   COLORU yellow 10/22/2022   CLARITYU clear 10/22/2022   GLUCOSEUR Negative 10/22/2022   BILIRUBINUR negative 10/22/2022   KETONESU negative 10/22/2022   SPECGRAV 1.010 10/22/2022   RBCUR negative 10/22/2022   PHUR 7.0 10/22/2022   PROTEINUR Negative 10/22/2022   UROBILINOGEN 0.2 10/22/2022   LEUKOCYTESUR Negative 10/22/2022    Lab Results  Component Value Date   CREATININE 0.79 03/11/2022   CREATININE 0.77 05/29/2021   CREATININE 0.83 07/17/2019    Lab Results  Component Value Date   HGBA1C 5.3 05/29/2021    Lab Results  Component Value Date   HGB 13.6 05/29/2021     ASSESSMENT AND PLAN Ms. Mean is a 77 y.o. with:  1. Urinary frequency   2. Vaginal atrophy    Urinary frequency Assessment & Plan: We discussed the symptoms of overactive bladder (OAB), which include urinary  urgency, urinary frequency, nocturia, with or without urge incontinence.  While we do not know the exact etiology of OAB, several treatment options exist. We discussed management including behavioral therapy (decreasing bladder irritants, urge suppression strategies, timed voids, bladder retraining), physical therapy, medication; for refractory cases posterior tibial nerve stimulation, sacral neuromodulation, and intravesical botulinum toxin injection.   Orders: -     POCT urinalysis dipstick  Vaginal atrophy Assessment & Plan: - For symptomatic vaginal atrophy options include lubrication with a water-based lubricant, personal hygiene measures and barrier protection against wetness, and estrogen replacement in the form of vaginal cream, vaginal tablets, or a time-released vaginal ring.   - start vaginal estrogen with Rx sent - encouraged barrier ointment as needed for comfort   Other orders -     Estradiol; Place 0.5 g vaginally 2 (two) times a week. Place 0.5g nightly for two weeks then twice a week after  Dispense: 42 g; Refill: 3  Time spent: I spent 58 minutes dedicated to the care of this patient on the date of this encounter to include pre-visit review of records, face-to-face time with the patient discussing urinary frequency, vaginal atrophy and post visit documentation and ordering medication/ testing.   F/u in 3 months or PRN nurse  visit for cath samples if UTI symptoms  Loleta Chance, MD

## 2022-10-22 NOTE — Patient Instructions (Addendum)
For symptomatic vaginal atrophy options include lubrication with a water-based lubricant, personal hygiene measures and barrier protection against wetness, and estrogen replacement in the form of vaginal cream, vaginal tablets, or a time-released vaginal ring.     For vaginal atrophy (thinning of the vaginal tissue that can cause dryness and burning) and UTI prevention we discussed estrogen replacement in the form of vaginal cream.   Start vaginal estrogen therapy nightly for two weeks then 2 times weekly at night. This can be placed with your finger or an applicator inside the vagina and around the urethra.  Please let us know if the prescription is too expensive and we can look for alternative options.   Is vaginal estrogen therapy safe for me? Vaginal estrogen preparations act on the vaginal skin, and only a very tiny amount is absorbed into the bloodstream (0.01%).  They work in a similar way to hand or face cream.  There is minimal absorption and they are therefore perfectly safe. If you have had breast cancer and have persistent troublesome symptoms which aren't settling with vaginal moisturisers and lubricants, local estrogen treatment may be a possibility, but consultation with your oncologist should take place first.   We discussed the symptoms of overactive bladder (OAB), which include urinary urgency, urinary frequency, night-time urination, with or without urge incontinence.  We discussed management including behavioral therapy (decreasing bladder irritants by following a bladder diet, urge suppression strategies, timed voids, bladder retraining), physical therapy, medication; and for refractory cases posterior tibial nerve stimulation, sacral neuromodulation, and intravesical botulinum toxin injection.   Start bladder training and consider bladder diary

## 2022-10-25 ENCOUNTER — Ambulatory Visit
Admission: RE | Admit: 2022-10-25 | Discharge: 2022-10-25 | Disposition: A | Discharge status: Home / Self Care | Payer: Medicare PPO | Source: Ambulatory Visit | Attending: Internal Medicine | Admitting: Internal Medicine

## 2022-10-25 ENCOUNTER — Ambulatory Visit: Payer: Medicare PPO

## 2022-10-25 DIAGNOSIS — N644 Mastodynia: Secondary | ICD-10-CM

## 2022-10-25 DIAGNOSIS — N6320 Unspecified lump in the left breast, unspecified quadrant: Secondary | ICD-10-CM

## 2022-11-07 DIAGNOSIS — Z23 Encounter for immunization: Secondary | ICD-10-CM | POA: Diagnosis not present

## 2022-11-07 DIAGNOSIS — S81811A Laceration without foreign body, right lower leg, initial encounter: Secondary | ICD-10-CM | POA: Diagnosis not present

## 2022-11-07 DIAGNOSIS — W19XXXA Unspecified fall, initial encounter: Secondary | ICD-10-CM | POA: Diagnosis not present

## 2022-11-08 ENCOUNTER — Encounter: Payer: Medicare PPO | Admitting: Internal Medicine

## 2022-11-09 ENCOUNTER — Other Ambulatory Visit: Payer: Self-pay | Admitting: Internal Medicine

## 2022-11-15 ENCOUNTER — Encounter: Payer: Self-pay | Admitting: Internal Medicine

## 2022-11-17 NOTE — Progress Notes (Unsigned)
    Subjective:    Patient ID: Kimberly Stephenson, female    DOB: 1945/04/06, 77 y.o.   MRN: [id number]      HPI Kimberly Stephenson is here for No chief complaint on file.   11/3 fell at church.  She fell over an 8 inch wooden podium and gashed her right shin.  She went to urgent care.  Last tetanus 13 years ago-she did get Td at urgent care.  Laceration is 12 inches.  After wound was cleaned out-9 Steri-Strips used.  She was placed on clindamycin 300 mg 3 times a day for 7 days.        Medications and allergies reviewed with patient and updated if appropriate.  Current Outpatient Medications on File Prior to Visit  Medication Sig Dispense Refill   aspirin-acetaminophen-caffeine (EXCEDRIN MIGRAINE) 250-250-65 MG tablet Take 1 tablet by mouth daily. Reported on 04/08/2015     Cholecalciferol (VITAMIN D3) 25 MCG (1000 UT) CAPS Take 1 capsule (1,000 Units total) by mouth daily. 60 capsule    Clindamycin-Benzoyl Per, Refr, gel Apply topically 2 (two) times daily.     COMIRNATY SUSP injection Inject into the muscle.     diphenhydrAMINE HCl (BENADRYL ALLERGY PO) Take by mouth.     estradiol (ESTRACE) 0.1 MG/GM vaginal cream Place 0.5 g vaginally 2 (two) times a week. Place 0.5g nightly for two weeks then twice a week after 42 g 3   hydrochlorothiazide (HYDRODIURIL) 12.5 MG tablet Take 1 tablet (12.5 mg total) by mouth daily. 90 tablet 3   ketoconazole (NIZORAL) 2 % cream APPLY TOPICALLY TO THE AFFECTED AREA TWICE DAILY     levocetirizine (XYZAL) 5 MG tablet TAKE 1 TABLET(5 MG) BY MOUTH EVERY EVENING 30 tablet 5   metoprolol succinate (TOPROL-XL) 100 MG 24 hr tablet Take 1.5 tablets (150 mg total) by mouth 2 (two) times daily. Take with or immediately following a meal. 270 tablet 3   Multiple Vitamins-Calcium (ONE-A-DAY WOMENS PO) Take 1 each by mouth daily.       ZYLET 0.5-0.3 % SUSP Apply 1 drop to eye 4 (four) times daily.     No current facility-administered medications on file prior to visit.     Review of Systems     Objective:  There were no vitals filed for this visit. BP Readings from Last 3 Encounters:  10/22/22 136/72  10/01/22 118/78  02/25/22 (!) 162/90   Wt Readings from Last 3 Encounters:  10/22/22 170 lb (77.1 kg)  10/01/22 165 lb (74.8 kg)  02/25/22 166 lb (75.3 kg)   There is no height or weight on file to calculate BMI.    Physical Exam         Assessment & Plan:    See Problem List for Assessment and Plan of chronic medical problems.

## 2022-11-18 ENCOUNTER — Ambulatory Visit: Payer: Medicare PPO | Admitting: Internal Medicine

## 2022-11-18 ENCOUNTER — Ambulatory Visit: Payer: Medicare PPO

## 2022-11-18 ENCOUNTER — Encounter: Payer: Self-pay | Admitting: Internal Medicine

## 2022-11-18 VITALS — BP 130/74 | HR 67 | Ht 59.0 in | Wt 167.0 lb

## 2022-11-18 VITALS — BP 130/74 | HR 67 | Temp 98.4°F | Ht 59.0 in | Wt 167.2 lb

## 2022-11-18 DIAGNOSIS — Z0000 Encounter for general adult medical examination without abnormal findings: Secondary | ICD-10-CM | POA: Diagnosis not present

## 2022-11-18 DIAGNOSIS — E042 Nontoxic multinodular goiter: Secondary | ICD-10-CM

## 2022-11-18 DIAGNOSIS — I10 Essential (primary) hypertension: Secondary | ICD-10-CM

## 2022-11-18 DIAGNOSIS — R739 Hyperglycemia, unspecified: Secondary | ICD-10-CM

## 2022-11-18 DIAGNOSIS — E041 Nontoxic single thyroid nodule: Secondary | ICD-10-CM | POA: Diagnosis not present

## 2022-11-18 DIAGNOSIS — S81811A Laceration without foreign body, right lower leg, initial encounter: Secondary | ICD-10-CM | POA: Diagnosis not present

## 2022-11-18 DIAGNOSIS — E7849 Other hyperlipidemia: Secondary | ICD-10-CM

## 2022-11-18 LAB — COMPREHENSIVE METABOLIC PANEL
ALT: 19 U/L (ref 0–35)
AST: 22 U/L (ref 0–37)
Albumin: 4.1 g/dL (ref 3.5–5.2)
Alkaline Phosphatase: 67 U/L (ref 39–117)
BUN: 11 mg/dL (ref 6–23)
CO2: 30 meq/L (ref 19–32)
Calcium: 9.7 mg/dL (ref 8.4–10.5)
Chloride: 100 meq/L (ref 96–112)
Creatinine, Ser: 0.7 mg/dL (ref 0.40–1.20)
GFR: 84.84 mL/min (ref >60.00)
Glucose, Bld: 92 mg/dL (ref 70–99)
Potassium: 4.2 meq/L (ref 3.5–5.1)
Sodium: 139 meq/L (ref 135–145)
Total Bilirubin: 0.7 mg/dL (ref 0.2–1.2)
Total Protein: 7.2 g/dL (ref 6.0–8.3)

## 2022-11-18 LAB — TSH: TSH: 2.69 u[IU]/mL (ref 0.35–5.50)

## 2022-11-18 LAB — CBC WITH DIFFERENTIAL/PLATELET
Basophils Absolute: 0.1 10*3/uL (ref 0.0–0.1)
Basophils Relative: 0.9 % (ref 0.0–3.0)
Eosinophils Absolute: 0.2 10*3/uL (ref 0.0–0.7)
Eosinophils Relative: 2.3 % (ref 0.0–5.0)
HCT: 40.7 % (ref 36.0–46.0)
Hemoglobin: 14 g/dL (ref 12.0–15.0)
Lymphocytes Relative: 24.8 % (ref 12.0–46.0)
Lymphs Abs: 2 10*3/uL (ref 0.7–4.0)
MCHC: 34.4 g/dL (ref 30.0–36.0)
MCV: 90.8 fL (ref 78.0–100.0)
Monocytes Absolute: 0.6 10*3/uL (ref 0.1–1.0)
Monocytes Relative: 8 % (ref 3.0–12.0)
Neutro Abs: 5.1 10*3/uL (ref 1.4–7.7)
Neutrophils Relative %: 64 % (ref 43.0–77.0)
Platelets: 306 10*3/uL (ref 150.0–400.0)
RBC: 4.48 Mil/uL (ref 3.87–5.11)
RDW: 13 % (ref 11.5–15.5)
WBC: 7.9 10*3/uL (ref 4.0–10.5)

## 2022-11-18 LAB — LIPID PANEL
Cholesterol: 195 mg/dL (ref 0–200)
HDL: 52.1 mg/dL (ref >39.00)
LDL Cholesterol: 114 mg/dL — ABNORMAL HIGH (ref 0–99)
NonHDL: 142.98
Total CHOL/HDL Ratio: 4
Triglycerides: 144 mg/dL (ref 0.0–149.0)
VLDL: 28.8 mg/dL (ref 0.0–40.0)

## 2022-11-18 LAB — HEMOGLOBIN A1C: Hgb A1c MFr Bld: 5.6 % (ref 4.6–6.5)

## 2022-11-18 NOTE — Patient Instructions (Signed)
Continue wound care. ?

## 2022-11-18 NOTE — Progress Notes (Signed)
Subjective:   Kimberly Stephenson is a 77 y.o. female who presents for Medicare Annual (Subsequent) preventive examination.  Visit Complete: In person  Patient Medicare AWV questionnaire was completed by the patient on 11/17/2022; I have confirmed that all information answered by patient is correct and no changes since this date.  Cardiac Risk Factors include: advanced age (>82men, >40 women);hypertension;dyslipidemia;obesity (BMI >30kg/m2);Other (see comment), Risk factor comments: CAD,     Objective:    Today's Vitals   11/18/22 1139  BP: 130/74  Pulse: 67  SpO2: 97%  Weight: 167 lb (75.8 kg)  Height: 4\' 11"  (1.499 m)   Body mass index is 33.73 kg/m.     11/18/2022   11:18 AM  Advanced Directives  Does Patient Have a Medical Advance Directive? Yes  Type of Estate agent of [place];Living will  Copy of Healthcare Power of Attorney in Chart? No - copy requested    Current Medications (verified) Outpatient Encounter Medications as of 11/18/2022  Medication Sig   aspirin-acetaminophen-caffeine (EXCEDRIN MIGRAINE) 250-250-65 MG tablet Take 1 tablet by mouth daily. Reported on 04/08/2015   Cholecalciferol (VITAMIN D3) 25 MCG (1000 UT) CAPS Take 1 capsule (1,000 Units total) by mouth daily.   Clindamycin-Benzoyl Per, Refr, gel Apply topically 2 (two) times daily.   COMIRNATY SUSP injection Inject into the muscle.   diphenhydrAMINE HCl (BENADRYL ALLERGY PO) Take by mouth.   estradiol (ESTRACE) 0.1 MG/GM vaginal cream Place 0.5 g vaginally 2 (two) times a week. Place 0.5g nightly for two weeks then twice a week after   hydrochlorothiazide (HYDRODIURIL) 12.5 MG tablet Take 1 tablet (12.5 mg total) by mouth daily.   ketoconazole (NIZORAL) 2 % cream APPLY TOPICALLY TO THE AFFECTED AREA TWICE DAILY   levocetirizine (XYZAL) 5 MG tablet TAKE 1 TABLET(5 MG) BY MOUTH EVERY EVENING   metoprolol succinate (TOPROL-XL) 100 MG 24 hr tablet Take 1.5 tablets (150 mg total)  by mouth 2 (two) times daily. Take with or immediately following a meal.   Multiple Vitamins-Calcium (ONE-A-DAY WOMENS PO) Take 1 each by mouth daily.     ZYLET 0.5-0.3 % SUSP Apply 1 drop to eye 4 (four) times daily.   No facility-administered encounter medications on file as of 11/18/2022.    Allergies (verified) Cephalosporins, Lamisil [terbinafine], Sulfonamide derivatives, Simvastatin, Ezetimibe, and Ramipril   History: Past Medical History:  Diagnosis Date   Amaurosis fugax    x 3 ; resolved with BP meds   Colitis, ischemic (HCC) 2010   Dr. Kinnie Scales   Irritable bowel syndrome    Dr Kinnie Scales, GI   Milk intolerance    Nonspecific elevation of levels of transaminase or lactic acid dehydrogenase (LDH)    Other and unspecified hyperlipidemia    [place] study LDL goal=<130; NMR LDL  goal = < 160, ideally < 130   Other and unspecified ovarian cyst    PMHx   Polydipsia    Rosacea    Sebaceous cyst     left buttock   Unspecified essential hypertension    Past Surgical History:  Procedure Laterality Date   APPENDECTOMY  1980   CESAREAN SECTION     x 2; G2 P2   COLONOSCOPY  2003, 2008   due 2018; Dr Kinnie Scales   Peri ovarian cyst right resected  1980   TONSILLECTOMY AND ADENOIDECTOMY     UPPER GASTROINTESTINAL ENDOSCOPY  1986   Family History  Problem Relation Age of Onset   Diabetes Father    Dementia  Father    Heart attack Father 106   Heart disease Father    Basal cell carcinoma Mother    Hypertension Mother 1       Angioplasty   Lung cancer Mother        Small cell   Heart attack Mother 52   Cancer Mother        lung and skin (basal cell carcinoma)   Diabetes Sister    Diabetes Maternal Grandfather    Heart attack Maternal Aunt         X 2;ages 56  & 65   Lung cancer Maternal Aunt        smoker   Stroke Paternal Grandfather    Dementia Paternal Grandfather    Heart failure Maternal Aunt 61   Obesity Sister        x 2   Thyroid nodules Sister    Prostate  cancer Other        nephew   Other Other        PTH nodule, nephew   Diabetes Paternal Grandmother    Social History   Socioeconomic History   Marital status: Divorced    Spouse name: Not on file   Number of children: Not on file   Years of education: Not on file   Highest education level: Master's degree (e.g., MA, MS, MEng, MEd, MSW, MBA)  Occupational History   Occupation: Retired/ Runner, broadcasting/film/video  Tobacco Use   Smoking status: Former    Current packs/day: 0.00    Types: Cigarettes    Quit date: 01/04/1969    Years since quitting: 53.9   Smokeless tobacco: Never   Tobacco comments:    smoked 1969- 1971, up to 2 ppd  Vaping Use   Vaping status: Never Used  Substance and Sexual Activity   Alcohol use: No    Comment:  very rarely, < 1 glass of wine/ month   Drug use: No   Sexual activity: Never  Other Topics Concern   Not on file  Social History Narrative   Modified atkins      Teacher      Walks 30 minutes daily      Retired/ Lives alone with 2 dogs-2024   Social Determinants of Health   Financial Resource Strain: Low Risk  (11/17/2022)   Overall Financial Resource Strain (CARDIA)    Difficulty of Paying Living Expenses: Not hard at all  Food Insecurity: No Food Insecurity (11/17/2022)   Hunger Vital Sign    Worried About Running Out of Food in the Last Year: Never true    Ran Out of Food in the Last Year: Never true  Transportation Needs: No Transportation Needs (11/17/2022)   PRAPARE - Administrator, Civil Service (Medical): No    Lack of Transportation (Non-Medical): No  Physical Activity: Insufficiently Active (11/17/2022)   Exercise Vital Sign    Days of Exercise per Week: 3 days    Minutes of Exercise per Session: 20 min  Stress: No Stress Concern Present (11/17/2022)   [employer] of Occupational Health - Occupational Stress Questionnaire    Feeling of Stress : Not at all  Social Connections: Moderately Integrated (11/17/2022)   Social  Connection and Isolation Panel [NHANES]    Frequency of Communication with Friends and Family: More than three times a week    Frequency of Social Gatherings with Friends and Family: Twice a week    Attends Religious Services: More than 4 times per year  Active Member of Clubs or Organizations: Yes    Attends Banker Meetings: More than 4 times per year    Marital Status: Divorced    Tobacco Counseling Counseling given: Not Answered Tobacco comments: smoked 1969- 1971, up to 2 ppd   Clinical Intake:  Pre-visit preparation completed: Yes  Pain : No/denies pain     BMI - recorded: 33.73 Nutritional Status: BMI > 30  Obese Nutritional Risks: None Diabetes: No  How often do you need to have someone help you when you read instructions, pamphlets, or other written materials from your doctor or pharmacy?: 1 - Never  Interpreter Needed?: No  Information entered by :: Bobak Oguinn, RMA   Activities of Daily Living    11/17/2022   12:45 PM  In your present state of health, do you have any difficulty performing the following activities:  Hearing? 0  Vision? 0  Difficulty concentrating or making decisions? 0  Walking or climbing stairs? 0  Dressing or bathing? 0  Doing errands, shopping? 0  Preparing Food and eating ? N  Using the Toilet? N  In the past six months, have you accidently leaked urine? N  Do you have problems with loss of bowel control? N  Managing your Medications? N  Managing your Finances? N  Housekeeping or managing your Housekeeping? N    Patient Care Team: Pincus Sanes, MD as PCP - General (Internal Medicine)  Indicate any recent Medical Services you may have received from other than [facility] providers in the past year (date may be approximate).     Assessment:   This is a routine wellness examination for Thu.  Hearing/Vision screen Hearing Screening - Comments:: Denies hearing difficulties   Vision Screening - Comments:: Wears  eyeglasses    Goals Addressed               This Visit's Progress     patient (pt-stated)   On track     Will continue with financial hobby       Depression Screen    11/18/2022   11:20 AM 10/01/2022    2:08 PM 02/25/2022    9:20 AM 06/19/2021    2:23 PM 05/27/2021    1:57 PM 09/27/2020   12:49 PM 07/17/2019    8:40 AM  PHQ 2/9 Scores  PHQ - 2 Score 0 1 0 1 2 2  0  PHQ- 9 Score 1  3 2 9 16      Fall Risk    11/17/2022   12:45 PM 10/01/2022    2:08 PM 02/25/2022    9:19 AM 06/19/2021    2:23 PM 06/19/2021    2:22 PM  Fall Risk   Falls in the past year? 1 1 1  0 0  Number falls in past yr: 1 0 0 0 0  Injury with Fall? 1 0 0 0 0  Risk for fall due to :  History of fall(s) History of fall(s) No Fall Risks No Fall Risks  Follow up Falls evaluation completed;Falls prevention discussed Falls evaluation completed Falls evaluation completed Falls evaluation completed Falls evaluation completed    MEDICARE RISK AT HOME: Medicare Risk at Home Any stairs in or around the home?: Yes If so, are there any without handrails?: No Home free of loose throw rugs in walkways, pet beds, electrical cords, etc?: Yes Adequate lighting in your home to reduce risk of falls?: Yes Life alert?: No Use of a cane, walker or w/c?: No Grab bars in  the bathroom?: No Shower chair or bench in shower?: No Elevated toilet seat or a handicapped toilet?: No  TIMED UP AND GO:  Was the test performed?  Yes  Length of time to ambulate 10 feet: 25 sec Gait steady and fast without use of assistive device    Cognitive Function:    04/08/2015   12:49 PM  MMSE - Mini Mental State Exam  Not completed: --        11/18/2022   11:18 AM  6CIT Screen  What Year? 0 points  What month? 0 points  What time? 0 points  Count back from 20 0 points  Months in reverse 0 points  Repeat phrase 0 points  Total Score 0 points    Immunizations Immunization History  Administered Date(s) Administered   Fluad  Quad(high Dose 65+) 11/11/2021, 09/23/2022   Influenza Whole 10/04/2001   Influenza, High Dose Seasonal PF 09/19/2013, 09/19/2019   Influenza,inj,Quad PF,6+ Mos 09/13/2014, 09/19/2015   Influenza-Unspecified 10/08/2016, 09/23/2017   PFIZER(Purple Top)SARS-COV-2 Vaccination 02/11/2019, 03/08/2019   Pfizer Covid-19 Vaccine Bivalent Booster 1yrs & up 10/29/2021   Pfizer(Comirnaty)Fall Seasonal Vaccine 12 years and older 09/08/2022   Td (Adult),5 Lf Tetanus Toxid, Preservative Free 11/07/2022   Tdap 08/24/2011    TDAP status: Up to date  Flu Vaccine status: Up to date  Pneumococcal vaccine status: Up to date  Covid-19 vaccine status: Completed vaccines  Qualifies for Shingles Vaccine? Yes   Zostavax completed No   Shingrix Completed?: No.    Education has been provided regarding the importance of this vaccine. Patient has been advised to call insurance company to determine out of pocket expense if they have not yet received this vaccine. Advised may also receive vaccine at local pharmacy or Health Dept. Verbalized acceptance and understanding.  Screening Tests Health Maintenance  Topic Date Due   COVID-19 Vaccine (5 - 2023-24 season) 01/08/2023   Medicare Annual Wellness (AWV)  11/18/2023   Fecal DNA (Cologuard)  03/17/2024   DTaP/Tdap/Td (3 - Td or Tdap) 11/06/2032   INFLUENZA VACCINE  Completed   Hepatitis C Screening  Completed   HPV VACCINES  Aged Out   Pneumonia Vaccine 56+ Years old  Discontinued   DEXA SCAN  Discontinued   Colonoscopy  Discontinued   Zoster Vaccines- Shingrix  Discontinued    Health Maintenance  There are no preventive care reminders to display for this patient.   Colorectal cancer screening: No longer required.   Mammogram status: Completed 11/25/2022. Repeat every year   Lung Cancer Screening: (Low Dose CT Chest recommended if Age 36-80 years, 20 pack-year currently smoking OR have quit w/in 15years.) does not qualify.   Lung Cancer Screening  Referral: N/A  Additional Screening:  Hepatitis C Screening: does qualify; Completed 08/22/2013  Vision Screening: Recommended annual ophthalmology exams for early detection of glaucoma and other disorders of the eye. Is the patient up to date with their annual eye exam?  Yes  Who is the provider or what is the name of the office in which the patient attends annual eye exams? Dr. Dione Booze If pt is not established with a provider, would they like to be referred to a provider to establish care? No .   Dental Screening: Recommended annual dental exams for proper oral hygiene   Community Resource Referral / Chronic Care Management: CRR required this visit?  No   CCM required this visit?  No     Plan:     I have personally reviewed and  noted the following in the patient's chart:   Medical and social history Use of alcohol, tobacco or illicit drugs  Current medications and supplements including opioid prescriptions. Patient is not currently taking opioid prescriptions. Functional ability and status Nutritional status Physical activity Advanced directives List of other physicians Hospitalizations, surgeries, and ER visits in previous 12 months Vitals Screenings to include cognitive, depression, and falls Referrals and appointments  In addition, I have reviewed and discussed with patient certain preventive protocols, quality metrics, and best practice recommendations. A written personalized care plan for preventive services as well as general preventive health recommendations were provided to patient.     Adyn Serna L Miya Luviano, CMA   11/18/2022   After Visit Summary: (MyChart) Due to this being a telephonic visit, the after visit summary with patients personalized plan was offered to patient via MyChart   Nurse Notes: Patient is due for a Shingles vaccine and she is aware to get that done at her pharmacy.  Patient declines Bone Density screening.  She is up to date all other health  maintenance.  Patient had no other concerns to address today.

## 2022-11-18 NOTE — Patient Instructions (Addendum)
Kimberly Stephenson , Thank you for taking time to come for your Medicare Wellness Visit. I appreciate your ongoing commitment to your health goals. Please review the following plan we discussed and let me know if I can assist you in the future.   Referrals/Orders/Follow-Ups/Clinician Recommendations: Remember to get that Shingles vaccine at your local pharmacy and mention about putting documentation in your [facility].  It was a pleasure to meet you today.  Keep up the good work.  This is a list of the screening recommended for you and due dates:  Health Maintenance  Topic Date Due   COVID-19 Vaccine (5 - 2023-24 season) 01/08/2023   Medicare Annual Wellness Visit  11/18/2023   Cologuard (Stool DNA test)  03/17/2024   DTaP/Tdap/Td vaccine (3 - Td or Tdap) 11/06/2032   Flu Shot  Completed   Hepatitis C Screening  Completed   HPV Vaccine  Aged Out   Pneumonia Vaccine  Discontinued   DEXA scan (bone density measurement)  Discontinued   Colon Cancer Screening  Discontinued   Zoster (Shingles) Vaccine  Discontinued    Advanced directives: (Copy Requested) Please bring a copy of your health care power of attorney and living will to the office to be added to your chart at your convenience.  Next Medicare Annual Wellness Visit scheduled for next year: Yes

## 2022-11-18 NOTE — Assessment & Plan Note (Signed)
Acute Larey Seat 11/3 and sustained a laceration to her right anterior lower leg-12 inches long To go to urgent care and had a tetanus, was placed on clindamycin for 1 week-wound stabilized with Steri-Strips No evidence of infection Erythema and changes expected with routine healing She will continue to wrap, elevate and monitor closely

## 2022-11-23 ENCOUNTER — Encounter: Payer: Self-pay | Admitting: Internal Medicine

## 2022-11-23 NOTE — Progress Notes (Unsigned)
Subjective:    Patient ID: Kimberly Stephenson, female    DOB: 1945-02-15, 77 y.o.   MRN: [id number]      HPI [name] is here for a Physical exam and her chronic medical problems.    June 2023-2 mm nodule seen in periphery of LLL when she had her CT CAC-no follow-up needed if low risk.    Medications and allergies reviewed with patient and updated if appropriate.  Current Outpatient Medications on File Prior to Visit  Medication Sig Dispense Refill   aspirin-acetaminophen-caffeine (EXCEDRIN MIGRAINE) 250-250-65 MG tablet Take 1 tablet by mouth daily. Reported on 04/08/2015     Cholecalciferol (VITAMIN D3) 25 MCG (1000 UT) CAPS Take 1 capsule (1,000 Units total) by mouth daily. 60 capsule    Clindamycin-Benzoyl Per, Refr, gel Apply topically 2 (two) times daily.     COMIRNATY SUSP injection Inject into the muscle.     diphenhydrAMINE HCl (BENADRYL ALLERGY PO) Take by mouth.     estradiol (ESTRACE) 0.1 MG/GM vaginal cream Place 0.5 g vaginally 2 (two) times a week. Place 0.5g nightly for two weeks then twice a week after 42 g 3   hydrochlorothiazide (HYDRODIURIL) 12.5 MG tablet Take 1 tablet (12.5 mg total) by mouth daily. 90 tablet 3   ketoconazole (NIZORAL) 2 % cream APPLY TOPICALLY TO THE AFFECTED AREA TWICE DAILY     levocetirizine (XYZAL) 5 MG tablet TAKE 1 TABLET(5 MG) BY MOUTH EVERY EVENING 30 tablet 5   metoprolol succinate (TOPROL-XL) 100 MG 24 hr tablet Take 1.5 tablets (150 mg total) by mouth 2 (two) times daily. Take with or immediately following a meal. 270 tablet 3   Multiple Vitamins-Calcium (ONE-A-DAY WOMENS PO) Take 1 each by mouth daily.       ZYLET 0.5-0.3 % SUSP Apply 1 drop to eye 4 (four) times daily.     No current facility-administered medications on file prior to visit.    Review of Systems     Objective:  There were no vitals filed for this visit. There were no vitals filed for this visit. There is no height or weight on file to calculate BMI.  BP  Readings from Last 3 Encounters:  11/18/22 130/74  11/18/22 130/74  10/22/22 136/72    Wt Readings from Last 3 Encounters:  11/18/22 167 lb (75.8 kg)  11/18/22 167 lb 3.2 oz (75.8 kg)  10/22/22 170 lb (77.1 kg)       Physical Exam Constitutional: She appears well-developed and well-nourished. No distress.  HENT:  Head: Normocephalic and atraumatic.  Right Ear: External ear normal. Normal ear canal and TM Left Ear: External ear normal.  Normal ear canal and TM Mouth/Throat: Oropharynx is clear and moist.  Eyes: Conjunctivae normal.  Neck: Neck supple. No tracheal deviation present. No thyromegaly present.  No carotid bruit  Cardiovascular: Normal rate, regular rhythm and normal heart sounds.   No murmur heard.  No edema. Pulmonary/Chest: Effort normal and breath sounds normal. No respiratory distress. She has no wheezes. She has no rales.  Breast: deferred   Abdominal: Soft. She exhibits no distension. There is no tenderness.  Lymphadenopathy: She has no cervical adenopathy.  Skin: Skin is warm and dry. She is not diaphoretic.  Psychiatric: She has a normal mood and affect. Her behavior is normal.     Lab Results  Component Value Date   WBC 7.9 11/18/2022   HGB 14.0 11/18/2022   HCT 40.7 11/18/2022   PLT 306.0 11/18/2022  GLUCOSE 92 11/18/2022   CHOL 195 11/18/2022   TRIG 144.0 11/18/2022   HDL 52.10 11/18/2022   LDLDIRECT 142.7 08/21/2012   LDLCALC 114 (H) 11/18/2022   ALT 19 11/18/2022   AST 22 11/18/2022   NA 139 11/18/2022   K 4.2 11/18/2022   CL 100 11/18/2022   CREATININE 0.70 11/18/2022   BUN 11 11/18/2022   CO2 30 11/18/2022   TSH 2.69 11/18/2022   HGBA1C 5.6 11/18/2022         Assessment & Plan:   Physical exam: Screening blood work  ordered Exercise   Weight   Substance abuse  none   Reviewed recommended immunizations.   Health Maintenance  Topic Date Due   COVID-19 Vaccine (5 - 2023-24 season) 01/08/2023   Medicare Annual  Wellness (AWV)  11/18/2023   Fecal DNA (Cologuard)  03/17/2024   DTaP/Tdap/Td (3 - Td or Tdap) 11/06/2032   INFLUENZA VACCINE  Completed   Hepatitis C Screening  Completed   HPV VACCINES  Aged Out   Pneumonia Vaccine 81+ Years old  Discontinued   DEXA SCAN  Discontinued   Colonoscopy  Discontinued   Zoster Vaccines- Shingrix  Discontinued          See Problem List for Assessment and Plan of chronic medical problems.

## 2022-11-23 NOTE — Patient Instructions (Addendum)

## 2022-11-24 ENCOUNTER — Other Ambulatory Visit: Payer: Self-pay | Admitting: Internal Medicine

## 2022-11-24 ENCOUNTER — Ambulatory Visit (INDEPENDENT_AMBULATORY_CARE_PROVIDER_SITE_OTHER): Payer: Medicare PPO | Admitting: Internal Medicine

## 2022-11-24 VITALS — BP 132/78 | HR 65 | Temp 98.4°F | Ht 59.0 in | Wt 167.2 lb

## 2022-11-24 DIAGNOSIS — E7849 Other hyperlipidemia: Secondary | ICD-10-CM

## 2022-11-24 DIAGNOSIS — I10 Essential (primary) hypertension: Secondary | ICD-10-CM

## 2022-11-24 DIAGNOSIS — S81811A Laceration without foreign body, right lower leg, initial encounter: Secondary | ICD-10-CM | POA: Diagnosis not present

## 2022-11-24 DIAGNOSIS — E041 Nontoxic single thyroid nodule: Secondary | ICD-10-CM | POA: Diagnosis not present

## 2022-11-24 DIAGNOSIS — R739 Hyperglycemia, unspecified: Secondary | ICD-10-CM

## 2022-11-24 DIAGNOSIS — E559 Vitamin D deficiency, unspecified: Secondary | ICD-10-CM | POA: Diagnosis not present

## 2022-11-24 DIAGNOSIS — Z0000 Encounter for general adult medical examination without abnormal findings: Secondary | ICD-10-CM

## 2022-11-24 NOTE — Assessment & Plan Note (Signed)
History of thyroid nodule Last ultrasound approximately 2 years ago and had a biopsy after that time Will recheck ultrasound to make sure there is no changes

## 2022-11-24 NOTE — Assessment & Plan Note (Signed)
Chronic Lab Results  Component Value Date   HGBA1C 5.6 11/18/2022   Sugars improved Low sugar / carb diet Stressed regular exercise

## 2022-11-24 NOTE — Assessment & Plan Note (Signed)
Subacute Improving

## 2022-11-24 NOTE — Assessment & Plan Note (Signed)
Chronic °Continue vitamin D daily °

## 2022-11-24 NOTE — Assessment & Plan Note (Addendum)
Chronic BP controlled Cmp, cbc reviewed-normal Continue hydrochlorothiazide 12.5 mg daily, metoprolol xl 150 mg bid  EKG - sinus bradycardia at 56 bpm, otherwise normal. No change compared to EKG from 2018

## 2022-11-24 NOTE — Assessment & Plan Note (Signed)
Chronic LDL improved Lifestyle controlled Regular exercise and healthy diet encouraged CAC score 0-continue lifestyle control

## 2022-11-25 ENCOUNTER — Ambulatory Visit
Admission: RE | Admit: 2022-11-25 | Discharge: 2022-11-25 | Disposition: A | Discharge status: Home / Self Care | Payer: Medicare PPO | Source: Ambulatory Visit | Attending: Internal Medicine | Admitting: Internal Medicine

## 2022-11-25 DIAGNOSIS — E042 Nontoxic multinodular goiter: Secondary | ICD-10-CM | POA: Diagnosis not present

## 2022-11-25 DIAGNOSIS — E041 Nontoxic single thyroid nodule: Secondary | ICD-10-CM

## 2022-12-04 ENCOUNTER — Encounter: Payer: Self-pay | Admitting: Internal Medicine

## 2022-12-04 DIAGNOSIS — E042 Nontoxic multinodular goiter: Secondary | ICD-10-CM

## 2022-12-10 ENCOUNTER — Encounter (INDEPENDENT_AMBULATORY_CARE_PROVIDER_SITE_OTHER): Payer: Self-pay | Admitting: Otolaryngology

## 2023-01-07 ENCOUNTER — Encounter: Payer: Self-pay | Admitting: Internal Medicine

## 2023-01-13 ENCOUNTER — Institutional Professional Consult (permissible substitution) (INDEPENDENT_AMBULATORY_CARE_PROVIDER_SITE_OTHER): Payer: Medicare PPO | Admitting: Otolaryngology

## 2023-01-14 DIAGNOSIS — E042 Nontoxic multinodular goiter: Secondary | ICD-10-CM | POA: Diagnosis not present

## 2023-02-08 ENCOUNTER — Other Ambulatory Visit: Payer: Self-pay | Admitting: Internal Medicine

## 2023-03-23 DIAGNOSIS — E063 Autoimmune thyroiditis: Secondary | ICD-10-CM | POA: Diagnosis not present

## 2023-03-23 DIAGNOSIS — Z881 Allergy status to other antibiotic agents status: Secondary | ICD-10-CM | POA: Diagnosis not present

## 2023-03-23 DIAGNOSIS — Z87891 Personal history of nicotine dependence: Secondary | ICD-10-CM | POA: Diagnosis not present

## 2023-03-23 DIAGNOSIS — E041 Nontoxic single thyroid nodule: Secondary | ICD-10-CM | POA: Diagnosis not present

## 2023-03-23 DIAGNOSIS — E065 Other chronic thyroiditis: Secondary | ICD-10-CM | POA: Diagnosis not present

## 2023-03-23 DIAGNOSIS — Z888 Allergy status to other drugs, medicaments and biological substances status: Secondary | ICD-10-CM | POA: Diagnosis not present

## 2023-03-23 DIAGNOSIS — Z79899 Other long term (current) drug therapy: Secondary | ICD-10-CM | POA: Diagnosis not present

## 2023-03-23 DIAGNOSIS — I10 Essential (primary) hypertension: Secondary | ICD-10-CM | POA: Diagnosis not present

## 2023-04-11 ENCOUNTER — Ambulatory Visit: Payer: Medicare PPO | Admitting: Obstetrics

## 2023-04-15 DIAGNOSIS — E890 Postprocedural hypothyroidism: Secondary | ICD-10-CM | POA: Diagnosis not present

## 2023-04-15 DIAGNOSIS — E042 Nontoxic multinodular goiter: Secondary | ICD-10-CM | POA: Diagnosis not present

## 2023-04-18 ENCOUNTER — Encounter: Payer: Self-pay | Admitting: Internal Medicine

## 2023-04-18 DIAGNOSIS — E039 Hypothyroidism, unspecified: Secondary | ICD-10-CM

## 2023-04-19 DIAGNOSIS — E039 Hypothyroidism, unspecified: Secondary | ICD-10-CM | POA: Insufficient documentation

## 2023-05-03 DIAGNOSIS — B351 Tinea unguium: Secondary | ICD-10-CM | POA: Diagnosis not present

## 2023-05-03 DIAGNOSIS — L718 Other rosacea: Secondary | ICD-10-CM | POA: Diagnosis not present

## 2023-05-03 DIAGNOSIS — L820 Inflamed seborrheic keratosis: Secondary | ICD-10-CM | POA: Diagnosis not present

## 2023-05-03 DIAGNOSIS — L821 Other seborrheic keratosis: Secondary | ICD-10-CM | POA: Diagnosis not present

## 2023-05-03 DIAGNOSIS — L84 Corns and callosities: Secondary | ICD-10-CM | POA: Diagnosis not present

## 2023-05-03 DIAGNOSIS — L723 Sebaceous cyst: Secondary | ICD-10-CM | POA: Diagnosis not present

## 2023-05-04 ENCOUNTER — Ambulatory Visit: Payer: Medicare PPO | Admitting: Obstetrics

## 2023-05-09 ENCOUNTER — Other Ambulatory Visit (INDEPENDENT_AMBULATORY_CARE_PROVIDER_SITE_OTHER)

## 2023-05-09 DIAGNOSIS — E039 Hypothyroidism, unspecified: Secondary | ICD-10-CM | POA: Diagnosis not present

## 2023-05-09 DIAGNOSIS — N3090 Cystitis, unspecified without hematuria: Secondary | ICD-10-CM

## 2023-05-09 LAB — BASIC METABOLIC PANEL WITH GFR
BUN: 12 mg/dL (ref 6–23)
CO2: 27 meq/L (ref 19–32)
Calcium: 9.2 mg/dL (ref 8.4–10.5)
Chloride: 103 meq/L (ref 96–112)
Creatinine, Ser: 0.72 mg/dL (ref 0.40–1.20)
GFR: 81.74 mL/min (ref >60.00)
Glucose, Bld: 104 mg/dL — ABNORMAL HIGH (ref 70–99)
Potassium: 3.7 meq/L (ref 3.5–5.1)
Sodium: 141 meq/L (ref 135–145)

## 2023-05-09 LAB — T4, FREE: Free T4: 0.58 ng/dL — ABNORMAL LOW (ref 0.60–1.60)

## 2023-05-09 LAB — T3, FREE: T3, Free: 3.4 pg/mL (ref 2.3–4.2)

## 2023-05-09 LAB — TSH: TSH: 8.98 u[IU]/mL — ABNORMAL HIGH (ref 0.35–5.50)

## 2023-05-10 ENCOUNTER — Encounter: Payer: Self-pay | Admitting: Internal Medicine

## 2023-05-11 ENCOUNTER — Ambulatory Visit: Admitting: Internal Medicine

## 2023-05-11 ENCOUNTER — Encounter: Payer: Self-pay | Admitting: Internal Medicine

## 2023-05-11 VITALS — BP 118/76 | HR 65 | Temp 98.6°F | Ht 59.0 in | Wt 166.2 lb

## 2023-05-11 DIAGNOSIS — E042 Nontoxic multinodular goiter: Secondary | ICD-10-CM

## 2023-05-11 DIAGNOSIS — E890 Postprocedural hypothyroidism: Secondary | ICD-10-CM

## 2023-05-11 MED ORDER — LEVOTHYROXINE SODIUM 25 MCG PO TABS: 25.0000 ug | ORAL_TABLET | Freq: Every day | ORAL | 3 refills | Status: DC

## 2023-05-11 NOTE — Assessment & Plan Note (Addendum)
 New Multinodular goiter S/p left hemithyroidectomy 03/23/23 Blood work 6 weeks after surgery done recently and TSH is elevated, free T4 is suppressed Start levothyroxine 25 mcg daily Recheck TFTs in 6 weeks Advised how to take thyroid  medication

## 2023-05-11 NOTE — Assessment & Plan Note (Signed)
 Chronic S/p left hemithyroidectomy Will do follow-up ultrasounds annually for right-sided thyroid  nodules-due November 2025

## 2023-05-11 NOTE — Progress Notes (Signed)
 Subjective:    Patient ID: Kimberly Stephenson, female    DOB: August 14, 1945, 78 y.o.   MRN: [id number]      HPI Cyd is here for  Chief Complaint  Patient presents with   Medical Management of Chronic Issues    S/p left thyroidectomy 03/23/23 - was not placed on medication.   She does feel very fatigued.  She has several other symptoms that she has had for years that do correspond with hypothyroidism.  Recent blood work showed elevated TSH and suppressed free T4     Medications and allergies reviewed with patient and updated if appropriate.  Current Outpatient Medications on File Prior to Visit  Medication Sig Dispense Refill   aspirin -acetaminophen-caffeine (EXCEDRIN MIGRAINE) 250-250-65 MG tablet Take 1 tablet by mouth daily. Reported on 04/08/2015     Cholecalciferol (VITAMIN D3) 25 MCG (1000 UT) CAPS Take 1 capsule (1,000 Units total) by mouth daily. 60 capsule    Clindamycin-Benzoyl Per, Refr, gel Apply topically 2 (two) times daily.     diphenhydrAMINE HCl (BENADRYL ALLERGY PO) Take by mouth.     estradiol  (ESTRACE ) 0.1 MG/GM vaginal cream Place 0.5 g vaginally 2 (two) times a week. Place 0.5g nightly for two weeks then twice a week after 42 g 3   hydrochlorothiazide  (HYDRODIURIL ) 12.5 MG tablet TAKE 1 TABLET(12.5 MG) BY MOUTH DAILY 90 tablet 3   ketoconazole (NIZORAL) 2 % cream APPLY TOPICALLY TO THE AFFECTED AREA TWICE DAILY     levocetirizine (XYZAL ) 5 MG tablet TAKE 1 TABLET(5 MG) BY MOUTH EVERY EVENING 30 tablet 5   metoprolol  succinate (TOPROL -XL) 100 MG 24 hr tablet TAKE 1 AND 1/2 TABLETS(150 MG) BY MOUTH TWICE DAILY WITH OR IMMEDIATELY FOLLOWING A MEAL 270 tablet 3   Multiple Vitamins-Calcium (ONE-A-DAY WOMENS PO) Take 1 each by mouth daily.       ZYLET 0.5-0.3 % SUSP Apply 1 drop to eye 4 (four) times daily.     No current facility-administered medications on file prior to visit.    Review of Systems  Constitutional:  Positive for fatigue.  Gastrointestinal:   Positive for constipation (occ).  Endocrine: Positive for cold intolerance.  Musculoskeletal:  Positive for arthralgias (diffuse).  Skin:        Dry skin, brittle nails, hair thinner  Psychiatric/Behavioral:  Negative for decreased concentration.        Objective:   Vitals:   05/11/23 1400  BP: 118/76  Pulse: 65  Temp: 98.6 F (37 C)  SpO2: 97%   BP Readings from Last 3 Encounters:  05/11/23 118/76  11/24/22 132/78  11/18/22 130/74   Wt Readings from Last 3 Encounters:  05/11/23 166 lb 3.2 oz (75.4 kg)  11/24/22 167 lb 3.2 oz (75.8 kg)  11/18/22 167 lb (75.8 kg)   Body mass index is 33.57 kg/m.    Physical Exam Constitutional:      General: She is not in acute distress.    Appearance: Normal appearance.  HENT:     Head: Normocephalic and atraumatic.  Eyes:     Conjunctiva/sclera: Conjunctivae normal.  Cardiovascular:     Rate and Rhythm: Normal rate and regular rhythm.     Heart sounds: Normal heart sounds.  Pulmonary:     Effort: Pulmonary effort is normal. No respiratory distress.     Breath sounds: Normal breath sounds. No wheezing.  Musculoskeletal:     Cervical back: Neck supple.     Right lower leg: No edema.  Left lower leg: No edema.  Lymphadenopathy:     Cervical: No cervical adenopathy.  Skin:    General: Skin is warm and dry.     Findings: No rash.  Neurological:     Mental Status: She is alert. Mental status is at baseline.  Psychiatric:        Mood and Affect: Mood normal.        Behavior: Behavior normal.            Assessment & Plan:    See Problem List for Assessment and Plan of chronic medical problems.

## 2023-05-11 NOTE — Patient Instructions (Addendum)
    Blood work was ordered.  Have this done in 6 weeks.    Medications changes include :   Start Levothyroxine 25 mcg daily  . Take your thyroid  medication with water first thing in the morning on an empty stomach, and avoid eating for at least 30 minutes.  Take any vitamins and supplements containing calcium, iron, or magnesium at least four hours apart from your thyroid  medication

## 2023-05-18 ENCOUNTER — Ambulatory Visit: Admitting: Obstetrics

## 2023-05-18 ENCOUNTER — Encounter: Payer: Self-pay | Admitting: Obstetrics

## 2023-05-18 ENCOUNTER — Ambulatory Visit: Payer: Self-pay

## 2023-05-18 VITALS — BP 158/70 | HR 63

## 2023-05-18 DIAGNOSIS — N952 Postmenopausal atrophic vaginitis: Secondary | ICD-10-CM | POA: Diagnosis not present

## 2023-05-18 DIAGNOSIS — R350 Frequency of micturition: Secondary | ICD-10-CM

## 2023-05-18 NOTE — Assessment & Plan Note (Addendum)
-   For symptomatic vaginal atrophy options include lubrication with a water-based lubricant, personal hygiene measures and barrier protection against wetness, and estrogen replacement in the form of vaginal cream, vaginal tablets, or a time-released vaginal ring.   - continue vaginal estrogen due to symptomatic relief, can increase to 0.5-1g 3 times a week as needed for vaginal symptoms - encouraged barrier ointment as needed for comfort

## 2023-05-18 NOTE — Assessment & Plan Note (Addendum)
-   reduced symptoms with vaginal estrogen use - We previously discussed the symptoms of overactive bladder (OAB), which include urinary urgency, urinary frequency, nocturia, with or without urge incontinence.  While we do not know the exact etiology of OAB, several treatment options exist. We discussed management including behavioral therapy (decreasing bladder irritants, urge suppression strategies, timed voids, bladder retraining), physical therapy, medication; for refractory cases posterior tibial nerve stimulation, sacral neuromodulation, and intravesical botulinum toxin injection.  - continue bladder training  - avoid Kegels due to increased pelvic floor muscle tone

## 2023-05-18 NOTE — Progress Notes (Signed)
 [facility] Return Visit  SUBJECTIVE  History of Present Illness: Kimberly Stephenson is a 78 y.o. female seen in follow-up for urinary frequency and vaginal atrophy. Plan at last visit was trial of vaginal estrogen.   Day time voids reduced to 4-5x/day down from 6-10x/day Started vaginal estrogen 5 weeks ago, initially with cramping and irritation noted on day 11 of use. Improved on day 14 and since using 2x/week, desires to increase to 3x/week due to return of irritation around 4 days after use.  Denies vaginal bleeding or irritation.   Underwent thyroid  surgery at [facility] 03/2023 without complications.   Past Medical History: Patient  has a past medical history of Amaurosis fugax, Colitis, ischemic (HCC) (2010), Irritable bowel syndrome, Milk intolerance, Nonspecific elevation of levels of transaminase or lactic acid dehydrogenase (LDH), Other and unspecified hyperlipidemia, Other and unspecified ovarian cyst, Polydipsia, Rosacea, Sebaceous cyst, and Unspecified essential hypertension.   Past Surgical History: She  has a past surgical history that includes Cesarean section; Tonsillectomy and adenoidectomy; Peri ovarian cyst right resected (1980); Appendectomy (1980); Colonoscopy (2003, 2008); and Upper gastrointestinal endoscopy (1986).   Medications: She has a current medication list which includes the following prescription(s): aspirin -acetaminophen-caffeine, vitamin d3, clindamycin-benzoyl per (refr), diphenhydramine hcl, estradiol , hydrochlorothiazide , ketoconazole, levocetirizine, levothyroxine , metoprolol  succinate, multiple vitamins-minerals, and zylet.   Allergies: Patient is allergic to cephalosporins, lamisil [terbinafine], sulfonamide derivatives, simvastatin, ezetimibe, and ramipril .   Social History: Patient  reports that she quit smoking about 54 years ago. Her smoking use included cigarettes. She has never used smokeless tobacco. She reports that she does not drink  alcohol and does not use drugs.     OBJECTIVE     Physical Exam: Vitals:   05/18/23 1439 05/18/23 1510  BP: (!) 168/80 (!) 158/70  Pulse: 69 63   Gen: No apparent distress, A&O x 3.  Detailed Urogynecologic Evaluation:  Deferred.       ASSESSMENT AND PLAN    Ms. Mote is a 78 y.o. with:  1. Vaginal atrophy   2. Urinary frequency     Vaginal atrophy Assessment & Plan: - For symptomatic vaginal atrophy options include lubrication with a water-based lubricant, personal hygiene measures and barrier protection against wetness, and estrogen replacement in the form of vaginal cream, vaginal tablets, or a time-released vaginal ring.   - continue vaginal estrogen due to symptomatic relief, can increase to 0.5-1g 3 times a week as needed for vaginal symptoms - encouraged barrier ointment as needed for comfort   Urinary frequency Assessment & Plan: - reduced symptoms with vaginal estrogen use - We previously discussed the symptoms of overactive bladder (OAB), which include urinary urgency, urinary frequency, nocturia, with or without urge incontinence.  While we do not know the exact etiology of OAB, several treatment options exist. We discussed management including behavioral therapy (decreasing bladder irritants, urge suppression strategies, timed voids, bladder retraining), physical therapy, medication; for refractory cases posterior tibial nerve stimulation, sacral neuromodulation, and intravesical botulinum toxin injection.  - continue bladder training  - avoid Kegels due to increased pelvic floor muscle tone   Return to office if change in vaginal or urinary symptoms.   Darlene Ehlers, MD

## 2023-05-18 NOTE — Patient Instructions (Addendum)
 Continue vaginal estrogen use, can increase to 0.5g three times a week  Please return to the office for reassessment if you experience any change in urinary or vaginal symptoms.

## 2023-05-31 ENCOUNTER — Other Ambulatory Visit (HOSPITAL_COMMUNITY)
Admission: RE | Admit: 2023-05-31 | Discharge: 2023-05-31 | Disposition: A | Discharge status: Home / Self Care | Source: Other Acute Inpatient Hospital | Attending: Obstetrics | Admitting: Obstetrics

## 2023-05-31 ENCOUNTER — Ambulatory Visit (INDEPENDENT_AMBULATORY_CARE_PROVIDER_SITE_OTHER)

## 2023-05-31 ENCOUNTER — Ambulatory Visit: Payer: Self-pay | Admitting: Obstetrics

## 2023-05-31 VITALS — BP 160/75 | HR 54

## 2023-05-31 DIAGNOSIS — R319 Hematuria, unspecified: Secondary | ICD-10-CM

## 2023-05-31 DIAGNOSIS — R300 Dysuria: Secondary | ICD-10-CM

## 2023-05-31 LAB — URINALYSIS, ROUTINE W REFLEX MICROSCOPIC
Bacteria, UA: NONE SEEN
Bilirubin Urine: NEGATIVE
Glucose, UA: NEGATIVE mg/dL
Ketones, ur: NEGATIVE mg/dL
Leukocytes,Ua: NEGATIVE
Nitrite: NEGATIVE
Protein, ur: NEGATIVE mg/dL
Specific Gravity, Urine: 1.01 (ref 1.005–1.030)
pH: 6 (ref 5.0–8.0)

## 2023-05-31 LAB — POCT URINALYSIS DIP (CLINITEK)
Bilirubin, UA: NEGATIVE
Glucose, UA: NEGATIVE mg/dL
Ketones, POC UA: NEGATIVE mg/dL
Leukocytes, UA: NEGATIVE
Nitrite, UA: NEGATIVE
POC PROTEIN,UA: NEGATIVE
Spec Grav, UA: 1.01 (ref 1.010–1.025)
Urobilinogen, UA: 0.2 U/dL
pH, UA: 6 (ref 5.0–8.0)

## 2023-05-31 NOTE — Progress Notes (Signed)
 Kimberly Stephenson arrived today with dysuria, has the sensation of a tampon stuck in her vagina. Patient is not experiencing fever, unstable vitals and/or one-sided back flank pain.  Patient has not had a recent hospitalization due to UTI.  Last visit in the office was 05/18/2023.  She is questioning the use of the vaginal estrogen. She is using it every other day and every Sunday. Is this a possible side effect?  Per protocol:   The most recent Urinalysis completed on 10-22-2022 and wasnormal.  Last Creatinine level  Lab Results  Component Value Date   CREATININE 0.72 05/09/2023    An urine specimen was collected and POCT urinalysis completed. [] A cath specimen was collected due to patient's current condition, symptoms or post-procedural state.  Total urine output by catheter is  Output by Drain (mL) 05/29/23 0701 - 05/29/23 1900 05/29/23 1901 - 05/30/23 0700 05/30/23 0701 - 05/30/23 1900 05/30/23 1901 - 05/31/23 0700 05/31/23 0701 - 05/31/23 1455  Patient has no LDAs of requested type attached.    Aaron Aas    POCT Urine results is not normal.  Urine micro was sent per protocol for abnormal urinalysis.  Urine culture was sent per protocol for abnormal urinalysis.     [] Pt was notified of positive urine results and plan for additional urine testing. We will contact you within the next 3-4 days with these results.  [] No Prescription was sent to your pharmacy.  The additional testing will indicate if a prescription is needed.   [] Patient was notified of abnormal urine results. The following prescription is sent to your preferred pharmacy.  []  Macrobid  100mg  #10 1 tablet by mouth twice daily with food for 5 days      []  Bactrim DS 800-160mg  #6 1 tablet by mouth twice daily for 3 days        []  Due to your current medication allergies, an alternate prescription was discussed with your provider and will be prescribed and sent to your pharmacy.  [] You can take over the counter AZO two tablets up to three times  a day for two days.  Take AZO tablets with a full glass of water. AZO will turn your urine orange, this is normal.   [x] The patient was notified of negative urine results.  If symptoms persist, you may take over the counter AZO two tablets up to three times a day for two days.  AZO will turn your urine orange, this is normal.  Contact the office back to schedule an appointment if your symptoms persist or worsen or you develop additional symptoms.         CC'd note to patient's provider.

## 2023-05-31 NOTE — Patient Instructions (Signed)
 It was a pleasure to see you today!  We have sent your labs for culture. We will await those results.  You can use over the counter AZO to see if this helps with the discomfort.  Thank you for trusting me with your care!

## 2023-06-01 MED ORDER — PHENAZOPYRIDINE HCL 200 MG PO TABS
200.0000 mg | ORAL_TABLET | Freq: Three times a day (TID) | ORAL | 0 refills | Status: DC | PRN
Start: 2023-06-01 — End: 2023-08-01

## 2023-06-01 MED ORDER — NITROFURANTOIN MONOHYD MACRO 100 MG PO CAPS: 100.0000 mg | ORAL_CAPSULE | Freq: Two times a day (BID) | ORAL | 0 refills | Status: AC

## 2023-06-02 LAB — URINE CULTURE: Culture: 40000 — AB

## 2023-06-13 ENCOUNTER — Ambulatory Visit (INDEPENDENT_AMBULATORY_CARE_PROVIDER_SITE_OTHER)

## 2023-06-13 VITALS — BP 166/78 | HR 59 | Temp 98.1°F

## 2023-06-13 DIAGNOSIS — R300 Dysuria: Secondary | ICD-10-CM | POA: Diagnosis not present

## 2023-06-13 DIAGNOSIS — R319 Hematuria, unspecified: Secondary | ICD-10-CM

## 2023-06-13 LAB — POCT URINALYSIS DIP (CLINITEK)
Bilirubin, UA: NEGATIVE
Glucose, UA: NEGATIVE mg/dL
Ketones, POC UA: NEGATIVE mg/dL
Leukocytes, UA: NEGATIVE
Nitrite, UA: NEGATIVE
POC PROTEIN,UA: NEGATIVE
Spec Grav, UA: 1.01 (ref 1.010–1.025)
Urobilinogen, UA: 0.2 U/dL
pH, UA: 6 (ref 5.0–8.0)

## 2023-06-13 NOTE — Addendum Note (Signed)
 Addended by: Graciela Lava on: 06/13/2023 02:49 PM   Modules accepted: Orders

## 2023-06-13 NOTE — Patient Instructions (Signed)
 Please keep all scheduled follow ups.  It was a pleasure to see you today!  Thank you for trusting me with your care!

## 2023-06-13 NOTE — Progress Notes (Signed)
 Kimberly Stephenson arrived today with dysuria. Patient is notexperiencing fever, unstable vitals and/or one-sided back flank pain. Patient has not had a recent hospitalization due to UTI.  Last visit in the office was 05/31/2023.  Per protocol:   The most recent Urinalysis completed on 05-31-2023 and wasnormal.  Last Creatinine level  Lab Results  Component Value Date   CREATININE 0.72 05/09/2023    An urine specimen was collected and POCT urinalysis completed. [] A cath specimen was collected due to patient's current condition, symptoms or post-procedural state.  Total urine output by catheter is  Output by Drain (mL) 06/11/23 0701 - 06/11/23 1900 06/11/23 1901 - 06/12/23 0700 06/12/23 0701 - 06/12/23 1900 06/12/23 1901 - 06/13/23 0700 06/13/23 0701 - 06/13/23 1442  Patient has no LDAs of requested type attached.    Aaron Aas    POCT Urine results is not normal.  Urine micro was sent per protocol for abnormal urinalysis.  Urine culture was sent per protocol for abnormal urinalysis.     [x] Pt was notified of positive urine results and plan for additional urine testing. We will contact you within the next 3-4 days with these results.  [x] No Prescription was sent to your pharmacy.  The additional testing will indicate if a prescription is needed.   [] Patient was notified of abnormal urine results. The following prescription is sent to your preferred pharmacy.  []  Macrobid  100mg  #10 1 tablet by mouth twice daily with food for 5 days      []  Bactrim DS 800-160mg  #6 1 tablet by mouth twice daily for 3 days        []  Due to your current medication allergies, an alternate prescription was discussed with your provider and will be prescribed and sent to your pharmacy.  [] You can take over the counter AZO two tablets up to three times a day for two days.  Take AZO tablets with a full glass of water. AZO will turn your urine orange, this is normal.   [] The patient was notified of negative urine results.  If symptoms  persist, you may take over the counter AZO two tablets up to three times a day for two days.  AZO will turn your urine orange, this is normal.  Contact the office back to schedule an appointment if your symptoms persist or worsen or you develop additional symptoms.       CC'd note to patient's provider.

## 2023-06-20 ENCOUNTER — Other Ambulatory Visit (INDEPENDENT_AMBULATORY_CARE_PROVIDER_SITE_OTHER)

## 2023-06-20 DIAGNOSIS — E890 Postprocedural hypothyroidism: Secondary | ICD-10-CM

## 2023-06-21 ENCOUNTER — Encounter: Payer: Self-pay | Admitting: Obstetrics

## 2023-06-21 LAB — T4, FREE: Free T4: 0.72 ng/dL (ref 0.60–1.60)

## 2023-06-21 LAB — TSH: TSH: 4.07 u[IU]/mL (ref 0.35–5.50)

## 2023-06-21 LAB — T3, FREE: T3, Free: 2.6 pg/mL (ref 2.3–4.2)

## 2023-06-22 ENCOUNTER — Ambulatory Visit: Payer: Self-pay | Admitting: Internal Medicine

## 2023-06-22 ENCOUNTER — Other Ambulatory Visit (HOSPITAL_COMMUNITY)
Admission: RE | Admit: 2023-06-22 | Discharge: 2023-06-22 | Disposition: A | Discharge status: Home / Self Care | Source: Other Acute Inpatient Hospital | Attending: Obstetrics and Gynecology | Admitting: Obstetrics and Gynecology

## 2023-06-22 ENCOUNTER — Ambulatory Visit (INDEPENDENT_AMBULATORY_CARE_PROVIDER_SITE_OTHER)

## 2023-06-22 VITALS — BP 122/64 | HR 64

## 2023-06-22 DIAGNOSIS — R82998 Other abnormal findings in urine: Secondary | ICD-10-CM | POA: Diagnosis not present

## 2023-06-22 DIAGNOSIS — R319 Hematuria, unspecified: Secondary | ICD-10-CM

## 2023-06-22 DIAGNOSIS — N390 Urinary tract infection, site not specified: Secondary | ICD-10-CM

## 2023-06-22 DIAGNOSIS — R350 Frequency of micturition: Secondary | ICD-10-CM

## 2023-06-22 DIAGNOSIS — E890 Postprocedural hypothyroidism: Secondary | ICD-10-CM

## 2023-06-22 LAB — POCT URINALYSIS DIP (CLINITEK)
Bilirubin, UA: NEGATIVE
Glucose, UA: NEGATIVE mg/dL
Ketones, POC UA: NEGATIVE mg/dL
Leukocytes, UA: NEGATIVE
Nitrite, UA: NEGATIVE
POC PROTEIN,UA: NEGATIVE
Spec Grav, UA: 1.015 (ref 1.010–1.025)
Urobilinogen, UA: 0.2 U/dL
pH, UA: 7 (ref 5.0–8.0)

## 2023-06-22 NOTE — Progress Notes (Signed)
 Kimberly Stephenson is a 78 y.o. female  arrived today with UTI sx.  A urine specimen was collected and POCT Urine was done. POCT Urine was positive for blood trace Urine was sent for culture and Microscopic

## 2023-06-22 NOTE — Telephone Encounter (Signed)
 Patient as an appt today to leave another urine sample.

## 2023-06-22 NOTE — Patient Instructions (Signed)
 Your Urine dip that was done in office was positive. I am sending the urine off for culture and you can take AZO over the counter for your discomfort.  We will contact you when the results are back between 3-5 days.  If a antibiotic is needed we will sent the order to the pharmacy and you will be notified. If you have any questions or concerns please feel free to call us  at [phone]

## 2023-06-23 LAB — URINE CULTURE: Culture: NO GROWTH

## 2023-06-23 NOTE — Addendum Note (Signed)
 Addended by: Linas Stepter N on: 06/23/2023 03:30 PM   Modules accepted: Orders

## 2023-06-24 ENCOUNTER — Encounter: Payer: Self-pay | Admitting: Obstetrics

## 2023-07-01 ENCOUNTER — Encounter: Payer: Self-pay | Admitting: Obstetrics

## 2023-07-01 ENCOUNTER — Ambulatory Visit: Admitting: Obstetrics

## 2023-07-01 DIAGNOSIS — N952 Postmenopausal atrophic vaginitis: Secondary | ICD-10-CM | POA: Diagnosis not present

## 2023-07-01 DIAGNOSIS — R319 Hematuria, unspecified: Secondary | ICD-10-CM | POA: Diagnosis not present

## 2023-07-01 NOTE — Assessment & Plan Note (Addendum)
-   denies UTI symptoms - For management of gross hematuria, we discussed the importance of work-up including assessing the upper and lower GU tract with CT urogram and cystoscopy.  - last creatinine 0.72 on 05/09/23 - She will pursue this work-up and follow-up afterward to discuss the results and decide on a treatment plan based on the findings.  - pending cystoscopy 09/29/23, will move up appt pending waitlist - CT hematuria workup ordered, pt to schedule - 2 pack year history

## 2023-07-01 NOTE — Patient Instructions (Addendum)
 Continue vaginal estrogen 0.5g three times a week.   We will schedule you for the earliest cystoscopy.   Please call if you experience any change in urinary symptoms.  Please call radiology at [phone] to schedule your imaging study today

## 2023-07-01 NOTE — Progress Notes (Signed)
  Urogynecology Return Visit  SUBJECTIVE  History of Present Illness: Kimberly Stephenson is a 78 y.o. female seen in follow-up for urinary frequency and vaginal atrophy. Plan at last visit was continue vaginal estrogen.   Reports pink tinged urine. Denies fever, chills, one sided back pain. 06/22/23 UA + heme, negative urine culture  - 2 pack year tobacco use in college - denies pelvic radiation, history of chemotherapy, family history of kidney cancer - reports possible exposure to chemicals at work due to [employer] features [employer]   Day time voids reduced to 4-5x/day down from 6-10x/day Using 0.5g vaginal estrogen 3x/week  Denies vaginal bleeding or irritation.  Underwent thyroid  surgery at [facility] 03/2023 without complications.   Past Medical History: Patient  has a past medical history of Amaurosis fugax, Colitis, ischemic (HCC) (2010), Irritable bowel syndrome, Milk intolerance, Nonspecific elevation of levels of transaminase or lactic acid dehydrogenase (LDH), Other and unspecified hyperlipidemia, Other and unspecified ovarian cyst, Polydipsia, Rosacea, Sebaceous cyst, and Unspecified essential hypertension.   Past Surgical History: She  has a past surgical history that includes Cesarean section; Tonsillectomy and adenoidectomy; Peri ovarian cyst right resected (1980); Appendectomy (1980); Colonoscopy (2003, 2008); and Upper gastrointestinal endoscopy (1986).   Medications: She has a current medication list which includes the following prescription(s): aspirin -acetaminophen-caffeine, vitamin d3, diphenhydramine hcl, estradiol , hydrochlorothiazide , ketoconazole, levocetirizine, levothyroxine , metoprolol  succinate, multiple vitamins-minerals, phenazopyridine , and zylet.   Allergies: Patient is allergic to cephalosporins, lamisil [terbinafine], sulfonamide derivatives, simvastatin, ezetimibe, and ramipril .   Social History: Patient  reports that she quit smoking about 54 years ago.  Her smoking use included cigarettes. She has never used smokeless tobacco. She reports that she does not drink alcohol and does not use drugs.     OBJECTIVE     Physical Exam: There were no vitals filed for this visit.  Gen: No apparent distress, A&O x 3.  Detailed Urogynecologic Evaluation:  Deferred.   Lab Results  Component Value Date   CREATININE 0.72 05/09/2023         ASSESSMENT AND PLAN    Kimberly Stephenson is a 70 y.o. with:  1. Hematuria, unspecified type   2. Vaginal atrophy      Hematuria, unspecified type Assessment & Plan: - denies UTI symptoms - For management of gross hematuria, we discussed the importance of work-up including assessing the upper and lower GU tract with CT urogram and cystoscopy.  - last creatinine 0.72 on 05/09/23 - She will pursue this work-up and follow-up afterward to discuss the results and decide on a treatment plan based on the findings.  - pending cystoscopy 09/29/23, will move up appt pending waitlist - CT hematuria workup ordered, pt to schedule - 2 pack year history  Orders: -     CT HEMATURIA WORKUP; Future  Vaginal atrophy Assessment & Plan: - For symptomatic vaginal atrophy options include lubrication with a water-based lubricant, personal hygiene measures and barrier protection against wetness, and estrogen replacement in the form of vaginal cream, vaginal tablets, or a time-released vaginal ring.   - continue vaginal estrogen due to symptomatic relief, can increase to 0.5 3 times a week - encouraged barrier ointment as needed for comfort   Time spent: I spent 13 minutes dedicated to the care of this patient on the date of this encounter to include pre-visit review of records, face-to-face time with the patient discussing hematuria, vaginal atrophy, and post visit documentation and ordering medication/ testing.    Kimberly ONEIDA Gillis, MD

## 2023-07-01 NOTE — Assessment & Plan Note (Signed)
-   For symptomatic vaginal atrophy options include lubrication with a water-based lubricant, personal hygiene measures and barrier protection against wetness, and estrogen replacement in the form of vaginal cream, vaginal tablets, or a time-released vaginal ring.   - continue vaginal estrogen due to symptomatic relief, can increase to 0.5 3 times a week - encouraged barrier ointment as needed for comfort

## 2023-07-12 ENCOUNTER — Encounter: Payer: Self-pay | Admitting: Obstetrics

## 2023-07-12 ENCOUNTER — Ambulatory Visit: Admitting: Obstetrics

## 2023-07-12 VITALS — BP 149/69 | HR 60

## 2023-07-12 DIAGNOSIS — R310 Gross hematuria: Secondary | ICD-10-CM | POA: Diagnosis not present

## 2023-07-12 DIAGNOSIS — R319 Hematuria, unspecified: Secondary | ICD-10-CM

## 2023-07-12 LAB — POCT URINALYSIS DIP (CLINITEK)
Bilirubin, UA: NEGATIVE
Glucose, UA: NEGATIVE mg/dL
Ketones, POC UA: NEGATIVE mg/dL
Leukocytes, UA: NEGATIVE
Nitrite, UA: NEGATIVE
POC PROTEIN,UA: NEGATIVE
Spec Grav, UA: 1.015 (ref 1.010–1.025)
Urobilinogen, UA: 0.2 U/dL
pH, UA: 6.5 (ref 5.0–8.0)

## 2023-07-12 NOTE — Patient Instructions (Signed)
 Taking Care of Yourself after Urodynamics, Cystoscopy, Bulkamid Injection, or Botox Injection   Drink plenty of water for a day or two following your procedure. Try to have about 8 ounces (one cup) at a time, and do this 6 times or more per day unless you have fluid restrictitons AVOID irritative beverages such as coffee, tea, soda, alcoholic or citrus drinks for a day or two, as this may cause burning with urination.  For the first 1-2 days after the procedure, your urine may be pink or red in color. You may have some blood in your urine as a normal side effect of the procedure. Large amounts of bleeding or difficulty urinating are NOT normal. Call the nurse line if this happens or go to the nearest Emergency Room if the bleeding is heavy or you cannot urinate at all and it is after hours. If you had a Bulkamid injection in the urethra and need to be catheterized, ask for a pediatric catheter to be used (size 10 or 12-French) so the material is not pushed out of place.   You may experience some discomfort or a burning sensation with urination after having this procedure. You can use over the counter Azo or pyridium  to help with burning and follow the instructions on the packaging. If it does not improve within 1-2 days, or other symptoms appear (fever, chills, or difficulty urinating) call the office to speak to a nurse.  You may return to normal daily activities such as work, school, driving, exercising and housework on the day of the procedure. If your doctor gave you a prescription, take it as ordered.    Continue vaginal estrogen 0.5g 2-3 times a week.   Please call if you experience any change in urinary symptoms.   Proceed with your CT urogram study to rule out upper urinary tract causes of blood in urine.

## 2023-07-12 NOTE — Progress Notes (Addendum)
 CYSTOSCOPY  CC:  This is a 78 y.o. with gross hematuria who presents today for cystoscopy.  Pending CT urogram tomorrow  Results for orders placed or performed in visit on 07/12/23  POCT URINALYSIS DIP (CLINITEK)   Collection Time: 07/12/23 11:44 AM  Result Value Ref Range   Color, UA yellow yellow   Clarity, UA clear clear   Glucose, UA negative negative mg/dL   Bilirubin, UA negative negative   Ketones, POC UA negative negative mg/dL   Spec Grav, UA 8.984 8.989 - 1.025   Blood, UA trace-intact (A) negative   pH, UA 6.5 5.0 - 8.0   POC PROTEIN,UA negative negative, trace   Urobilinogen, UA 0.2 0.2 or 1.0 E.U./dL   Nitrite, UA Negative Negative   Leukocytes, UA Negative Negative     BP (!) 149/69   Pulse 60   CYSTOSCOPY: A time out was performed.  The periurethral area was prepped and draped in a sterile manner.  2% lidocaine jetpack was inserted at the urethral meatus. The urethra and bladder were visualized with flexible cystoscope.  She had normal urethral coaptation and normal urethral mucosa.  She had normal bladder mucosa. She had bilateral clear efflux from both ureteral orifices.  She had no squamous metaplasia at the trigone, no trabeculations, cellules or diverticuli.     ASSESSMENT:  78 y.o. with gross hematuria. Cystoscopy today is normal.  PLAN:  - continue vaginal estrogen use 0.5-1g, 2-3x/week - return if change in urinary symptoms - pending CT urogram tomorrow. - Follow-up to discuss findings and treatment options.  All questions answered and post-procedures instructions were given  Lianne ONEIDA Gillis, MD

## 2023-07-13 ENCOUNTER — Ambulatory Visit (HOSPITAL_COMMUNITY)
Admission: RE | Admit: 2023-07-13 | Discharge: 2023-07-13 | Disposition: A | Discharge status: Home / Self Care | Source: Ambulatory Visit | Attending: Obstetrics | Admitting: Obstetrics

## 2023-07-13 DIAGNOSIS — D259 Leiomyoma of uterus, unspecified: Secondary | ICD-10-CM | POA: Diagnosis not present

## 2023-07-13 DIAGNOSIS — K429 Umbilical hernia without obstruction or gangrene: Secondary | ICD-10-CM | POA: Diagnosis not present

## 2023-07-13 DIAGNOSIS — R319 Hematuria, unspecified: Secondary | ICD-10-CM | POA: Insufficient documentation

## 2023-07-13 DIAGNOSIS — R310 Gross hematuria: Secondary | ICD-10-CM | POA: Diagnosis not present

## 2023-07-13 DIAGNOSIS — R932 Abnormal findings on diagnostic imaging of liver and biliary tract: Secondary | ICD-10-CM | POA: Diagnosis not present

## 2023-07-13 MED ORDER — IOHEXOL 300 MG/ML  SOLN
100.0000 mL | Freq: Once | INTRAMUSCULAR | Status: AC | PRN
  Administered 2023-07-13: 100 mL via INTRAVENOUS

## 2023-07-19 ENCOUNTER — Ambulatory Visit: Payer: Self-pay | Admitting: Obstetrics

## 2023-08-01 ENCOUNTER — Other Ambulatory Visit (HOSPITAL_COMMUNITY)
Admission: RE | Admit: 2023-08-01 | Discharge: 2023-08-01 | Disposition: A | Discharge status: Home / Self Care | Source: Other Acute Inpatient Hospital | Attending: Obstetrics | Admitting: Obstetrics

## 2023-08-01 ENCOUNTER — Ambulatory Visit: Payer: Self-pay | Admitting: Obstetrics

## 2023-08-01 ENCOUNTER — Ambulatory Visit (INDEPENDENT_AMBULATORY_CARE_PROVIDER_SITE_OTHER)

## 2023-08-01 VITALS — BP 161/63 | HR 56 | Temp 98.6°F

## 2023-08-01 DIAGNOSIS — R82998 Other abnormal findings in urine: Secondary | ICD-10-CM

## 2023-08-01 DIAGNOSIS — R319 Hematuria, unspecified: Secondary | ICD-10-CM

## 2023-08-01 DIAGNOSIS — R300 Dysuria: Secondary | ICD-10-CM

## 2023-08-01 LAB — POCT URINALYSIS DIP (CLINITEK)
Bilirubin, UA: NEGATIVE
Glucose, UA: NEGATIVE mg/dL
Ketones, POC UA: NEGATIVE mg/dL
Nitrite, UA: NEGATIVE
POC PROTEIN,UA: NEGATIVE
Spec Grav, UA: 1.015 (ref 1.010–1.025)
Urobilinogen, UA: 0.2 U/dL
pH, UA: 7 (ref 5.0–8.0)

## 2023-08-01 LAB — URINALYSIS, COMPLETE (UACMP) WITH MICROSCOPIC
Bilirubin Urine: NEGATIVE
Glucose, UA: NEGATIVE mg/dL
Ketones, ur: NEGATIVE mg/dL
Nitrite: NEGATIVE
Protein, ur: NEGATIVE mg/dL
RBC / HPF: >50 RBC/hpf (ref 0–5)
Specific Gravity, Urine: 1.01 (ref 1.005–1.030)
WBC, UA: >50 WBC/hpf (ref 0–5)
pH: 7 (ref 5.0–8.0)

## 2023-08-01 MED ORDER — NITROFURANTOIN MONOHYD MACRO 100 MG PO CAPS: 100.0000 mg | ORAL_CAPSULE | Freq: Two times a day (BID) | ORAL | 0 refills | Status: DC

## 2023-08-01 MED ORDER — PHENAZOPYRIDINE HCL 200 MG PO TABS: 200.0000 mg | ORAL_TABLET | Freq: Three times a day (TID) | ORAL | 0 refills | Status: DC | PRN

## 2023-08-01 NOTE — Addendum Note (Signed)
 Addended by: KRYSTAL ANDREE GAILS on: 08/01/2023 02:29 PM   Modules accepted: Orders

## 2023-08-01 NOTE — Progress Notes (Signed)
 Kimberly Stephenson arrived today with cloudy urine and dysuria. Patient is not experiencing fever, unstable vitals and/or one-sided back flank pain. Patient has not had had a recent hospitalization due to UTI.  Last visit in the office was 07/12/2023.  Per protocol:   The most recent Urinalysis completed on 06-22-2023 and was notnormal.  Last Creatinine level  Lab Results  Component Value Date   CREATININE 0.72 05/09/2023    An urine specimen was collected and POCT urinalysis completed.  [] A cath specimen was collected due to patient's current condition, symptoms or post-procedural state.  Total urine output by catheter is  Output by Drain (mL) 07/30/23 0701 - 07/30/23 1900 07/30/23 1901 - 07/31/23 0700 07/31/23 0701 - 07/31/23 1900 07/31/23 1901 - 08/01/23 0700 08/01/23 0701 - 08/01/23 1314  Patient has no LDAs of requested type attached.    SABRA    POCT Urine results is not normal.  Urine micro was sent per protocol for abnormal urinalysis.  Urine culture sent per protocol for abnormal urinalysis.     [x] Pt was notified of positive urine results and plan for additional urine testing. We will contact you within the next 3-4 days with these results.  [] No Prescription was sent to your pharmacy.  The additional testing will indicate if a prescription is needed.   [] Patient was notified of abnormal urine results. The following prescription is sent to your preferred pharmacy.  []  Macrobid  100mg  #10 1 tablet by mouth twice daily with food for 5 days      [x]  Bactrim DS 800-160mg  #6 1 tablet by mouth twice daily for 3 days        []  Due to your current medication allergies, an alternate prescription was discussed with your provider and will be prescribed and sent to your pharmacy.  [] You can take over the counter AZO two tablets up to three times a day for two days.  Take AZO tablets with a full glass of water. AZO will turn your urine orange, this is normal.   [] The patient was notified of negative urine  results.  If symptoms persist, you may take over the counter AZO two tablets up to three times a day for two days.  AZO will turn your urine orange, this is normal.  Contact the office back to schedule an appointment if your symptoms persist or worsen or you develop additional symptoms.       CC'd note to patient's provider.

## 2023-08-01 NOTE — Patient Instructions (Signed)
 We have sent in Macrobid  and pyridium  to the pharmacy. Your urine did show an infection. We have also sent this out for a culture.  Please keep all scheduled appointments.   It was a pleasure to see you today!  Thank you for trusting me with your care!

## 2023-08-03 LAB — URINE CULTURE: Culture: 30000 — AB

## 2023-08-16 ENCOUNTER — Ambulatory Visit (INDEPENDENT_AMBULATORY_CARE_PROVIDER_SITE_OTHER)

## 2023-08-16 VITALS — BP 115/74 | HR 74

## 2023-08-16 DIAGNOSIS — R350 Frequency of micturition: Secondary | ICD-10-CM

## 2023-08-16 LAB — POCT URINALYSIS DIP (CLINITEK)
Bilirubin, UA: NEGATIVE
Blood, UA: NEGATIVE
Glucose, UA: NEGATIVE mg/dL
Ketones, POC UA: NEGATIVE mg/dL
Leukocytes, UA: NEGATIVE
Nitrite, UA: NEGATIVE
POC PROTEIN,UA: NEGATIVE
Spec Grav, UA: 1.02 (ref 1.010–1.025)
Urobilinogen, UA: 0.2 U/dL
pH, UA: 7 (ref 5.0–8.0)

## 2023-08-16 NOTE — Progress Notes (Signed)
 Kimberly Stephenson is a 78 y.o. female  arrived today with UTI sx.  A urine specimen was collected and POCT Urine was done. POCT Urine was Negative

## 2023-08-16 NOTE — Patient Instructions (Signed)
 Please keep all future appointments and if you have any questions or concerns please feel free to contact our office at [phone].

## 2023-09-29 ENCOUNTER — Other Ambulatory Visit: Admitting: Obstetrics

## 2023-10-12 ENCOUNTER — Ambulatory Visit: Admitting: Obstetrics

## 2023-10-12 ENCOUNTER — Other Ambulatory Visit (HOSPITAL_COMMUNITY)
Admission: RE | Admit: 2023-10-12 | Discharge: 2023-10-12 | Disposition: A | Discharge status: Home / Self Care | Source: Ambulatory Visit | Attending: Obstetrics | Admitting: Obstetrics

## 2023-10-12 ENCOUNTER — Encounter: Payer: Self-pay | Admitting: Obstetrics

## 2023-10-12 VITALS — BP 134/65 | HR 55

## 2023-10-12 DIAGNOSIS — R319 Hematuria, unspecified: Secondary | ICD-10-CM | POA: Diagnosis not present

## 2023-10-12 DIAGNOSIS — N898 Other specified noninflammatory disorders of vagina: Secondary | ICD-10-CM

## 2023-10-12 DIAGNOSIS — N952 Postmenopausal atrophic vaginitis: Secondary | ICD-10-CM | POA: Diagnosis not present

## 2023-10-12 DIAGNOSIS — N950 Postmenopausal bleeding: Secondary | ICD-10-CM

## 2023-10-12 NOTE — Patient Instructions (Addendum)
 Continue vaginal estrogen 1g three times a week.   Please call radiology at [phone] to schedule your imaging study today  - discussed proper vulvar care, warm compression, avoid pad use, cotton only underwear and barrier ointment if needed  - encouraged Vit E suppository, moisturizer with Replens/Revaree  We will contact you with your vaginal testing results.

## 2023-10-12 NOTE — Assessment & Plan Note (Addendum)
-   denies UTI symptoms, however continues to report blood tinged underwear at the end of the day - For management of gross hematuria, we discussed the importance of work-up including assessing the upper and lower GU tract with CT urogram and cystoscopy.  - last creatinine 0.72 on 05/09/23 - cystoscopy negative 07/12/23 - CT hematuria 07/13/23 Subcentimeter left kidney interpolar cortical lesion, too small to characterize, possibly a small cyst. Reviewed findings with patient, through joint decision making, declines additional workup at this time.  - 2 pack year history - discussed need to repeat workup if clinical change - discussed need for endometrial assessment due to persistent bleeding to r/o GYN sources. Offered endometrial biopsy vs. TVUS. Discussed other possible sources such as vaginitis.  - pending Nuswab, self collected per pt request - declines EMB, ordered TVUS. Will refer to GYN to establish care if additional workup needed

## 2023-10-12 NOTE — Assessment & Plan Note (Signed)
-   For symptomatic vaginal atrophy options include lubrication with a water-based lubricant, personal hygiene measures and barrier protection against wetness, and estrogen replacement in the form of vaginal cream, vaginal tablets, or a time-released vaginal ring.   - continue vaginal estrogen 1g 2-3x/week due to symptomatic relief - encouraged barrier ointment as needed for comfort

## 2023-10-12 NOTE — Progress Notes (Signed)
 [facility] Return Visit  SUBJECTIVE  History of Present Illness: Kimberly Stephenson is a 78 y.o. female seen in follow-up for urinary frequency and vaginal atrophy. Plan at last visit was continue vaginal estrogen and proceed with CT urogram.   Cystoscopy negative 07/12/23, CT urogram  Using vaginal estrogen 1g 2-3x/week Reports pink tinged urine at the end of the day when she wipes. Denies fever, chills, one sided back pain. Denies blood in the toilet.  06/22/23 UA + heme, negative urine culture  - 2 pack year tobacco use in college - denies pelvic radiation, history of chemotherapy, family history of kidney cancer - reports possible exposure to chemicals at work due to [employer] features [employer]  Previously called due to elevated BP when she has UTI symptoms.  08/16/23 urine negative for heme or infection.  Takes a shower every 1-2 days instead of 3-4 days.   Day time voids reduced to 4-5x/day down from 6-10x/day Using 0.5g vaginal estrogen 3x/week  Denies vaginal bleeding or irritation.  Underwent thyroid  surgery at [facility] 03/2023 without complications.   CT urogram 07/13/23 CLINICAL DATA:  Gross hematuria   EXAM: CT ABDOMEN AND PELVIS WITHOUT AND WITH CONTRAST   TECHNIQUE: Multidetector CT imaging of the abdomen and pelvis was performed following the standard protocol before and following the bolus administration of intravenous contrast.   RADIATION DOSE REDUCTION: This exam was performed according to the departmental dose-optimization program which includes automated exposure control, adjustment of the mA and/or kV according to patient size and/or use of iterative reconstruction technique.   CONTRAST:  OMNIPAQUE  IOHEXOL  300 MG/ML  SOLN   COMPARISON:  None Available.   FINDINGS: Lower chest: No acute abnormality.   Hepatobiliary: Subcentimeter hypodensities in hepatic dome and left lobe, too small to characterize likely representing small cysts. No gallstones,  gallbladder wall thickening, or biliary dilatation.   Pancreas: Unremarkable. No pancreatic ductal dilatation or surrounding inflammatory changes.   Spleen: Normal in size without focal abnormality. Small fat containing   Adrenals/Urinary Tract: Adrenal glands are unremarkable.   Hypodense left kidney cortical lesion measuring 5 mm in interpolar region of the medial cortex (9/58), too small to characterize. Otherwise kidneys are normal, without renal calculi, or hydronephrosis. There is an intravesical air bubble within the bladder dome measuring 1.7 cm (prior instrumentation?). No bladder stone or mass lesion identified.   Stomach/Bowel: Stomach is within normal limits. No bowel obstruction. No evidence of bowel wall thickening, distention, or inflammatory changes.   Vascular/Lymphatic: Aortic atherosclerosis. No enlarged abdominal or pelvic lymph nodes.   Reproductive: Subcentimeter calcified uterine fibroid. No adnexal mass.   Other: Umbilical hernia with a defect measuring 1 cm.   Musculoskeletal: Degenerative changes of the spine and bilateral hip joints.   IMPRESSION: Intravesical air which may correlate to recent instrumentation. Correlate with clinical findings. No nephrolithiasis or hydronephrosis. Recommend ultrasound for further assessment.   Subcentimeter left kidney interpolar cortical lesion, too small to characterize, possibly a small cyst. This finding does not require imaging follow-up according to ACR guidelines however in the presence of persistent hematuria recommend follow-up if clinically warranted.   Uterine fibroid.   Query hepatic cysts.     Electronically Signed   By: Megan  Zare M.D.   On: 07/14/2023 18:02  Past Medical History: Patient  has a past medical history of Amaurosis fugax, Colitis, ischemic (2010), Irritable bowel syndrome, Milk intolerance, Nonspecific elevation of levels of transaminase or lactic acid dehydrogenase (LDH),  Other and unspecified hyperlipidemia, Other and  unspecified ovarian cyst, Polydipsia, Rosacea, Sebaceous cyst, and Unspecified essential hypertension.   Past Surgical History: She  has a past surgical history that includes Cesarean section; Tonsillectomy and adenoidectomy; Peri ovarian cyst right resected (1980); Appendectomy (1980); Colonoscopy (2003, 2008); and Upper gastrointestinal endoscopy (1986).   Medications: She has a current medication list which includes the following prescription(s): aspirin -acetaminophen-caffeine, vitamin d3, diphenhydramine hcl, estradiol , hydrochlorothiazide , ketoconazole, levocetirizine, levothyroxine , metoprolol  succinate, multiple vitamins-minerals, nitrofurantoin  (macrocrystal-monohydrate), phenazopyridine , and zylet.   Allergies: Patient is allergic to cephalosporins, lamisil [terbinafine], sulfonamide derivatives, simvastatin, ezetimibe, and ramipril .   Social History: Patient  reports that she quit smoking about 54 years ago. Her smoking use included cigarettes. She has never used smokeless tobacco. She reports that she does not drink alcohol and does not use drugs.     OBJECTIVE     Physical Exam: Vitals:   10/12/23 1122  BP: 134/65  Pulse: (!) 55    Gen: No apparent distress, A&O x 3.  Detailed Urogynecologic Evaluation:  Deferred.   Lab Results  Component Value Date   CREATININE 0.72 05/09/2023         ASSESSMENT AND PLAN    Kimberly Stephenson is a 61 y.o. with:  1. Vaginal discharge   2. Postmenopausal bleeding   3. Hematuria, unspecified type   4. Vaginal atrophy       Vaginal discharge -     US  PELVIS TRANSVAGINAL NON-OB (TV ONLY); Future -     Cervicovaginal ancillary only  Postmenopausal bleeding -     US  PELVIS TRANSVAGINAL NON-OB (TV ONLY); Future  Hematuria, unspecified type Assessment & Plan: - denies UTI symptoms, however continues to report blood tinged underwear at the end of the day - For management of gross  hematuria, we discussed the importance of work-up including assessing the upper and lower GU tract with CT urogram and cystoscopy.  - last creatinine 0.72 on 05/09/23 - cystoscopy negative 07/12/23 - CT hematuria 07/13/23 Subcentimeter left kidney interpolar cortical lesion, too small to characterize, possibly a small cyst. Reviewed findings with patient, through joint decision making, declines additional workup at this time.  - 2 pack year history - discussed need to repeat workup if clinical change - discussed need for endometrial assessment due to persistent bleeding to r/o GYN sources. Offered endometrial biopsy vs. TVUS. Discussed other possible sources such as vaginitis.  - pending Nuswab, self collected per pt request - declines EMB, ordered TVUS. Will refer to GYN to establish care if additional workup needed   Vaginal atrophy Assessment & Plan: - For symptomatic vaginal atrophy options include lubrication with a water-based lubricant, personal hygiene measures and barrier protection against wetness, and estrogen replacement in the form of vaginal cream, vaginal tablets, or a time-released vaginal ring.   - continue vaginal estrogen 1g 2-3x/week due to symptomatic relief - encouraged barrier ointment as needed for comfort    Time spent: I spent 37 minutes dedicated to the care of this patient on the date of this encounter to include pre-visit review of records, face-to-face time with the patient discussing hematuria, vaginal atrophy, and post visit documentation and ordering medication/ testing.    Lianne ONEIDA Gillis, MD

## 2023-10-13 ENCOUNTER — Other Ambulatory Visit: Admitting: Obstetrics

## 2023-10-13 ENCOUNTER — Ambulatory Visit: Payer: Self-pay | Admitting: Obstetrics

## 2023-10-13 DIAGNOSIS — N950 Postmenopausal bleeding: Secondary | ICD-10-CM

## 2023-10-13 LAB — CERVICOVAGINAL ANCILLARY ONLY
Bacterial Vaginitis (gardnerella): NEGATIVE
Candida Glabrata: NEGATIVE
Candida Vaginitis: NEGATIVE
Comment: NEGATIVE
Comment: NEGATIVE
Comment: NEGATIVE

## 2023-10-19 ENCOUNTER — Encounter: Payer: Self-pay | Admitting: Internal Medicine

## 2023-10-19 DIAGNOSIS — E042 Nontoxic multinodular goiter: Secondary | ICD-10-CM

## 2023-10-19 DIAGNOSIS — E890 Postprocedural hypothyroidism: Secondary | ICD-10-CM

## 2023-10-25 ENCOUNTER — Ambulatory Visit (HOSPITAL_COMMUNITY)
Admission: RE | Admit: 2023-10-25 | Discharge: 2023-10-25 | Disposition: A | Discharge status: Home / Self Care | Source: Ambulatory Visit | Attending: Obstetrics | Admitting: Obstetrics

## 2023-10-25 DIAGNOSIS — N950 Postmenopausal bleeding: Secondary | ICD-10-CM | POA: Insufficient documentation

## 2023-10-25 DIAGNOSIS — N898 Other specified noninflammatory disorders of vagina: Secondary | ICD-10-CM | POA: Insufficient documentation

## 2023-10-25 DIAGNOSIS — N854 Malposition of uterus: Secondary | ICD-10-CM | POA: Diagnosis not present

## 2023-10-26 ENCOUNTER — Inpatient Hospital Stay: Admission: RE | Admit: 2023-10-26 | Discharge: 2023-10-26 | Attending: Internal Medicine

## 2023-10-26 ENCOUNTER — Ambulatory Visit: Payer: Self-pay | Admitting: Internal Medicine

## 2023-10-26 ENCOUNTER — Ambulatory Visit: Admitting: Internal Medicine

## 2023-10-26 VITALS — BP 150/70 | HR 55 | Temp 98.7°F | Ht 59.0 in | Wt 166.0 lb

## 2023-10-26 DIAGNOSIS — E890 Postprocedural hypothyroidism: Secondary | ICD-10-CM

## 2023-10-26 DIAGNOSIS — E042 Nontoxic multinodular goiter: Secondary | ICD-10-CM | POA: Diagnosis not present

## 2023-10-26 DIAGNOSIS — M25572 Pain in left ankle and joints of left foot: Secondary | ICD-10-CM | POA: Diagnosis not present

## 2023-10-26 NOTE — Assessment & Plan Note (Signed)
 Acute pain and swelling are consistent with a sprain, possibly worsened by underlying osteoarthritis. Rest, ice, and elevate the foot. Use ibuprofen or acetaminophen for pain as needed. An elastic bandage may provide support. Gradually return to activity over one week. Epsom salt baths may offer comfort.

## 2023-10-26 NOTE — Telephone Encounter (Signed)
 FYI Only or Action Required?: FYI only for provider.  Patient was last seen in primary care on 05/11/2023 by Geofm Glade PARAS, MD.  Called Nurse Triage reporting Ankle Injury.  Symptoms began several days ago.  Interventions attempted: Other: Epson salt bath.  Symptoms are: gradually worsening.  Triage Disposition: See HCP Within 4 Hours (Or PCP Triage)  Patient/caregiver understands and will follow disposition?: Yes                Copied from CRM #[id number]. Topic: Clinical - Red Word Triage >> Oct 26, 2023  9:13 AM Rea BROCKS wrote: Red Word that prompted transfer to Nurse Triage: Injured ankle two days ago and swelling started last night, can hardly walk, tender on the ankle. Patient also hit big toe, has discolored toe nail. Patient can't remember how it happened, thinks she hit it on the door but didn't realize the severity of her injury. Doesn't know if needs to be seen by Er or be seen by Dr. Geofm Reason for Disposition  [1] SEVERE pain (e.g., excruciating) AND [2] not improved 2 hours after pain medicine/ice packs  Answer Assessment - Initial Assessment Questions This RN scheduled pt an appointment today in office.   Left ankle injury on Sunday Hit her left foot on a door jam Yesterday pt woke up with excruciating pain in her ankle Left big toe has a discolored toenail (like a bruise underneath the toenail)  Pt took an Epson salt bath yesterday and wrapped her foot in an ACE bandage yesterday  Last night swelling started in left ankle  WEIGHT-BEARING: Can you put weight on that foot? Can you walk (four steps or more)?       Very little  PAIN: Is there pain? If Yes, ask: How bad is the pain?  What does it keep you from doing? (Scale 0-10; or none, mild, moderate, severe)     10 /10 pain level when trying to put weight on it; 4/10 pain level when sitting still  Protocols used: Ankle Injury-A-AH

## 2023-10-26 NOTE — Progress Notes (Signed)
 Subjective:   Patient ID: Kimberly Stephenson, female    DOB: [birth year], 78 y.o.   MRN: [id number]  Discussed the use of AI scribe software for clinical note transcription with the patient, who gave verbal consent to proceed.  History of Present Illness Kimberly Stephenson is a 78 year old female who presents with ankle pain following a foot injury.  She injured her foot over the weekend, possibly on Saturday night or Sunday, by running into a door jam. Initially, the pain was mild, but by the following morning, she experienced excruciating pain in her ankle. She attempted to alleviate the pain by soaking her foot in an Epsom salts bath for five minutes and wrapping it in an ace bandage.  She noticed swelling and discoloration in the ankle, describing it as reddish. Her brother, a vascular surgeon, advised her to remove the ace bandage every five hours to maintain blood flow. However, by the evening, the swelling had returned. She can walk if she avoids putting her foot flat and instead puts pressure on her heel.  No pain in the midfoot, bottom of the foot, or heel, but pain radiates from the ankle up the leg. She recalls a previous ankle injury 35 years ago, which resulted in a sprain and a loud snap, but she did not seek medical attention at that time. In the past two years, she has experienced recurrent pain in the same area, which her brother attributes to arthritis.  She has been managing the current pain with Advil and Excedrin, which are effective. She also uses an ace bandage for support, which helps her walk better. She has been elevating her foot and finds comfort in Epsom salt baths.  Review of Systems  Constitutional: Negative.   HENT: Negative.    Eyes: Negative.   Respiratory:  Negative for cough, chest tightness and shortness of breath.   Cardiovascular:  Negative for chest pain, palpitations and leg swelling.  Gastrointestinal:  Negative for abdominal distention, abdominal pain,  constipation, diarrhea, nausea and vomiting.  Musculoskeletal:  Positive for myalgias.  Skin: Negative.   Neurological: Negative.   Psychiatric/Behavioral: Negative.      Objective:  Physical Exam Constitutional:      Appearance: She is well-developed.  HENT:     Head: Normocephalic and atraumatic.  Cardiovascular:     Rate and Rhythm: Normal rate and regular rhythm.  Pulmonary:     Effort: Pulmonary effort is normal. No respiratory distress.     Breath sounds: Normal breath sounds. No wheezing or rales.  Abdominal:     General: Bowel sounds are normal. There is no distension.     Palpations: Abdomen is soft.     Tenderness: There is no abdominal tenderness.  Musculoskeletal:        General: Tenderness present.     Cervical back: Normal range of motion.     Comments: Tenderness and swelling left ankle  Skin:    General: Skin is warm and dry.  Neurological:     Mental Status: She is alert and oriented to person, place, and time.     Coordination: Coordination normal.     Vitals:   10/26/23 1110  BP: (!) 150/70  Pulse: (!) 55  Temp: 98.7 F (37.1 C)  TempSrc: Oral  SpO2: 97%  Weight: 166 lb (75.3 kg)  Height: 4' 11 (1.499 m)    Assessment and Plan Assessment & Plan Left ankle and foot sprain with post-traumatic osteoarthritis   Acute pain  and swelling are consistent with a sprain, possibly worsened by underlying osteoarthritis. Rest, ice, and elevate the foot. Use ibuprofen or acetaminophen for pain as needed. An elastic bandage may provide support. Gradually return to activity over one week. Epsom salt baths may offer comfort.

## 2023-10-30 ENCOUNTER — Ambulatory Visit: Payer: Self-pay | Admitting: Internal Medicine

## 2023-10-30 DIAGNOSIS — E042 Nontoxic multinodular goiter: Secondary | ICD-10-CM

## 2023-11-05 ENCOUNTER — Other Ambulatory Visit: Payer: Self-pay | Admitting: Obstetrics

## 2023-11-16 ENCOUNTER — Ambulatory Visit (INDEPENDENT_AMBULATORY_CARE_PROVIDER_SITE_OTHER)

## 2023-11-16 VITALS — BP 128/80 | HR 67 | Ht 63.0 in | Wt 164.8 lb

## 2023-11-16 DIAGNOSIS — Z0000 Encounter for general adult medical examination without abnormal findings: Secondary | ICD-10-CM | POA: Diagnosis not present

## 2023-11-16 NOTE — Progress Notes (Signed)
 Chief Complaint  Patient presents with   Medicare Wellness     Subjective:   Kimberly Stephenson is a 78 y.o. female who presents for a Medicare Annual Wellness Visit.  Allergies (verified) Cephalosporins, Lamisil [terbinafine], Sulfonamide derivatives, Simvastatin, Ezetimibe, and Ramipril    History: Past Medical History:  Diagnosis Date   Amaurosis fugax    x 3 ; resolved with BP meds   Colitis, ischemic 2010   Dr. Luis   Irritable bowel syndrome    Dr Luis, GI   Milk intolerance    Nonspecific elevation of levels of transaminase or lactic acid dehydrogenase (LDH)    Other and unspecified hyperlipidemia    [place] study LDL goal=<130; NMR LDL  goal = < 160, ideally < 130   Other and unspecified ovarian cyst    PMHx   Polydipsia    Rosacea    Sebaceous cyst     left buttock   Unspecified essential hypertension    Past Surgical History:  Procedure Laterality Date   APPENDECTOMY  1980   CESAREAN SECTION     x 2; G2 P2   COLONOSCOPY  2003, 2008   due 2018; Dr Luis   Peri ovarian cyst right resected  1980   TONSILLECTOMY AND ADENOIDECTOMY     UPPER GASTROINTESTINAL ENDOSCOPY  1986   Family History  Problem Relation Age of Onset   Basal cell carcinoma Mother    Hypertension Mother 48       Angioplasty   Lung cancer Mother        Small cell   Heart attack Mother 39   Cancer Mother        lung and skin (basal cell carcinoma)   Diabetes Father    Dementia Father    Heart attack Father 38   Heart disease Father    Diabetes Sister    Obesity Sister        x 2   Thyroid  nodules Sister    Diabetes Maternal Grandfather    Diabetes Paternal Grandmother    Stroke Paternal Grandfather    Dementia Paternal Grandfather    Heart attack Maternal Aunt         X 2;ages 56  & 65   Lung cancer Maternal Aunt        smoker   Heart failure Maternal Aunt 62   Prostate cancer Other        nephew   Other Other        PTH nodule, nephew   Uterine cancer Neg Hx     Bladder Cancer Neg Hx    Social History   Occupational History   Occupation: Retired/ Runner, Broadcasting/film/video  Tobacco Use   Smoking status: Former    Current packs/day: 0.00    Types: Cigarettes    Quit date: 01/04/1969    Years since quitting: 54.9   Smokeless tobacco: Never   Tobacco comments:    smoked 1969- 1971, up to 2 ppd  Vaping Use   Vaping status: Never Used  Substance and Sexual Activity   Alcohol use: No    Comment:  very rarely, < 1 glass of wine/ month   Drug use: No   Sexual activity: Not Currently   Tobacco Counseling Counseling given: Not Answered Tobacco comments: smoked 1969- 1971, up to 2 ppd  SDOH Screenings   Food Insecurity: No Food Insecurity (11/16/2023)  Housing: Low Risk  (11/16/2023)  Transportation Needs: No Transportation Needs (11/16/2023)  Utilities: Not At Risk (11/16/2023)  Alcohol Screen: Low Risk  (10/26/2023)  Depression (PHQ2-9): Low Risk  (11/16/2023)  Financial Resource Strain: Low Risk  (10/26/2023)  Physical Activity: Sufficiently Active (11/16/2023)  Recent Concern: Physical Activity - Insufficiently Active (10/26/2023)  Social Connections: Moderately Integrated (11/16/2023)  Stress: No Stress Concern Present (11/16/2023)  Tobacco Use: Medium Risk (11/16/2023)  Health Literacy: Adequate Health Literacy (11/16/2023)   See flowsheets for full screening details  Depression Screen PHQ 2 & 9 Depression Scale- Over the past 2 weeks, how often have you been bothered by any of the following problems? Little interest or pleasure in doing things: 0 Feeling down, depressed, or hopeless (PHQ Adolescent also includes...irritable): 0 PHQ-2 Total Score: 0 Trouble falling or staying asleep, or sleeping too much: 1 (gets 5-8hrs of sleep) Feeling tired or having little energy: 0 Poor appetite or overeating (PHQ Adolescent also includes...weight loss): 0 Feeling bad about yourself - or that you are a failure or have let yourself or your family down:  0 Trouble concentrating on things, such as reading the newspaper or watching television (PHQ Adolescent also includes...like school work): 0 Moving or speaking so slowly that other people could have noticed. Or the opposite - being so fidgety or restless that you have been moving around a lot more than usual: 0 Thoughts that you would be better off dead, or of hurting yourself in some way: 0 PHQ-9 Total Score: 1 If you checked off any problems, how difficult have these problems made it for you to do your work, take care of things at home, or get along with other people?: Not difficult at all  Depression Treatment Depression Interventions/Treatment : EYV7-0 Score <4 Follow-up Not Indicated     Goals Addressed               This Visit's Progress     Patient Stated (pt-stated)        Patient stated she plans to continue managing her weight and bp readings       Visit info / Clinical Intake: Medicare Wellness Visit Type:: Subsequent Annual Wellness Visit Persons participating in visit:: patient Medicare Wellness Visit Mode:: In-person (required for WTM) Information given by:: patient Interpreter Needed?: No Pre-visit prep was completed: yes AWV questionnaire completed by patient prior to visit?: yes Date:: 11/12/23 Living arrangements:: (!) lives alone Patient's Overall Health Status Rating: good Typical amount of pain: none Does pain affect daily life?: no Are you currently prescribed opioids?: no  Dietary Habits and Nutritional Risks How many meals a day?: 3 Eats fruit and vegetables daily?: yes Most meals are obtained by: preparing own meals; eating out In the last 2 weeks, have you had any of the following?: none Diabetic:: no  Functional Status Activities of Daily Living (to include ambulation/medication): Independent Ambulation: Independent with device- listed below Home Assistive Devices/Equipment: Eyeglasses Medication Administration: Independent Home  Management: Independent Manage your own finances?: yes Primary transportation is: driving Concerns about vision?: no *vision screening is required for WTM* Concerns about hearing?: no  Fall Screening Falls in the past year?: 0 Number of falls in past year: 0 Was there an injury with Fall?: 0 Fall Risk Category Calculator: 0 Patient Fall Risk Level: Low Fall Risk  Fall Risk Patient at Risk for Falls Due to: No Fall Risks Fall risk Follow up: Falls evaluation completed; Falls prevention discussed  Home and Transportation Safety: All rugs have non-skid backing?: yes All stairs or steps have railings?: yes Grab bars in the bathtub or shower?: yes Have non-skid  surface in bathtub or shower?: yes Good home lighting?: yes Regular seat belt use?: yes Hospital stays in the last year:: no  Cognitive Assessment Difficulty concentrating, remembering, or making decisions? : no Will 6CIT or Mini Cog be Completed: yes What year is it?: 0 points What month is it?: 0 points Give patient an address phrase to remember (5 components): [address] [place], [place] About what time is it?: 0 points Count backwards from 20 to 1: 0 points Say the months of the year in reverse: 0 points Repeat the address phrase from earlier: 0 points 6 CIT Score: 0 points  Advance Directives (For Healthcare) Does Patient Have a Medical Advance Directive?: Yes Does patient want to make changes to medical advance directive?: Yes (Inpatient - patient requests chaplain consult to change a medical advance directive) Type of Advance Directive: Healthcare Power of [place]; Living will Copy of Healthcare Power of Attorney in Chart?: No - copy requested Copy of Living Will in Chart?: No - copy requested  Reviewed/Updated  Reviewed/Updated: Reviewed All (Medical, Surgical, Family, Medications, Allergies, Care Teams, Patient Goals)        Objective:    Today's Vitals   11/16/23 1557  BP: 128/80  Pulse: 67  SpO2:  97%  Weight: 164 lb 12.8 oz (74.8 kg)  Height: 5' 3 (1.6 m)   Body mass index is 29.19 kg/m.  Current Medications (verified) Outpatient Encounter Medications as of 11/16/2023  Medication Sig   aspirin -acetaminophen-caffeine (EXCEDRIN MIGRAINE) 250-250-65 MG tablet Take 1 tablet by mouth daily. Reported on 04/08/2015   Cholecalciferol (VITAMIN D3) 25 MCG (1000 UT) CAPS Take 1 capsule (1,000 Units total) by mouth daily.   diphenhydrAMINE HCl (BENADRYL ALLERGY PO) Take by mouth.   estradiol  (ESTRACE ) 0.01 % CREA vaginal cream PLACE 0.5.G VAGINALLY NIGHTLY FOR 2 WEEKS THEN TWICE A WEEK AFTER   hydrochlorothiazide  (HYDRODIURIL ) 12.5 MG tablet TAKE 1 TABLET(12.5 MG) BY MOUTH DAILY   ketoconazole (NIZORAL) 2 % cream APPLY TOPICALLY TO THE AFFECTED AREA TWICE DAILY   levocetirizine (XYZAL ) 5 MG tablet TAKE 1 TABLET(5 MG) BY MOUTH EVERY EVENING   levothyroxine  (SYNTHROID ) 25 MCG tablet Take 1 tablet (25 mcg total) by mouth daily.   metoprolol  succinate (TOPROL -XL) 100 MG 24 hr tablet TAKE 1 AND 1/2 TABLETS(150 MG) BY MOUTH TWICE DAILY WITH OR IMMEDIATELY FOLLOWING A MEAL   Multiple Vitamins-Calcium (ONE-A-DAY WOMENS PO) Take 1 each by mouth daily.     nitrofurantoin , macrocrystal-monohydrate, (MACROBID ) 100 MG capsule Take 1 capsule (100 mg total) by mouth 2 (two) times daily.   phenazopyridine  (PYRIDIUM ) 200 MG tablet Take 1 tablet (200 mg total) by mouth 3 (three) times daily as needed for pain.   ZYLET 0.5-0.3 % SUSP Apply 1 drop to eye 4 (four) times daily.   No facility-administered encounter medications on file as of 11/16/2023.   Hearing/Vision screen Hearing Screening - Comments:: Denies hearing difficulties   Vision Screening - Comments:: Wears rx glasses - up to date with routine eye exams with Charlie Glendia Gaudy  Immunizations and Health Maintenance Health Maintenance  Topic Date Due   COVID-19 Vaccine (6 - Pfizer risk 2025-26 season) 04/14/2024   Medicare Annual Wellness (AWV)   11/15/2024   DTaP/Tdap/Td (4 - Td or Tdap) 11/06/2032   Influenza Vaccine  Completed   Hepatitis C Screening  Completed   Meningococcal B Vaccine  Aged Out   Pneumococcal Vaccine: 50+ Years  Discontinued   Mammogram  Discontinued   DEXA SCAN  Discontinued  Colonoscopy  Discontinued   Fecal DNA (Cologuard)  Discontinued   Zoster Vaccines- Shingrix  Discontinued        Assessment/Plan:  This is a routine wellness examination for Kimberly Stephenson.  Patient Care Team: Geofm Glade PARAS, MD as PCP - General (Internal Medicine) Octavia, Charlie Hamilton, MD as Consulting Physician (Ophthalmology)  I have personally reviewed and noted the following in the patient's chart:   Medical and social history Use of alcohol, tobacco or illicit drugs  Current medications and supplements including opioid prescriptions. Functional ability and status Nutritional status Physical activity Advanced directives List of other physicians Hospitalizations, surgeries, and ER visits in previous 12 months Vitals Screenings to include cognitive, depression, and falls Referrals and appointments  No orders of the defined types were placed in this encounter.  In addition, I have reviewed and discussed with patient certain preventive protocols, quality metrics, and best practice recommendations. A written personalized care plan for preventive services as well as general preventive health recommendations were provided to patient.   Kimberly Stephenson CHRISTELLA Saba, CMA   11/16/2023   Return in 1 year (on 11/15/2024).  After Visit Summary: (In Person-Declined) Patient declined AVS at this time.  Nurse Notes: CPE w/PCP on 12/13/2023.  Scheduled 2026 AWV/CPE appts.

## 2023-11-16 NOTE — Patient Instructions (Addendum)
 [name],  Thank you for taking the time for your Medicare Wellness Visit. I appreciate your continued commitment to your health goals. Please review the care plan we discussed, and feel free to reach out if I can assist you further.  Please note that Annual Wellness Visits do not include a physical exam. Some assessments may be limited, especially if the visit was conducted virtually. If needed, we may recommend an in-person follow-up with your provider.  Ongoing Care Seeing your primary care provider every 3 to 6 months helps us  monitor your health and provide consistent, personalized care.   Referrals If a referral was made during today's visit and you haven't received any updates within two weeks, please contact the referred provider directly to check on the status.  Recommended Screenings:  Health Maintenance  Topic Date Due   COVID-19 Vaccine (6 - Pfizer risk 2025-26 season) 04/14/2024   Medicare Annual Wellness Visit  11/15/2024   DTaP/Tdap/Td vaccine (4 - Td or Tdap) 11/06/2032   Flu Shot  Completed   Hepatitis C Screening  Completed   Meningitis B Vaccine  Aged Out   Pneumococcal Vaccine for age over 23  Discontinued   Breast Cancer Screening  Discontinued   DEXA scan (bone density measurement)  Discontinued   Colon Cancer Screening  Discontinued   Cologuard (Stool DNA test)  Discontinued   Zoster (Shingles) Vaccine  Discontinued       11/16/2023    4:01 PM  Advanced Directives  Does Patient Have a Medical Advance Directive? Yes  Type of Estate Agent of [place];Living will  Does patient want to make changes to medical advance directive? Yes (Inpatient - patient requests chaplain consult to change a medical advance directive)  Copy of Healthcare Power of Attorney in Chart? No - copy requested    Vision: Annual vision screenings are recommended for early detection of glaucoma, cataracts, and diabetic retinopathy. These exams can also reveal  signs of chronic conditions such as diabetes and high blood pressure.  Dental: Annual dental screenings help detect early signs of oral cancer, gum disease, and other conditions linked to overall health, including heart disease and diabetes.

## 2023-11-17 ENCOUNTER — Telehealth: Payer: Self-pay

## 2023-11-17 NOTE — Telephone Encounter (Signed)
 Copied from CRM [id number]. Topic: Clinical - Request for Lab/Test Order >> Nov 17, 2023  1:24 PM Franky GRADE wrote: Reason for CRM: Patient is scheduled for her annual physical on 12/13/2023 and would like to have her labs done prior;however, no active orders.

## 2023-11-21 ENCOUNTER — Other Ambulatory Visit: Payer: Self-pay | Admitting: Internal Medicine

## 2023-11-21 ENCOUNTER — Other Ambulatory Visit: Payer: Self-pay

## 2023-11-21 DIAGNOSIS — Z1231 Encounter for screening mammogram for malignant neoplasm of breast: Secondary | ICD-10-CM

## 2023-11-21 DIAGNOSIS — I10 Essential (primary) hypertension: Secondary | ICD-10-CM

## 2023-11-21 DIAGNOSIS — R739 Hyperglycemia, unspecified: Secondary | ICD-10-CM

## 2023-11-21 DIAGNOSIS — E042 Nontoxic multinodular goiter: Secondary | ICD-10-CM

## 2023-11-21 DIAGNOSIS — E7849 Other hyperlipidemia: Secondary | ICD-10-CM

## 2023-11-21 DIAGNOSIS — E041 Nontoxic single thyroid nodule: Secondary | ICD-10-CM

## 2023-11-21 NOTE — Telephone Encounter (Signed)
 Labs ordered.

## 2023-11-22 ENCOUNTER — Other Ambulatory Visit

## 2023-11-22 DIAGNOSIS — R739 Hyperglycemia, unspecified: Secondary | ICD-10-CM

## 2023-11-22 DIAGNOSIS — E042 Nontoxic multinodular goiter: Secondary | ICD-10-CM | POA: Diagnosis not present

## 2023-11-22 DIAGNOSIS — I10 Essential (primary) hypertension: Secondary | ICD-10-CM | POA: Diagnosis not present

## 2023-11-22 DIAGNOSIS — E7849 Other hyperlipidemia: Secondary | ICD-10-CM | POA: Diagnosis not present

## 2023-11-22 DIAGNOSIS — E041 Nontoxic single thyroid nodule: Secondary | ICD-10-CM | POA: Diagnosis not present

## 2023-11-22 LAB — COMPREHENSIVE METABOLIC PANEL WITH GFR
ALT: 17 U/L (ref 0–35)
AST: 20 U/L (ref 0–37)
Albumin: 4 g/dL (ref 3.5–5.2)
Alkaline Phosphatase: 53 U/L (ref 39–117)
BUN: 14 mg/dL (ref 6–23)
CO2: 30 meq/L (ref 19–32)
Calcium: 9.2 mg/dL (ref 8.4–10.5)
Chloride: 102 meq/L (ref 96–112)
Creatinine, Ser: 0.76 mg/dL (ref 0.40–1.20)
GFR: 76.32 mL/min (ref >60.00)
Glucose, Bld: 98 mg/dL (ref 70–99)
Potassium: 4 meq/L (ref 3.5–5.1)
Sodium: 139 meq/L (ref 135–145)
Total Bilirubin: 0.7 mg/dL (ref 0.2–1.2)
Total Protein: 6.8 g/dL (ref 6.0–8.3)

## 2023-11-22 LAB — CBC WITH DIFFERENTIAL/PLATELET
Basophils Absolute: 0.1 K/uL (ref 0.0–0.1)
Basophils Relative: 1 % (ref 0.0–3.0)
Eosinophils Absolute: 0.3 K/uL (ref 0.0–0.7)
Eosinophils Relative: 4.7 % (ref 0.0–5.0)
HCT: 39.4 % (ref 36.0–46.0)
Hemoglobin: 13.8 g/dL (ref 12.0–15.0)
Lymphocytes Relative: 27.1 % (ref 12.0–46.0)
Lymphs Abs: 1.6 K/uL (ref 0.7–4.0)
MCHC: 35 g/dL (ref 30.0–36.0)
MCV: 90.9 fl (ref 78.0–100.0)
Monocytes Absolute: 0.6 K/uL (ref 0.1–1.0)
Monocytes Relative: 9.2 % (ref 3.0–12.0)
Neutro Abs: 3.5 K/uL (ref 1.4–7.7)
Neutrophils Relative %: 58 % (ref 43.0–77.0)
Platelets: 240 K/uL (ref 150.0–400.0)
RBC: 4.34 Mil/uL (ref 3.87–5.11)
RDW: 12.8 % (ref 11.5–15.5)
WBC: 6 K/uL (ref 4.0–10.5)

## 2023-11-22 LAB — LIPID PANEL
Cholesterol: 197 mg/dL (ref 0–200)
HDL: 57.7 mg/dL (ref >39.00)
LDL Cholesterol: 115 mg/dL — ABNORMAL HIGH (ref 0–99)
NonHDL: 139.26
Total CHOL/HDL Ratio: 3
Triglycerides: 119 mg/dL (ref 0.0–149.0)
VLDL: 23.8 mg/dL (ref 0.0–40.0)

## 2023-11-22 LAB — TSH: TSH: 7.63 u[IU]/mL — ABNORMAL HIGH (ref 0.35–5.50)

## 2023-11-22 LAB — HEMOGLOBIN A1C: Hgb A1c MFr Bld: 5.5 % (ref 4.6–6.5)

## 2023-11-23 ENCOUNTER — Ambulatory Visit: Payer: Self-pay | Admitting: Internal Medicine

## 2023-11-23 MED ORDER — LEVOTHYROXINE SODIUM 25 MCG PO TABS: 50.0000 ug | ORAL_TABLET | Freq: Every day | ORAL | Status: DC

## 2023-11-24 ENCOUNTER — Encounter: Payer: Self-pay | Admitting: Obstetrics

## 2023-11-25 ENCOUNTER — Ambulatory Visit
Admission: RE | Admit: 2023-11-25 | Discharge: 2023-11-25 | Disposition: A | Discharge status: Home / Self Care | Source: Ambulatory Visit | Attending: Internal Medicine | Admitting: Internal Medicine

## 2023-11-25 DIAGNOSIS — Z1231 Encounter for screening mammogram for malignant neoplasm of breast: Secondary | ICD-10-CM | POA: Diagnosis not present

## 2023-11-25 NOTE — Telephone Encounter (Signed)
 Called the reading room to inquire.  Spoke to [place] and he said that he would get it sent to radiologist ASAP. KD CMA

## 2023-11-28 ENCOUNTER — Encounter: Payer: Self-pay | Admitting: Obstetrics

## 2023-11-29 ENCOUNTER — Telehealth: Admitting: Obstetrics

## 2023-11-29 DIAGNOSIS — N950 Postmenopausal bleeding: Secondary | ICD-10-CM

## 2023-11-29 DIAGNOSIS — F419 Anxiety disorder, unspecified: Secondary | ICD-10-CM

## 2023-11-29 MED ORDER — LORAZEPAM 1 MG PO TABS: 1.0000 mg | ORAL_TABLET | Freq: Two times a day (BID) | ORAL | 0 refills | Status: AC | PRN

## 2023-11-29 NOTE — Assessment & Plan Note (Signed)
-   Rx Ativan  for use prior to procedure - recommended to arrange transportation to avoid driving while taking anxiolytic

## 2023-11-29 NOTE — Assessment & Plan Note (Signed)
-   negative hematuria workup with negative cystoscopy 07/12/23, CT urogram with left kidney interpolar cortical lesion, too small to characterize, possibly a small cyst - TVUS 10/29/23 with uterus 7.0 x 2.9 x 3.3 cm and fluid in the LUS with 3.9cm endometrial stripe.  - Discussed evaluation is indicated to rule out precancerous or cancerous lesion and options include pelvic ultrasound or endometrial biopsy.  Risks of endometrial biopsy include bleeding, pain/cramping, infection and rare risk of uterine perforation and if that occurs possible laparoscopy/surgery.   - offered transabdominal ultrasound due to limited evaluation on TVUS vs. endometrial biopsy. Patient and her daughter desires to proceed with EMB. Advised NSAIDs, Tylenol and present with a full bladder for discomfort. Offered peri-cervical injections and Rx Ativan  for pre-procedure. - reviewed images with Dr. Shoshana, ES 3.73mm with fluid in endometrial canal. Attributed suboptimal images due to pt discomfort and bowel gas, recommended abdominal ultrasound to assess uterine fundus.

## 2023-11-29 NOTE — Progress Notes (Unsigned)
  Urogynecology Return Visit  SUBJECTIVE  History of Present Illness: Kimberly Stephenson is a 78 y.o. female seen in follow-up for urinary frequency and vaginal atrophy. Plan at last visit was continue vaginal estrogen and proceed with CT urogram.   Patient location: [employer] Provider location: [facility] [place]  Patient consents to video call and understands the limitations of telehealth visit.  Presented for appointment with daughter  History of D&C and cryotherapy.  Denies discomfort during TVUS.  TVUS 10/29/23 EXAM: PELVIC ULTRASOUND   TECHNIQUE: Transvaginal pelvic duplex ultrasound using B-mode/gray scaled imaging without Doppler spectral analysis and color flow was obtained. The patient had difficulty tolerating the exam.   COMPARISON: None provided   CLINICAL HISTORY: postmenopausal bleeding.   FINDINGS:   ULTRASOUND FINDINGS:   UTERUS: Uterus is anteverted measuring 7.0 x 2.9 x 3.3 cm. Estimated volume is 34.7 ml. Fluid is present within the lower uterine segment. The upper portion of the uterus is poorly visualized.   ENDOMETRIAL STRIPE: Endometrium measures 3.9 cm.   RIGHT OVARY:. Right ovary was not visualized.   LEFT OVARY: Left ovary was not visualized.   FREE FLUID: No free fluid.   IMPRESSION: 1. Limited evaluation due to patient discomfort and poor visualization of the upper uterus and ovaries. 2. Fluid within the lower uterine segment could represent hemorrhage. Recommend transabdominal pelvic ultrasound or sonohysterogram for further evaluation.   Electronically signed by: Lonni Necessary MD 11/27/2023 03:52 PM EST RP Workstation: [id number]   Prior visit: Cystoscopy negative 07/12/23, CT urogram with left kidney interpolar cortical lesion, too small to characterize, possibly a small cyst Using vaginal estrogen 1g 2-3x/week Reports pink tinged urine at the end of the day when she  wipes. Denies fever, chills, one sided back pain. Denies blood in the toilet.  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  08/16/23 urine negative for heme or infection.  Takes a shower every 1-2 days instead of 3-4 days.   Day time voids reduced to 4-5x/day down from 6-10x/day Underwent thyroid  surgery at [facility] 03/2023 without complications.   CT urogram 07/13/23 CLINICAL DATA:  Gross hematuria   EXAM: CT ABDOMEN AND PELVIS WITHOUT AND WITH CONTRAST   TECHNIQUE: Multidetector CT imaging of the abdomen and pelvis was performed following the standard protocol before and following the bolus administration of intravenous contrast.   RADIATION DOSE REDUCTION: This exam was performed according to the departmental dose-optimization program which includes automated exposure control, adjustment of the mA and/or kV according to patient size and/or use of iterative reconstruction technique.   CONTRAST:  100mL OMNIPAQUE  IOHEXOL  300 MG/ML  SOLN   COMPARISON:  None Available.   FINDINGS: Lower chest: No acute abnormality.   Hepatobiliary: Subcentimeter hypodensities in hepatic dome and left lobe, too small to characterize likely representing small cysts. No gallstones, gallbladder wall thickening, or biliary dilatation.   Pancreas: Unremarkable. No pancreatic ductal dilatation or surrounding inflammatory changes.   Spleen: Normal in size without focal abnormality. Small fat containing   Adrenals/Urinary Tract: Adrenal glands are unremarkable.   Hypodense left kidney cortical lesion measuring 5 mm in interpolar region of the medial cortex (9/58), too small to characterize. Otherwise kidneys are normal, without renal calculi,  or hydronephrosis. There is an intravesical air bubble within the bladder dome measuring 1.7 cm (prior instrumentation?). No bladder stone or mass lesion identified.   Stomach/Bowel: Stomach is within normal limits. No bowel obstruction. No evidence of bowel wall thickening, distention, or inflammatory changes.   Vascular/Lymphatic: Aortic atherosclerosis. No enlarged abdominal or pelvic lymph nodes.   Reproductive: Subcentimeter calcified uterine fibroid. No adnexal mass.   Other: Umbilical hernia with a defect measuring 1 cm.   Musculoskeletal: Degenerative changes of the spine and bilateral hip joints.   IMPRESSION: Intravesical air which may correlate to recent instrumentation. Correlate with clinical findings. No nephrolithiasis or hydronephrosis. Recommend ultrasound for further assessment.   Subcentimeter left kidney interpolar cortical lesion, too small to characterize, possibly a small cyst. This finding does not require imaging follow-up according to ACR guidelines however in the presence of persistent hematuria recommend follow-up if clinically warranted.   Uterine fibroid.   Query hepatic cysts.     Electronically Signed   By: Megan  Zare M.D.   On: 07/14/2023 18:02  Past Medical History: Patient  has a past medical history of Amaurosis fugax, Colitis, ischemic (2010), Irritable bowel syndrome, Milk intolerance, Nonspecific elevation of levels of transaminase or lactic acid dehydrogenase (LDH), Other and unspecified hyperlipidemia, Other and unspecified ovarian cyst, Polydipsia, Rosacea, Sebaceous cyst, and Unspecified essential hypertension.   Past Surgical History: She  has a past surgical history that includes Cesarean section; Tonsillectomy and adenoidectomy; Peri ovarian cyst right resected (1980); Appendectomy (1980); Colonoscopy (2003, 2008); and Upper gastrointestinal endoscopy (1986).   Medications: She has a current medication list which includes  the following prescription(s): lorazepam , aspirin -acetaminophen-caffeine, vitamin d3, diphenhydramine hcl, estradiol , hydrochlorothiazide , ketoconazole, levocetirizine, levothyroxine , metoprolol  succinate, multiple vitamins-minerals, nitrofurantoin  (macrocrystal-monohydrate), phenazopyridine , and zylet.   Allergies: Patient is allergic to cephalosporins, lamisil [terbinafine], sulfonamide derivatives, simvastatin, ezetimibe, and ramipril .   Social History: Patient  reports that she quit smoking about 54 years ago. Her smoking use included cigarettes. She has never used smokeless tobacco. She reports that she does not drink alcohol and does not use drugs.     OBJECTIVE     Physical Exam: There were no vitals filed for this visit.  Gen: No apparent distress, A&O x 3.  Lab Results  Component Value Date   CREATININE 0.76 11/22/2023     ASSESSMENT AND PLAN    Ms. Donati is a 78 y.o. with:  1. Anxiety due to invasive procedure   2. Postmenopausal bleeding    Anxiety due to invasive procedure Assessment & Plan: - Rx Ativan  for use prior to procedure - recommended to arrange transportation to avoid driving while taking anxiolytic   Orders: -     LORazepam ; Take 1 tablet (1 mg total) by mouth 2 (two) times daily as needed for anxiety.  Dispense: 2 tablet; Refill: 0  Postmenopausal bleeding Assessment & Plan: - negative hematuria workup with negative cystoscopy 07/12/23, CT urogram with left kidney interpolar cortical lesion, too small to characterize, possibly a small cyst - TVUS 10/29/23 with uterus 7.0 x 2.9 x 3.3 cm and fluid in the LUS with 3.9cm endometrial stripe.  - Discussed evaluation is indicated to rule out precancerous or cancerous lesion and options include pelvic ultrasound or endometrial biopsy.  Risks of endometrial biopsy include bleeding, pain/cramping, infection and rare risk of uterine perforation and if that occurs possible laparoscopy/surgery.   - offered  transabdominal ultrasound due to limited evaluation on TVUS vs. endometrial biopsy. Patient and her daughter desires to proceed with  EMB. Advised NSAIDs, Tylenol and present with a full bladder for discomfort. Offered peri-cervical injections and Rx Ativan  for pre-procedure. - reviewed images with Dr. Shoshana, ES 3.60mm with fluid in endometrial canal. Attributed suboptimal images due to pt discomfort and bowel gas, recommended abdominal ultrasound to assess uterine fundus.     Time spent: I spent 24 minutes dedicated to the care of this patient on the date of this encounter to include pre-visit review of records, face-to-face time with the patient discussing hematuria, vaginal atrophy, and post visit documentation and ordering medication/ testing.     Lianne ONEIDA Gillis, MD

## 2023-12-06 ENCOUNTER — Encounter: Payer: Self-pay | Admitting: Internal Medicine

## 2023-12-06 NOTE — Progress Notes (Unsigned)
 Subjective:    Patient ID: Kimberly Stephenson, female    DOB: 1945-04-21, 78 y.o.   MRN: [id number]      HPI Kimberly Stephenson is here for a Physical exam and her chronic medical problems.   Had labs - increased thyroid  medication dose.  Dec 13th - transabdominal US .  Slightly thick endometrium and some fluid in the uterus.  ? Need to bx   Medications and allergies reviewed with patient and updated if appropriate.  Current Outpatient Medications on File Prior to Visit  Medication Sig Dispense Refill   aspirin -acetaminophen-caffeine (EXCEDRIN MIGRAINE) 250-250-65 MG tablet Take 1 tablet by mouth daily. Reported on 04/08/2015     Cholecalciferol (VITAMIN D3) 25 MCG (1000 UT) CAPS Take 1 capsule (1,000 Units total) by mouth daily. 60 capsule    diphenhydrAMINE HCl (BENADRYL ALLERGY PO) Take by mouth.     estradiol  (ESTRACE ) 0.01 % CREA vaginal cream PLACE 0.5.G VAGINALLY NIGHTLY FOR 2 WEEKS THEN TWICE A WEEK AFTER 42.5 g 2   hydrochlorothiazide  (HYDRODIURIL ) 12.5 MG tablet TAKE 1 TABLET(12.5 MG) BY MOUTH DAILY 90 tablet 3   ketoconazole (NIZORAL) 2 % cream APPLY TOPICALLY TO THE AFFECTED AREA TWICE DAILY     levocetirizine (XYZAL ) 5 MG tablet TAKE 1 TABLET(5 MG) BY MOUTH EVERY EVENING 30 tablet 5   levothyroxine  (SYNTHROID ) 25 MCG tablet Take 2 tablets (50 mcg total) by mouth daily.     LORazepam  (ATIVAN ) 1 MG tablet Take 1 tablet (1 mg total) by mouth 2 (two) times daily as needed for anxiety. 2 tablet 0   metoprolol  succinate (TOPROL -XL) 100 MG 24 hr tablet TAKE 1 AND 1/2 TABLETS(150 MG) BY MOUTH TWICE DAILY WITH OR IMMEDIATELY FOLLOWING A MEAL 270 tablet 3   Multiple Vitamins-Calcium (ONE-A-DAY WOMENS PO) Take 1 each by mouth daily.       ZYLET 0.5-0.3 % SUSP Apply 1 drop to eye 4 (four) times daily.     No current facility-administered medications on file prior to visit.    Review of Systems  Constitutional:  Negative for fever.  Eyes:  Negative for visual disturbance.  Respiratory:   Positive for cough (morning) and shortness of breath. Negative for wheezing.   Cardiovascular:  Negative for chest pain, palpitations and leg swelling.  Gastrointestinal:  Positive for diarrhea (chronic). Negative for abdominal pain, blood in stool and constipation.       No gerd  Genitourinary:  Negative for dysuria, frequency, hematuria and urgency.  Musculoskeletal:  Positive for arthralgias. Negative for back pain.  Skin:  Negative for rash.  Neurological:  Positive for headaches (occ migraines). Negative for light-headedness and numbness.  Psychiatric/Behavioral:  Negative for dysphoric mood and sleep disturbance. The patient is not nervous/anxious.        Objective:   Vitals:   12/07/23 1108  BP: 118/76  Pulse: 66  Temp: 98.4 F (36.9 C)  SpO2: 97%   Filed Weights   12/07/23 1108  Weight: 166 lb (75.3 kg)   Body mass index is 32.97 kg/m.  BP Readings from Last 3 Encounters:  12/07/23 118/76  11/16/23 128/80  10/26/23 (!) 150/70    Wt Readings from Last 3 Encounters:  12/07/23 166 lb (75.3 kg)  11/16/23 164 lb 12.8 oz (74.8 kg)  10/26/23 166 lb (75.3 kg)       Physical Exam Constitutional: She appears well-developed and well-nourished. No distress.  HENT:  Head: Normocephalic and atraumatic.  Right Ear: External ear normal. Normal ear canal and TM  Left Ear: External ear normal.  Normal ear canal and TM Mouth/Throat: Oropharynx is clear and moist.  Eyes: Conjunctivae normal.  Neck: Neck supple. No tracheal deviation present. No thyromegaly present.  No carotid bruit  Cardiovascular: Normal rate, regular rhythm and normal heart sounds.   No murmur heard.  No edema. Pulmonary/Chest: Effort normal and breath sounds normal. No respiratory distress. She has no wheezes. She has no rales.  Breast: deferred   Abdominal: Soft. She exhibits no distension. There is no tenderness.  Lymphadenopathy: She has no cervical adenopathy.  Skin: Skin is warm and dry. She is  not diaphoretic.  Psychiatric: She has a normal mood and affect. Her behavior is normal.     Lab Results  Component Value Date   WBC 6.0 11/22/2023   HGB 13.8 11/22/2023   HCT 39.4 11/22/2023   PLT 240.0 11/22/2023   GLUCOSE 98 11/22/2023   CHOL 197 11/22/2023   TRIG 119.0 11/22/2023   HDL 57.70 11/22/2023   LDLDIRECT 142.7 08/21/2012   LDLCALC 115 (H) 11/22/2023   ALT 17 11/22/2023   AST 20 11/22/2023   NA 139 11/22/2023   K 4.0 11/22/2023   CL 102 11/22/2023   CREATININE 0.76 11/22/2023   BUN 14 11/22/2023   CO2 30 11/22/2023   TSH 7.63 (H) 11/22/2023   HGBA1C 5.5 11/22/2023         Assessment & Plan:   Physical exam: Screening blood work  ordered Exercise  not regular  Weight  obese Substance abuse  none   Reviewed recommended immunizations.   Health Maintenance  Topic Date Due   COVID-19 Vaccine (6 - Pfizer risk 2025-26 season) 04/14/2024   Medicare Annual Wellness (AWV)  11/15/2024   DTaP/Tdap/Td (4 - Td or Tdap) 11/06/2032   Influenza Vaccine  Completed   Hepatitis C Screening  Completed   Meningococcal B Vaccine  Aged Out   Pneumococcal Vaccine: 50+ Years  Discontinued   Mammogram  Discontinued   Bone Density Scan  Discontinued   Colonoscopy  Discontinued   Fecal DNA (Cologuard)  Discontinued   Zoster Vaccines- Shingrix  Discontinued          See Problem List for Assessment and Plan of chronic medical problems.

## 2023-12-06 NOTE — Patient Instructions (Addendum)

## 2023-12-07 ENCOUNTER — Ambulatory Visit: Admitting: Internal Medicine

## 2023-12-07 VITALS — BP 118/76 | HR 66 | Temp 98.4°F | Ht 59.5 in | Wt 166.0 lb

## 2023-12-07 DIAGNOSIS — E890 Postprocedural hypothyroidism: Secondary | ICD-10-CM | POA: Diagnosis not present

## 2023-12-07 DIAGNOSIS — Z0000 Encounter for general adult medical examination without abnormal findings: Secondary | ICD-10-CM | POA: Diagnosis not present

## 2023-12-07 DIAGNOSIS — E7800 Pure hypercholesterolemia, unspecified: Secondary | ICD-10-CM | POA: Diagnosis not present

## 2023-12-07 DIAGNOSIS — R739 Hyperglycemia, unspecified: Secondary | ICD-10-CM | POA: Diagnosis not present

## 2023-12-07 DIAGNOSIS — I10 Essential (primary) hypertension: Secondary | ICD-10-CM | POA: Diagnosis not present

## 2023-12-07 DIAGNOSIS — R911 Solitary pulmonary nodule: Secondary | ICD-10-CM | POA: Diagnosis not present

## 2023-12-07 DIAGNOSIS — E559 Vitamin D deficiency, unspecified: Secondary | ICD-10-CM | POA: Diagnosis not present

## 2023-12-07 DIAGNOSIS — K219 Gastro-esophageal reflux disease without esophagitis: Secondary | ICD-10-CM

## 2023-12-07 DIAGNOSIS — I6523 Occlusion and stenosis of bilateral carotid arteries: Secondary | ICD-10-CM | POA: Diagnosis not present

## 2023-12-07 NOTE — Assessment & Plan Note (Signed)
 Chronic Multinodular goiter S/p left hemithyroidectomy 03/23/23 TSH 7.63-levothyroxine  dose increased to 50 mcg daily Continue levothyroxine  50 mcg daily Recheck TFTs in 6 weeks

## 2023-12-07 NOTE — Assessment & Plan Note (Signed)
 Chronic LDL improved Lifestyle controlled Regular exercise and healthy diet encouraged CAC score 0-continue lifestyle control

## 2023-12-07 NOTE — Assessment & Plan Note (Signed)
 Chronic Lab Results  Component Value Date   HGBA1C 5.5 11/22/2023   Sugars improved and in the normal range Low sugar / carb diet Stressed regular exercise

## 2023-12-07 NOTE — Assessment & Plan Note (Addendum)
 Chronic Has mild disease CT CAC in 2023 was 0

## 2023-12-07 NOTE — Assessment & Plan Note (Signed)
 Chronic BP controlled Cmp, cbc reviewed-normal Continue hydrochlorothiazide  12.5 mg daily, metoprolol  xl 150 mg bid

## 2023-12-07 NOTE — Assessment & Plan Note (Signed)
Chronic °Continue vitamin D daily °

## 2023-12-13 ENCOUNTER — Encounter: Admitting: Internal Medicine

## 2023-12-14 ENCOUNTER — Inpatient Hospital Stay: Admission: RE | Admit: 2023-12-14 | Discharge: 2023-12-14 | Attending: Internal Medicine

## 2023-12-14 DIAGNOSIS — R911 Solitary pulmonary nodule: Secondary | ICD-10-CM

## 2023-12-17 ENCOUNTER — Ambulatory Visit (HOSPITAL_COMMUNITY)
Admission: RE | Admit: 2023-12-17 | Discharge: 2023-12-17 | Disposition: A | Discharge status: Home / Self Care | Source: Ambulatory Visit | Attending: Obstetrics | Admitting: Obstetrics

## 2023-12-17 DIAGNOSIS — N950 Postmenopausal bleeding: Secondary | ICD-10-CM

## 2023-12-21 ENCOUNTER — Ambulatory Visit: Payer: Self-pay | Admitting: Internal Medicine

## 2023-12-21 ENCOUNTER — Other Ambulatory Visit: Payer: Self-pay | Admitting: Internal Medicine

## 2023-12-21 NOTE — Telephone Encounter (Unsigned)
 Copied from CRM #[id number]. Topic: Clinical - Medication Refill >> Dec 21, 2023 10:29 AM Thersia C wrote: Medication: levothyroxine  (SYNTHROID ) 25 MCG tablet - was told she will send prescription for double the amount - 50 MCG  Has the patient contacted their pharmacy? Yes (Agent: If no, request that the patient contact the pharmacy for the refill. If patient does not wish to contact the pharmacy document the reason why and proceed with request.) (Agent: If yes, when and what did the pharmacy advise?)  This is the patient's preferred pharmacy:  [id number] - [place], [place] - [address] AT [facility] [address] [facility] [address]  [place] [address] Phone: [phone] Fax: [phone]  Is this the correct pharmacy for this prescription? Yes If no, delete pharmacy and type the correct one.   Has the prescription been filled recently? No  Is the patient out of the medication? Yes  Has the patient been seen for an appointment in the last year OR does the patient have an upcoming appointment? Yes  Can we respond through MyChart? Yes  Agent: Please be advised that Rx refills may take up to 3 business days. We ask that you follow-up with your pharmacy.

## 2023-12-22 ENCOUNTER — Telehealth: Payer: Self-pay

## 2023-12-22 ENCOUNTER — Other Ambulatory Visit: Payer: Self-pay

## 2023-12-22 MED ORDER — LEVOTHYROXINE SODIUM 50 MCG PO TABS: 50.0000 ug | ORAL_TABLET | Freq: Every day | ORAL | 3 refills | Status: AC

## 2023-12-22 MED ORDER — LEVOTHYROXINE SODIUM 25 MCG PO TABS: 50.0000 ug | ORAL_TABLET | Freq: Every day | ORAL | 3 refills | Status: DC

## 2023-12-22 NOTE — Telephone Encounter (Signed)
 Rx sent.

## 2023-12-22 NOTE — Telephone Encounter (Signed)
 Copied from CRM #[id number]. Topic: Clinical - Prescription Issue >> Dec 22, 2023  4:05 PM Alexandria E wrote: Reason for CRM: Patient called in stating that the levothyroxine  (SYNTHROID ) 25 MCG tablet got sent in to the pharmacy when patient was told her PCP would send in 50mcg. Patient needs this medication sent in tomorrow at the latest as she leaves out of town on Monday 12/22.

## 2023-12-22 NOTE — Addendum Note (Signed)
 Addended by: GEOFM GLADE PARAS on: 12/22/2023 04:52 PM   Modules accepted: Orders

## 2024-01-02 ENCOUNTER — Ambulatory Visit: Payer: Self-pay | Admitting: Obstetrics

## 2024-01-13 ENCOUNTER — Encounter: Payer: Self-pay | Admitting: Obstetrics

## 2024-01-13 ENCOUNTER — Ambulatory Visit: Admitting: Obstetrics

## 2024-01-13 VITALS — BP 122/63 | HR 63

## 2024-01-13 DIAGNOSIS — N952 Postmenopausal atrophic vaginitis: Secondary | ICD-10-CM

## 2024-01-13 DIAGNOSIS — N950 Postmenopausal bleeding: Secondary | ICD-10-CM | POA: Diagnosis not present

## 2024-01-13 MED ORDER — ESTRADIOL 0.01 % VA CREA: TOPICAL_CREAM | VAGINAL | 3 refills | Status: AC

## 2024-01-13 NOTE — Assessment & Plan Note (Addendum)
-   For symptomatic vaginal atrophy options include lubrication with a water-based lubricant, personal hygiene measures and barrier protection against wetness, and estrogen replacement in the form of vaginal cream, vaginal tablets, or a time-released vaginal ring.   - continue vaginal estrogen 0.5g 2-3x/week due to symptomatic relief with Rx sent for refills - encouraged barrier ointment as needed for comfort

## 2024-01-13 NOTE — Assessment & Plan Note (Addendum)
-   negative hematuria workup with negative cystoscopy 07/12/23, CT urogram with left kidney interpolar cortical lesion, too small to characterize, possibly a small cyst - TVUS 10/29/23 with uterus 7.0 x 2.9 x 3.3 cm and fluid in the LUS with 3.25mm (not cm per review with Dr. Shoshana) endometrial stripe.  - Transabdominal US  12/28/23 with uterus 7.8 x 3.0 x 3.8 cm and 3mm ES. - recommended EMB needed if recurrence of vaginal bleeding despite negative imaging - referral to GYN to establish care

## 2024-01-13 NOTE — Progress Notes (Signed)
 [facility] Return Visit  SUBJECTIVE  History of Present Illness: Kimberly Stephenson is a 79 y.o. female seen in follow-up for urinary frequency and vaginal atrophy. Plan at last visit was continue vaginal estrogen and proceed with CT urogram.   Denies discomfort during TVUS.  Denies vaginal bleeding.  Continues vaginal estrogen Reports plans to continue reducing metoprolol  and possibly hydrochlorothiazide  Increased thyroid  medication  Day time voids 5-7x/day down from 6-10x/day, voids 1-2x/week at night Using 0.5g vaginal estrogen 2-3x/week   Abdominal US  12/28/23 CLINICAL DATA:  History of postmenopausal bleeding. Follow-up ultrasound.   EXAM: TRANSABDOMINAL ULTRASOUND OF PELVIS   TECHNIQUE: Transabdominal ultrasound examination of the pelvis was performed including evaluation of the uterus, ovaries, adnexal regions, and pelvic cul-de-sac.   COMPARISON:  Ultrasound dated 10/25/2023.   FINDINGS: Uterus   Measurements: 7.8 x 3.0 x 3.8 cm = volume: 46 mL. No fibroids or other mass visualized.   Endometrium   Thickness: 3 mm. The endometrium is poorly visualized and suboptimally evaluated. No focal abnormality noted in the visualized endometrium.   Right ovary   A hypoechoic ovoid structure measuring 1.8 x 1.2 x 1.8 cm in the right pelvis likely represents the right ovary. The volume is 1.9 cc. The ovary appears unremarkable.   Left ovary   Not visualized.   Other findings:  No abnormal free fluid.   IMPRESSION: 1. Unremarkable uterus and the right ovary. 2. Nonvisualization of the left ovary.     Electronically Signed   By: Vanetta Chou M.D.   On: 12/28/2023 13:35    TVUS 10/29/23 EXAM: PELVIC ULTRASOUND   TECHNIQUE: Transvaginal pelvic duplex ultrasound using B-mode/gray scaled imaging without Doppler spectral analysis and color flow was obtained. The patient had difficulty tolerating the exam.   COMPARISON: None provided    CLINICAL HISTORY: postmenopausal bleeding.   FINDINGS:   ULTRASOUND FINDINGS:   UTERUS: Uterus is anteverted measuring 7.0 x 2.9 x 3.3 cm. Estimated volume is 34.7 ml. Fluid is present within the lower uterine segment. The upper portion of the uterus is poorly visualized.   ENDOMETRIAL STRIPE: Endometrium measures 3.9 cm.   RIGHT OVARY:. Right ovary was not visualized.   LEFT OVARY: Left ovary was not visualized.   FREE FLUID: No free fluid.   IMPRESSION: 1. Limited evaluation due to patient discomfort and poor visualization of the upper uterus and ovaries. 2. Fluid within the lower uterine segment could represent hemorrhage. Recommend transabdominal pelvic ultrasound or sonohysterogram for further evaluation.   Electronically signed by: Lonni Necessary MD 11/27/2023 03:52 PM EST RP Workstation: [id number]   Prior visit: Cystoscopy negative 07/12/23, CT urogram with left kidney interpolar cortical lesion, too small to characterize, possibly a small cyst Using vaginal estrogen 1g 2-3x/week Reports pink tinged urine at the end of the day when she wipes. Denies fever, chills, one sided back pain. Denies blood in the toilet.  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  08/16/23 urine negative for heme or infection.  Takes a shower every 1-2 days instead of 3-4 days.   Day time voids reduced to 4-5x/day down from 6-10x/day Underwent [facility] 03/20[facility] without complications.   CT urogram 07/13/23 CLINICAL DATA:  Gross hematuria   EXAM:  CT ABDOMEN AND PELVIS WITHOUT AND WITH CONTRAST   TECHNIQUE: Multidetector CT imaging of the abdomen and pelvis was performed following the standard protocol  before and following the bolus administration of intravenous contrast.   RADIATION DOSE REDUCTION: This exam was performed according to the departmental dose-optimization program which includes automated exposure control, adjustment of the mA and/or kV according to patient size and/or use of iterative reconstruction technique.   CONTRAST:  OMNIPAQUE  IOHEXOL  300 MG/ML  SOLN   COMPARISON:  None Available.   FINDINGS: Lower chest: No acute abnormality.   Hepatobiliary: Subcentimeter hypodensities in hepatic dome and left lobe, too small to characterize likely representing small cysts. No gallstones, gallbladder wall thickening, or biliary dilatation.   Pancreas: Unremarkable. No pancreatic ductal dilatation or surrounding inflammatory changes.   Spleen: Normal in size without focal abnormality. Small fat containing   Adrenals/Urinary Tract: Adrenal glands are unremarkable.   Hypodense left kidney cortical lesion measuring 5 mm in interpolar region of the medial cortex (9/58), too small to characterize. Otherwise kidneys are normal, without renal calculi, or hydronephrosis. There is an intravesical air bubble within the bladder dome measuring 1.7 cm (prior instrumentation?). No bladder stone or mass lesion identified.   Stomach/Bowel: Stomach is within normal limits. No bowel obstruction. No evidence of bowel wall thickening, distention, or inflammatory changes.   Vascular/Lymphatic: Aortic atherosclerosis. No enlarged abdominal or pelvic lymph nodes.   Reproductive: Subcentimeter calcified uterine fibroid. No adnexal mass.   Other: Umbilical hernia with a defect measuring 1 cm.   Musculoskeletal: Degenerative changes of the spine and bilateral hip joints.   IMPRESSION: Intravesical air which may correlate to recent instrumentation. Correlate with clinical findings. No nephrolithiasis or hydronephrosis. Recommend ultrasound for further assessment.    Subcentimeter left kidney interpolar cortical lesion, too small to characterize, possibly a small cyst. This finding does not require imaging follow-up according to ACR guidelines however in the presence of persistent hematuria recommend follow-up if clinically warranted.   Uterine fibroid.   Query hepatic cysts.     Electronically Signed   By: Megan  Zare M.D.   On: 07/14/2023 18:02  Past Medical History: Patient  has a past medical history of Amaurosis fugax, Colitis, ischemic (2010), Irritable bowel syndrome, Milk intolerance, Nonspecific elevation of levels of transaminase or lactic acid dehydrogenase (LDH), Other and unspecified hyperlipidemia, Other and unspecified ovarian cyst, Polydipsia, Rosacea, Sebaceous cyst, and Unspecified essential hypertension.   Past Surgical History: She  has a past surgical history that includes Cesarean section; Tonsillectomy and adenoidectomy; Peri ovarian cyst right resected (1980); Appendectomy (1980); Colonoscopy (2003, 2008); and Upper gastrointestinal endoscopy (1986).   Medications: She has a current medication list which includes the following prescription(s): aspirin -acetaminophen-caffeine, vitamin d3, diphenhydramine hcl, estradiol , hydrochlorothiazide , ketoconazole, levocetirizine, levothyroxine , lorazepam , metoprolol  succinate, multiple vitamins-minerals, and zylet.   Allergies: Patient is allergic to cephalosporins, lamisil [terbinafine], sulfonamide derivatives, simvastatin, ezetimibe, and ramipril .   Social History: Patient  reports that she quit smoking about 55 years ago. Her smoking use included cigarettes. She has never used smokeless tobacco. She reports that she does not drink alcohol and does not use drugs.     OBJECTIVE     Physical Exam: Vitals:   01/13/24 1306  BP: 122/63  Pulse: 63    Gen: No apparent distress, A&O x 3.  Lab Results  Component Value Date   CREATININE 0.76 11/22/2023     ASSESSMENT AND  PLAN    Ms. Hemmingway is a 79 y.o. with:  1.  Vaginal atrophy   2. Postmenopausal bleeding     Vaginal atrophy Assessment & Plan: - For symptomatic vaginal atrophy options include lubrication with a water-based lubricant, personal hygiene measures and barrier protection against wetness, and estrogen replacement in the form of vaginal cream, vaginal tablets, or a time-released vaginal ring.   - continue vaginal estrogen 0.5g 2-3x/week due to symptomatic relief with Rx sent for refills - encouraged barrier ointment as needed for comfort  Orders: -     Estradiol ; Insert into vagina 0.5g 2-3x/week  Dispense: 42.5 g; Refill: 3 -     Ambulatory referral to Gynecology  Postmenopausal bleeding Assessment & Plan: - negative hematuria workup with negative cystoscopy 07/12/23, CT urogram with left kidney interpolar cortical lesion, too small to characterize, possibly a small cyst - TVUS 10/29/23 with uterus 7.0 x 2.9 x 3.3 cm and fluid in the LUS with 3.59mm (not cm per review with Dr. Shoshana) endometrial stripe.  - Transabdominal US  12/28/23 with uterus 7.8 x 3.0 x 3.8 cm and 3mm ES. - recommended EMB needed if recurrence of vaginal bleeding despite negative imaging - referral to GYN to establish care    Kimberly ONEIDA Gillis, MD

## 2024-01-13 NOTE — Patient Instructions (Addendum)
 Continue vaginal estrogen 0.5g 2-3x week.   Please call if you experience any change in urinary or vaginal symptoms.

## 2024-01-16 ENCOUNTER — Encounter: Payer: Self-pay | Admitting: *Deleted

## 2024-01-26 ENCOUNTER — Other Ambulatory Visit

## 2024-01-26 DIAGNOSIS — E890 Postprocedural hypothyroidism: Secondary | ICD-10-CM | POA: Diagnosis not present

## 2024-01-26 DIAGNOSIS — R319 Hematuria, unspecified: Secondary | ICD-10-CM

## 2024-01-26 LAB — T4, FREE: Free T4: 1.13 ng/dL (ref 0.60–1.60)

## 2024-01-26 LAB — TSH: TSH: 1.34 u[IU]/mL (ref 0.35–5.50)

## 2024-01-26 LAB — T3, FREE: T3, Free: 2.8 pg/mL (ref 2.3–4.2)

## 2024-02-02 ENCOUNTER — Other Ambulatory Visit: Payer: Self-pay | Admitting: Internal Medicine

## 2024-12-06 ENCOUNTER — Ambulatory Visit

## 2024-12-13 ENCOUNTER — Ambulatory Visit

## 2024-12-13 ENCOUNTER — Encounter: Admitting: Internal Medicine

## 2024-12-14 ENCOUNTER — Encounter: Admitting: Internal Medicine
